# Patient Record
Sex: Female | Born: 1960 | Race: White | Hispanic: No | Marital: Married | Smoking: Never smoker
Health system: Southern US, Community
[De-identification: ages and names within clinical notes are randomized; demographics above are authoritative.]

## PROBLEM LIST (undated history)

## (undated) DIAGNOSIS — H532 Diplopia: Secondary | ICD-10-CM

## (undated) DIAGNOSIS — I10 Essential (primary) hypertension: Secondary | ICD-10-CM

## (undated) DIAGNOSIS — R87619 Unspecified abnormal cytological findings in specimens from cervix uteri: Secondary | ICD-10-CM

## (undated) DIAGNOSIS — R51 Headache: Secondary | ICD-10-CM

## (undated) DIAGNOSIS — R519 Headache, unspecified: Secondary | ICD-10-CM

## (undated) DIAGNOSIS — G43109 Migraine with aura, not intractable, without status migrainosus: Secondary | ICD-10-CM

## (undated) DIAGNOSIS — K5792 Diverticulitis of intestine, part unspecified, without perforation or abscess without bleeding: Secondary | ICD-10-CM

## (undated) DIAGNOSIS — E785 Hyperlipidemia, unspecified: Secondary | ICD-10-CM

## (undated) HISTORY — PX: ABDOMINAL HYSTERECTOMY: SHX81

## (undated) HISTORY — PX: BREAST SURGERY: SHX581

## (undated) HISTORY — PX: CERVICAL CONE BIOPSY: SUR198

## (undated) HISTORY — PX: REDUCTION MAMMAPLASTY: SUR839

## (undated) HISTORY — DX: Unspecified abnormal cytological findings in specimens from cervix uteri: R87.619

## (undated) HISTORY — PX: OTHER SURGICAL HISTORY: SHX169

## (undated) HISTORY — PX: TONSILLECTOMY: SUR1361

## (undated) HISTORY — DX: Headache, unspecified: R51.9

## (undated) HISTORY — DX: Diverticulitis of intestine, part unspecified, without perforation or abscess without bleeding: K57.92

## (undated) HISTORY — DX: Essential (primary) hypertension: I10

## (undated) HISTORY — PX: TUBAL LIGATION: SHX77

## (undated) HISTORY — DX: Diplopia: H53.2

## (undated) HISTORY — DX: Headache: R51

## (undated) HISTORY — DX: Migraine with aura, not intractable, without status migrainosus: G43.109

## (undated) HISTORY — DX: Hyperlipidemia, unspecified: E78.5

---

## 1998-02-27 ENCOUNTER — Other Ambulatory Visit: Admission: RE | Admit: 1998-02-27 | Discharge: 1998-02-27 | Payer: Self-pay | Admitting: Obstetrics and Gynecology

## 1998-03-11 ENCOUNTER — Ambulatory Visit (HOSPITAL_COMMUNITY): Admission: RE | Admit: 1998-03-11 | Discharge: 1998-03-11 | Payer: Self-pay | Admitting: Obstetrics and Gynecology

## 1998-03-11 ENCOUNTER — Encounter: Payer: Self-pay | Admitting: Obstetrics and Gynecology

## 1998-03-20 ENCOUNTER — Ambulatory Visit (HOSPITAL_COMMUNITY): Admission: RE | Admit: 1998-03-20 | Discharge: 1998-03-20 | Payer: Self-pay | Admitting: Obstetrics and Gynecology

## 1998-06-09 ENCOUNTER — Inpatient Hospital Stay (HOSPITAL_COMMUNITY): Admission: RE | Admit: 1998-06-09 | Discharge: 1998-06-11 | Payer: Self-pay | Admitting: Obstetrics and Gynecology

## 1999-02-17 ENCOUNTER — Encounter: Payer: Self-pay | Admitting: Gastroenterology

## 1999-02-17 ENCOUNTER — Observation Stay (HOSPITAL_COMMUNITY): Admission: AD | Admit: 1999-02-17 | Discharge: 1999-02-18 | Payer: Self-pay | Admitting: Gastroenterology

## 2001-03-16 ENCOUNTER — Other Ambulatory Visit: Admission: RE | Admit: 2001-03-16 | Discharge: 2001-03-16 | Payer: Self-pay | Admitting: Obstetrics and Gynecology

## 2001-10-31 ENCOUNTER — Encounter: Admission: RE | Admit: 2001-10-31 | Discharge: 2001-10-31 | Payer: Self-pay | Admitting: Obstetrics and Gynecology

## 2001-10-31 ENCOUNTER — Encounter: Payer: Self-pay | Admitting: Obstetrics and Gynecology

## 2002-10-02 ENCOUNTER — Encounter: Payer: Self-pay | Admitting: Diagnostic Radiology

## 2002-10-02 ENCOUNTER — Encounter: Admission: RE | Admit: 2002-10-02 | Discharge: 2002-10-02 | Payer: Self-pay | Admitting: Diagnostic Radiology

## 2003-05-15 ENCOUNTER — Encounter: Admission: RE | Admit: 2003-05-15 | Discharge: 2003-05-15 | Payer: Self-pay | Admitting: Dermatology

## 2003-12-24 ENCOUNTER — Encounter: Admission: RE | Admit: 2003-12-24 | Discharge: 2003-12-24 | Payer: Self-pay | Admitting: Orthopedic Surgery

## 2004-01-09 ENCOUNTER — Ambulatory Visit (HOSPITAL_BASED_OUTPATIENT_CLINIC_OR_DEPARTMENT_OTHER): Admission: RE | Admit: 2004-01-09 | Discharge: 2004-01-09 | Payer: Self-pay | Admitting: Orthopedic Surgery

## 2004-04-06 ENCOUNTER — Encounter: Admission: RE | Admit: 2004-04-06 | Discharge: 2004-04-06 | Payer: Self-pay | Admitting: Obstetrics and Gynecology

## 2004-04-08 ENCOUNTER — Encounter (INDEPENDENT_AMBULATORY_CARE_PROVIDER_SITE_OTHER): Payer: Self-pay | Admitting: *Deleted

## 2004-04-08 ENCOUNTER — Encounter: Admission: RE | Admit: 2004-04-08 | Discharge: 2004-04-08 | Payer: Self-pay | Admitting: Obstetrics and Gynecology

## 2005-03-10 ENCOUNTER — Encounter: Admission: RE | Admit: 2005-03-10 | Discharge: 2005-03-10 | Payer: Self-pay | Admitting: Dermatology

## 2005-04-14 ENCOUNTER — Encounter: Admission: RE | Admit: 2005-04-14 | Discharge: 2005-04-14 | Payer: Self-pay | Admitting: Dermatology

## 2005-04-20 ENCOUNTER — Encounter: Admission: RE | Admit: 2005-04-20 | Discharge: 2005-04-20 | Payer: Self-pay | Admitting: Dermatology

## 2005-04-28 ENCOUNTER — Encounter: Admission: RE | Admit: 2005-04-28 | Discharge: 2005-04-28 | Payer: Self-pay | Admitting: Internal Medicine

## 2005-05-12 ENCOUNTER — Encounter: Admission: RE | Admit: 2005-05-12 | Discharge: 2005-05-12 | Payer: Self-pay | Admitting: Dermatology

## 2006-05-10 ENCOUNTER — Encounter: Admission: RE | Admit: 2006-05-10 | Discharge: 2006-05-10 | Payer: Self-pay | Admitting: Internal Medicine

## 2006-05-30 ENCOUNTER — Other Ambulatory Visit: Admission: RE | Admit: 2006-05-30 | Discharge: 2006-05-30 | Payer: Self-pay | Admitting: Internal Medicine

## 2006-05-30 LAB — HM PAP SMEAR

## 2007-05-15 ENCOUNTER — Encounter: Admission: RE | Admit: 2007-05-15 | Discharge: 2007-05-15 | Payer: Self-pay | Admitting: Internal Medicine

## 2008-05-21 ENCOUNTER — Encounter: Admission: RE | Admit: 2008-05-21 | Discharge: 2008-05-21 | Payer: Self-pay | Admitting: Internal Medicine

## 2008-05-22 ENCOUNTER — Ambulatory Visit: Payer: Self-pay | Admitting: Internal Medicine

## 2008-07-24 ENCOUNTER — Ambulatory Visit: Payer: Self-pay | Admitting: Internal Medicine

## 2008-11-24 ENCOUNTER — Telehealth: Payer: Self-pay | Admitting: Cardiology

## 2008-11-26 ENCOUNTER — Telehealth (INDEPENDENT_AMBULATORY_CARE_PROVIDER_SITE_OTHER): Payer: Self-pay | Admitting: *Deleted

## 2008-11-27 ENCOUNTER — Ambulatory Visit: Payer: Self-pay | Admitting: Cardiology

## 2008-11-27 ENCOUNTER — Encounter: Payer: Self-pay | Admitting: Cardiology

## 2008-11-27 DIAGNOSIS — E782 Mixed hyperlipidemia: Secondary | ICD-10-CM | POA: Insufficient documentation

## 2008-11-28 LAB — CONVERTED CEMR LAB
ALT: 18 units/L (ref 0–35)
AST: 23 units/L (ref 0–37)
Albumin: 4.5 g/dL (ref 3.5–5.2)
Alkaline Phosphatase: 55 units/L (ref 39–117)
BUN: 13 mg/dL (ref 6–23)
Basophils Absolute: 0 10*3/uL (ref 0.0–0.1)
Basophils Relative: 0.7 % (ref 0.0–3.0)
Bilirubin, Direct: 0.1 mg/dL (ref 0.0–0.3)
CK-MB: 0.9 ng/mL (ref 0.3–4.0)
CO2: 28 meq/L (ref 19–32)
Calcium: 9.8 mg/dL (ref 8.4–10.5)
Chloride: 108 meq/L (ref 96–112)
Cholesterol: 215 mg/dL — ABNORMAL HIGH (ref 0–200)
Creatinine, Ser: 0.9 mg/dL (ref 0.4–1.2)
Direct LDL: 130.7 mg/dL
Eosinophils Absolute: 0 10*3/uL (ref 0.0–0.7)
Eosinophils Relative: 0.8 % (ref 0.0–5.0)
GFR calc non Af Amer: 71.08 mL/min (ref >60)
Glucose, Bld: 109 mg/dL — ABNORMAL HIGH (ref 70–99)
HCT: 40.4 % (ref 36.0–46.0)
HDL: 65.5 mg/dL (ref >39.00)
Hemoglobin: 13.8 g/dL (ref 12.0–15.0)
Lymphocytes Relative: 27.8 % (ref 12.0–46.0)
Lymphs Abs: 1.4 10*3/uL (ref 0.7–4.0)
MCHC: 34.1 g/dL (ref 30.0–36.0)
MCV: 92.1 fL (ref 78.0–100.0)
Monocytes Absolute: 0.3 10*3/uL (ref 0.1–1.0)
Monocytes Relative: 6.3 % (ref 3.0–12.0)
Neutro Abs: 3.2 10*3/uL (ref 1.4–7.7)
Neutrophils Relative %: 64.4 % (ref 43.0–77.0)
Platelets: 149 10*3/uL — ABNORMAL LOW (ref 150.0–400.0)
Potassium: 4.7 meq/L (ref 3.5–5.1)
RBC: 4.39 M/uL (ref 3.87–5.11)
RDW: 12.6 % (ref 11.5–14.6)
Sed Rate: 16 mm/hr (ref 0–22)
Sodium: 143 meq/L (ref 135–145)
TSH: 2.33 microintl units/mL (ref 0.35–5.50)
Total Bilirubin: 1.1 mg/dL (ref 0.3–1.2)
Total CHOL/HDL Ratio: 3
Total Protein: 7.2 g/dL (ref 6.0–8.3)
Triglycerides: 133 mg/dL (ref 0.0–149.0)
VLDL: 26.6 mg/dL (ref 0.0–40.0)
WBC: 4.9 10*3/uL (ref 4.5–10.5)

## 2008-12-08 ENCOUNTER — Encounter: Payer: Self-pay | Admitting: Cardiology

## 2008-12-08 ENCOUNTER — Ambulatory Visit: Payer: Self-pay

## 2009-02-27 ENCOUNTER — Ambulatory Visit: Payer: Self-pay | Admitting: Internal Medicine

## 2009-04-09 ENCOUNTER — Ambulatory Visit: Payer: Self-pay | Admitting: Internal Medicine

## 2009-04-09 ENCOUNTER — Encounter: Admission: RE | Admit: 2009-04-09 | Discharge: 2009-04-09 | Payer: Self-pay | Admitting: Neurology

## 2009-05-11 ENCOUNTER — Ambulatory Visit: Payer: Self-pay | Admitting: Internal Medicine

## 2009-05-27 ENCOUNTER — Encounter: Admission: RE | Admit: 2009-05-27 | Discharge: 2009-05-27 | Payer: Self-pay | Admitting: Internal Medicine

## 2009-05-27 LAB — HM MAMMOGRAPHY

## 2009-08-17 ENCOUNTER — Ambulatory Visit: Payer: Self-pay | Admitting: Internal Medicine

## 2009-09-08 ENCOUNTER — Ambulatory Visit: Payer: Self-pay | Admitting: Internal Medicine

## 2009-10-05 ENCOUNTER — Ambulatory Visit: Payer: Self-pay | Admitting: Internal Medicine

## 2010-01-11 ENCOUNTER — Ambulatory Visit: Payer: Self-pay | Admitting: Vascular Surgery

## 2010-05-13 ENCOUNTER — Ambulatory Visit: Admit: 2010-05-13 | Payer: Self-pay | Admitting: Vascular Surgery

## 2010-09-14 NOTE — Consult Note (Signed)
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  She has had  previous procedure performed at [facility] by Dr. Ruel Favors  approximately 3 years ago.  I am not certain exactly what procedure was  performed but it sounds like a laser ablation of the left small  saphenous vein.  She has had worsening of her varicosities recently both  in the left mid calf area which is where the majority of her pain is  located but also has had some prominent varicosities noted in the right  mid calf.  She has had no history of swelling, bleeding, ulceration,  thrombophlebitis, deep venous thrombosis or other complications.  She  does not wear elastic compression stockings and states that elevation of  the legs does improve her symptoms.   CHRONIC MEDICAL PROBLEMS:  1. Hypertension.  2. Hyperlipidemia.  3. Negative for coronary artery disease, diabetes, COPD or stroke.   SOCIAL HISTORY:  She is married, has 2 children.  Does not use tobacco.  Drinks occasional alcohol.   FAMILY HISTORY:  Positive for severe PVD in her brother, coronary artery  disease and diabetes in the father and negative for stroke.   REVIEW OF SYSTEMS:  Totally negative, healthy middle-aged female with no  complaints.   PHYSICAL EXAM:  Vital signs:  Blood pressure was 142/99, heart rate 81,  respirations 24.  General:  She is a well-developed, well-nourished  female in no apparent stress, alert and oriented x3.  HEENT:  Exam is  normal.  EOMs intact.  Lungs:  Clear to auscultation.  No rhonchi or  wheezing.  Cardiovascular:  Regular rhythm.  No murmurs.  Carotid pulses  3+, no bruits.  Abdomen:  Soft, nontender with no masses.  Musculoskeletal:  Exam is free of major deformities.   Neurologic:  Normal.  Skin:  Free of rashes.  Extremities:  Lower extremity exam  reveals 2 bulging varicosities over the great saphenous system between  the knee and the ankle each measuring about 0.5 to 1 cm in diameter.  Left leg has some bulging varicosities in the posterior aspect of the  left calf.  These appear to be more related to the small saphenous  system.  She also has a reticular vein in the lateral aspect of the left  calf.  There is no hyperpigmentation or edema in either leg.   I have ordered a venous duplex exam which I have reviewed and  interpreted.  She does have reflux in the deep system on the right and  the left, more severe on the right.  She has reflux at the  saphenofemoral junction in both legs but not throughout the great  saphenous systems in both legs.  Both small saphenous veins are free of  reflux.   I do not see any underlying area to treat with laser ablation.  I think  the best treatment at this time would be primary sclerotherapy.  I did  explain to her that she could develop recurrences down the road as she  does have reflux in both saphenofemoral junctions and could develop  reflux throughout the  great saphenous systems at a later date and need  laser ablation.  Presently I would recommend primary sclerotherapy for  these areas and she will consider that.     [name] [name] [place], M.D.  Electronically Signed   JDL/MEDQ  D:  01/11/2010  T:  01/12/2010  Job:  [id number]

## 2010-09-14 NOTE — Procedures (Signed)
LOWER EXTREMITY VENOUS REFLUX EXAM   INDICATION:  Painful varicose veins.   EXAM:  Using color-flow imaging and pulse Doppler spectral analysis, the  right and left common femoral, superficial femoral, popliteal, posterior  tibial, greater and lesser saphenous veins are evaluated.  There is  evidence suggesting deep venous insufficiency in the right and left  lower extremities.   The right and left saphenofemoral junctions are not competent with  Reflux of >592milliseconds. The right and left GSV is competent.   The right and left proximal short saphenous veins demonstrate  competency.   GSV Diameter (used if found to be incompetent only)                                            Right    Left  Proximal Greater Saphenous Vein           0.85 cm  0.50 cm  Proximal-to-mid-thigh                     cm       cm  Mid thigh                                 0.41 cm  cm  Mid-distal thigh                          cm       cm  Distal thigh                              0.46 cm  cm  Knee                                      0.35 cm  cm   IMPRESSION:  1. Right and left greater saphenous veins are not competent with      reflux of >500 milliseconds.  2. The right and left greater saphenous veins are not tortuous.  3. The deep venous system is not competent with Reflux of      >556milliseconds.  4. The right and left lesser saphenous veins are competent.   ___________________________________________  Quita Skye. Hart Rochester, M.D.   EM/MEDQ  D:  01/11/2010  T:  01/11/2010  Job:  [id number]

## 2010-09-17 NOTE — Op Note (Signed)
NAME:  [name] [name], [name] [name]                    ACCOUNT NO.:  [id number]   MEDICAL RECORD NO.:  [id number]                   PATIENT TYPE:  AMB   LOCATION:  DSC                                  FACILITY:  [facility]   PHYSICIAN:  Katy Fitch. Naaman Plummer., M.D.          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   POSTOPERATIVE DIAGNOSIS:  1.  Chronic right lateral elbow pain due to chronic extensor origin      tendonopathy with MRI evidence of significant tendonopathy and partial      rupture of extensor carpi radialis brevis tendon origin and suggestion      of synovitis and early degenerative arthritis radial capitellar      articulation posterolateral right elbow with identification of chronic      extensor carpi radialis brevis tendonopathy with partial avulsion from      common extensor origin and reactive new bone formation at lateral      epicondyle.   OPERATION:  Arthrotomy of right elbow joint with limited synovectomy and  identification of early marginal osteophyte growth on posterior capitellum  at radial capitellar articulation.   SURGEON:  Katy Fitch. Sypher, M.D.   ASSISTANT:  None.   ANESTHESIA:  General by LMA.   ANESTHESIOLOGIST:  Kaylyn Layer. Michelle Piper, M.D.   INDICATIONS FOR PROCEDURE:  [name] [name] is a 50 year old right hand dominant  homemaker who is an active recreational athlete.  She enjoys playing tennis  and golf.  For the past year, she has had intermittent episodes of right  lateral elbow pain.  We saw her for evaluation in December 2004 at  which  time she reported a 3-4 month history of right lateral elbow pain.  She had  all the positive signs of lateral epicondylitis and was advised to begin a  program of stretching and modified lifting activity.  She tried a period of  rest from tennis with partial relief.  She stopped her golf and rested her  elbow through the spring of 2005 and recently, in the summer 2005, attempted  to play golf and tennis again and noted severe elbow pain.  This lead her to  obtain an MRI of her right elbow which is interpreted by Dr. Jerald Kief to  reveal evidence of severe epicondylitis and partial tendon rupture.  She  came for a follow up consultation on December 31, 2003, reporting chronic  disabling elbow pain that had been present for more than 11 months.  We  reviewed her plane films which demonstrated a reactive osteophyte at the  right lateral epicondyle, her clinical  examination revealed signs of severe  pain on palpation at the extensor carpi radialis brevis origin and all  provocative signs of extensor tendonopathy were present.  The MRI did,  indeed, reveal severe epicondylitis in what appeared to be a deep surface  partial avulsion of the extensor carpi radialis brevis tendon proximal to  the capitellum.  Due to her chronic pain, I recommended consideration of  elbow debridement and reattachment of the extensor carpi radialis brevis and  longus after decortication and drilling.  Preoperatively, she was advised of  the potential risks and benefits of surgery.  We cannot guarantee complete  pain relief.  There are minor risks of infection and possible anesthetic  complications although these are clearly less than 1%.  After informed  consent, Ms. Corzine is brought to the operating room at this time  anticipating lateral elbow reconstruction.   PROCEDURE:  [name] is brought to the operating room and placed on  supine position on the operating table.  Following anesthesia consultation   by Dr. Michelle Piper, general anesthesia by LMA was induced.  1 gram Ancef was  administered as an IV prophylactic antibiotic.  The procedure commenced with  exsanguination of the right arm with an Esmarch bandage and inflation of  arterial tourniquet to 230 mmHg.  A 3 cm incision posterior to the  epicondyle overlying the leading edge of the muscle was made.  The  subcutaneous tissues were carefully divided identifying the posterior margin  of the extensor carpi radialis longus.  The fascia was incised and the  extensor carpi radialis longus and brevis muscles were identified.  The  periosteum at the proximal epicondyle was incised with a scalpel and the  extensor carpi radialis longus and brevis origins were elevated with a short  osteotome.  The tendon substance was quite necrotic in the deep portion of  the extensor carpi radialis brevis.  There was fibrillation and joint fluid  present.  The entire common extensor origin was exposed extending anteriorly  to the brachial radialis origin.  The capitellum was identified and the  posterior aspect of the capitellum was notable for a small osteophyte  forming and some reactive synovitis.  The synovitis was removed with a  rongeur.  The capsule of the elbow joint appeared, otherwise, normal.  No  loose bodies or other predicaments were noted.  The epicondyle was then  cleaned of its osteophyte with a small rongeur followed by drilling  approximately 30 times with a 0.035 inch Kirschner wire through the cortex  to access intramedullary blood supply.  The necrotic tendon was debrided  with a rongeur followed by repair of the tendon with a total of three  mattress sutures of 3-0 FiberWire through bone so as to create a broad foot  print of repair of the brevis and longus muscles to the bone.  A finishing  suture of 4-0 FiberWire was used inverting the edge of the repair for smooth contour.  The wound was then thoroughly lavaged with sterile saline  followed  by repair of the skin with subdermal sutures of 4-0 Vicryl and intradermal 3-  0 Prolene with Steri-Strips.  A compressive dressing was applied with a  volar plaster splint on the wrist maintaining the wrist in 40 degrees  dorsiflexion.  The elbow was dressed with sterile gauze, Tegaderm, and an  Ace wrap.  There were no apparent complications.  For aftercare, Ms. Karen  was advised to begin immediate elbow flexion extension exercises.  She will  not do any lifting or activity with her hand for approximately three weeks.  We will remove the plaster splint at approximately ten days and advance to a  wrist forearm splint.  She is given prescriptions of Motrin 600 mg 1 p.o.  q.6h. p.r.n. pain, 20 tablets without refill, also Keflex 500 mg 1 p.o.  q.8h. x 4 days as a prophylactic antibiotic, and Percocet 5 mg 1-2 tablets  p.o. q.4-6h. p.r.n. pain, 30 tablets without refill.                                               Katy Fitch Naaman Plummer., M.D.    RVS/MEDQ  D:  01/09/2004  T:  01/09/2004  Job:  [id number]

## 2010-10-06 ENCOUNTER — Other Ambulatory Visit: Payer: Self-pay | Admitting: Internal Medicine

## 2010-10-06 DIAGNOSIS — Z1231 Encounter for screening mammogram for malignant neoplasm of breast: Secondary | ICD-10-CM

## 2010-10-14 ENCOUNTER — Ambulatory Visit
Admission: RE | Admit: 2010-10-14 | Discharge: 2010-10-14 | Disposition: A | Discharge status: Home / Self Care | Payer: PRIVATE HEALTH INSURANCE | Source: Ambulatory Visit | Attending: Internal Medicine | Admitting: Internal Medicine

## 2010-10-14 ENCOUNTER — Ambulatory Visit: Payer: Self-pay

## 2010-10-14 DIAGNOSIS — Z1231 Encounter for screening mammogram for malignant neoplasm of breast: Secondary | ICD-10-CM

## 2010-10-26 ENCOUNTER — Other Ambulatory Visit: Payer: Self-pay | Admitting: Internal Medicine

## 2010-11-08 ENCOUNTER — Other Ambulatory Visit: Payer: Self-pay | Admitting: Internal Medicine

## 2011-03-21 ENCOUNTER — Other Ambulatory Visit: Payer: Self-pay | Admitting: Internal Medicine

## 2011-06-13 ENCOUNTER — Encounter: Payer: Self-pay | Admitting: Internal Medicine

## 2011-06-14 ENCOUNTER — Ambulatory Visit (INDEPENDENT_AMBULATORY_CARE_PROVIDER_SITE_OTHER): Payer: PRIVATE HEALTH INSURANCE | Admitting: Internal Medicine

## 2011-06-14 ENCOUNTER — Encounter: Payer: Self-pay | Admitting: Internal Medicine

## 2011-06-14 VITALS — BP 148/108 | HR 84 | Temp 98.8°F

## 2011-06-14 DIAGNOSIS — I10 Essential (primary) hypertension: Secondary | ICD-10-CM

## 2011-06-14 DIAGNOSIS — E785 Hyperlipidemia, unspecified: Secondary | ICD-10-CM

## 2011-06-14 DIAGNOSIS — Z8669 Personal history of other diseases of the nervous system and sense organs: Secondary | ICD-10-CM

## 2011-06-14 DIAGNOSIS — F39 Unspecified mood [affective] disorder: Secondary | ICD-10-CM

## 2011-06-14 LAB — COMPREHENSIVE METABOLIC PANEL
AST: 30 U/L (ref 0–37)
Albumin: 4.7 g/dL (ref 3.5–5.2)
Alkaline Phosphatase: 90 U/L (ref 39–117)
Chloride: 102 mEq/L (ref 96–112)
Glucose, Bld: 92 mg/dL (ref 70–99)
Potassium: 4.9 mEq/L (ref 3.5–5.3)
Sodium: 139 mEq/L (ref 135–145)
Total Protein: 6.7 g/dL (ref 6.0–8.3)

## 2011-06-14 LAB — CBC WITH DIFFERENTIAL/PLATELET
Basophils Absolute: 0 10*3/uL (ref 0.0–0.1)
Basophils Relative: 1 % (ref 0–1)
Hemoglobin: 13.6 g/dL (ref 12.0–15.0)
Lymphocytes Relative: 22 % (ref 12–46)
MCHC: 34.3 g/dL (ref 30.0–36.0)
Monocytes Relative: 9 % (ref 3–12)
Neutro Abs: 3.5 10*3/uL (ref 1.7–7.7)
Neutrophils Relative %: 67 % (ref 43–77)
RBC: 4.31 MIL/uL (ref 3.87–5.11)
WBC: 5.2 10*3/uL (ref 4.0–10.5)

## 2011-06-14 LAB — LIPID PANEL
LDL Cholesterol: 183 mg/dL — ABNORMAL HIGH (ref 0–99)
VLDL: 35 mg/dL (ref 0–40)

## 2011-06-15 ENCOUNTER — Other Ambulatory Visit: Payer: Self-pay | Admitting: Internal Medicine

## 2011-06-24 ENCOUNTER — Ambulatory Visit (INDEPENDENT_AMBULATORY_CARE_PROVIDER_SITE_OTHER): Payer: PRIVATE HEALTH INSURANCE | Admitting: Internal Medicine

## 2011-06-24 ENCOUNTER — Encounter: Payer: Self-pay | Admitting: Internal Medicine

## 2011-06-24 DIAGNOSIS — F419 Anxiety disorder, unspecified: Secondary | ICD-10-CM

## 2011-06-24 DIAGNOSIS — I10 Essential (primary) hypertension: Secondary | ICD-10-CM

## 2011-06-24 DIAGNOSIS — F411 Generalized anxiety disorder: Secondary | ICD-10-CM

## 2011-06-24 DIAGNOSIS — F39 Unspecified mood [affective] disorder: Secondary | ICD-10-CM

## 2011-06-24 NOTE — Progress Notes (Signed)
  Subjective:    Patient ID: [name] [name], female    DOB: [birth year], 51 y.o.   MRN: [id number]  HPI Her sister is dying of metastatic bile duct cancer. She is going to have to leave right away to travel to [place] to drive her back to [place]. She has gotten acutely deal. Her blood pressure has improved since starting Benicar. Lipids remain high. 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 Doesn't tolerate statin medication well. Gave her samples of Crestor 5 mg daily to try. Suggested she return in 3 months to have fasting lipid panel liver functions if she can tolerate Crestor.    Review of Systems     Objective:   Physical Exam chest clear to auscultation; cardiac exam regular rate and rhythm; extremities without edema        Assessment & Plan:  Hypertension  Hyperlipidemia  Anxiety  Plan: Xanax 0.5 mg #60 one half to one by mouth twice daily as needed for anxiety continue Benicar  Try Crestor 5 mg daily return in 3 months for lipid panel, liver functions, office visit. Will not do lab work if she is unable to tolerate Crestor and is not taking it.

## 2011-07-03 DIAGNOSIS — I10 Essential (primary) hypertension: Secondary | ICD-10-CM | POA: Insufficient documentation

## 2011-07-03 DIAGNOSIS — F39 Unspecified mood [affective] disorder: Secondary | ICD-10-CM | POA: Insufficient documentation

## 2011-07-03 DIAGNOSIS — Z8669 Personal history of other diseases of the nervous system and sense organs: Secondary | ICD-10-CM | POA: Insufficient documentation

## 2011-07-03 NOTE — Progress Notes (Signed)
  Subjective:    Patient ID: [name] [name], female    DOB: [birth year], 51 y.o.   MRN: [id number]  HPI Patient called a few days ago and said her blood pressure had been markedly elevated. She used to take Norvasc but had stopped taking it for a while. We've not seen her in some time. Patient has a history of hyperlipidemia but is unable to tolerate lipid-lowering medication. Patient's sister has metastatic cancer which may have started in the bile duct. This is been stressful for her.    Review of Systems     Objective:   Physical Exam funduscopic exam shows a disc be sharp and flat bilaterally. Neck is supple without JVD thyromegaly or carotid bruits. Chest clear to auscultation. Cardiac exam regular rate and rhythm normal S1 and S2. Extremities without edema.        Assessment & Plan:   Hypertension  History of hyperlipidemia-intolerant of statin medication  History of mood disorder  History of migraine headaches  Plan: Patient recently started Benicar. Is to return in 2 weeks for followup.

## 2011-07-03 NOTE — Patient Instructions (Signed)
Take Benicar daily. Return in 2 weeks.

## 2011-07-10 DIAGNOSIS — F419 Anxiety disorder, unspecified: Secondary | ICD-10-CM | POA: Insufficient documentation

## 2011-07-10 NOTE — Patient Instructions (Signed)
Take Xanax as needed for anxiety. Continue with Benicar daily. Try Crestor 5 mg daily. Return in 3 months. If you are taking Crestor in 3 months with compliance we will do fasting lipid panel liver functions when he returned.

## 2011-08-01 ENCOUNTER — Encounter: Payer: Self-pay | Admitting: Internal Medicine

## 2011-09-22 ENCOUNTER — Other Ambulatory Visit: Payer: PRIVATE HEALTH INSURANCE | Admitting: Internal Medicine

## 2011-09-23 ENCOUNTER — Ambulatory Visit: Payer: PRIVATE HEALTH INSURANCE | Admitting: Internal Medicine

## 2011-10-10 ENCOUNTER — Other Ambulatory Visit: Payer: Self-pay | Admitting: Internal Medicine

## 2011-10-10 ENCOUNTER — Other Ambulatory Visit: Payer: PRIVATE HEALTH INSURANCE | Admitting: Internal Medicine

## 2011-10-10 DIAGNOSIS — Z79899 Other long term (current) drug therapy: Secondary | ICD-10-CM

## 2011-10-10 LAB — HEPATIC FUNCTION PANEL
Bilirubin, Direct: 0.1 mg/dL (ref 0.0–0.3)
Total Bilirubin: 0.4 mg/dL (ref 0.3–1.2)

## 2011-10-11 ENCOUNTER — Encounter: Payer: Self-pay | Admitting: Internal Medicine

## 2011-10-11 ENCOUNTER — Ambulatory Visit (INDEPENDENT_AMBULATORY_CARE_PROVIDER_SITE_OTHER): Payer: PRIVATE HEALTH INSURANCE | Admitting: Internal Medicine

## 2011-10-11 VITALS — BP 134/88 | Ht 65.0 in | Wt 158.0 lb

## 2011-10-11 DIAGNOSIS — E785 Hyperlipidemia, unspecified: Secondary | ICD-10-CM

## 2011-10-11 DIAGNOSIS — I10 Essential (primary) hypertension: Secondary | ICD-10-CM

## 2011-10-11 LAB — LIPID PANEL
HDL: 72 mg/dL (ref >39)
LDL Cholesterol: 193 mg/dL — ABNORMAL HIGH (ref 0–99)
Total CHOL/HDL Ratio: 4 Ratio
Triglycerides: 110 mg/dL (ref <150)
VLDL: 22 mg/dL (ref 0–40)

## 2011-10-11 NOTE — Progress Notes (Signed)
  Subjective:    Patient ID: [name] [name], female    DOB: Mar 19, 1961, 51 y.o.   MRN: [id number]  HPI Patient here today to followup on hypertension and hyperlipidemia. Unfortunately her lipid panel results are not back yet. She has a history of hyperlipidemia and is not able to tolerate statin medications. She stopped taking Benicar recently because she read an article in the [employer] saying that there was an 11% increase in malignancies with Benicar. She is only taking amlodipine. Blood pressure continues to be labile at times-- she says it can be as high as 140/90.    Review of Systems     Objective:   Physical Exam chest clear to auscultation; cardiac exam regular rate and rhythm; extremities without edema           Assessment & Plan:  Hypertension-labile currently only taking amlodipine  Hyperlipidemia  Plan: Add Maxide 25 daily to amlodipine. Advised patient of lipid panel results tomorrow. Return in 4-6 weeks.

## 2011-10-11 NOTE — Patient Instructions (Signed)
Add Maxide 25 to amlodipine. Return in 6 weeks. B- met will be drawn at that time.

## 2011-11-29 ENCOUNTER — Ambulatory Visit (INDEPENDENT_AMBULATORY_CARE_PROVIDER_SITE_OTHER): Payer: PRIVATE HEALTH INSURANCE | Admitting: Internal Medicine

## 2011-11-29 VITALS — BP 126/100 | HR 80

## 2011-11-29 DIAGNOSIS — I10 Essential (primary) hypertension: Secondary | ICD-10-CM

## 2011-11-29 LAB — POTASSIUM: Potassium: 5.1 mEq/L (ref 3.5–5.3)

## 2011-11-30 ENCOUNTER — Encounter: Payer: Self-pay | Admitting: Internal Medicine

## 2011-11-30 NOTE — Patient Instructions (Addendum)
Continue Maxide 25 and amlodipine daily. Continue to monitor blood pressure at home. Call if diastolic blood pressure remains elevated over 90 consistently. Otherwise return in 6 months.

## 2011-11-30 NOTE — Progress Notes (Signed)
  Subjective:    Patient ID: [name] [name], female    DOB: 1961/04/22, 51 y.o.   MRN: [id number]  HPI recently seen for hypertension and she wanted to come off of Benicar because she read it could possibly be associated with cancer. Now on diuretic. Wants to have potassium checked. She is on amlodipine 5 mg daily Maxide 25 daily.    Review of Systems     Objective:   Physical Exam blood pressure is elevated today at 100 diastolically. This is unusual for her. Says it's been running quite well at home. Serum potassium drawn and is pending.        Assessment & Plan:  Hypertension-on diuretic therapy  Hyperlipidemia-does not tolerate any lipid lowering agent  Plan: Continue diuretic, serum potassium checked. Continue to monitor blood pressure at home and call if consistently elevated diastolically as it is today.  Addendum: Serum potassium 5.1

## 2012-01-08 ENCOUNTER — Other Ambulatory Visit: Payer: Self-pay | Admitting: Internal Medicine

## 2012-02-10 ENCOUNTER — Other Ambulatory Visit: Payer: Self-pay | Admitting: Internal Medicine

## 2012-03-20 ENCOUNTER — Other Ambulatory Visit: Payer: Self-pay | Admitting: Internal Medicine

## 2012-04-17 ENCOUNTER — Other Ambulatory Visit: Payer: Self-pay | Admitting: Internal Medicine

## 2012-04-17 DIAGNOSIS — Z1231 Encounter for screening mammogram for malignant neoplasm of breast: Secondary | ICD-10-CM

## 2012-05-16 ENCOUNTER — Ambulatory Visit
Admission: RE | Admit: 2012-05-16 | Discharge: 2012-05-16 | Disposition: A | Discharge status: Home / Self Care | Payer: PRIVATE HEALTH INSURANCE | Source: Ambulatory Visit | Attending: Internal Medicine | Admitting: Internal Medicine

## 2012-05-16 DIAGNOSIS — Z1231 Encounter for screening mammogram for malignant neoplasm of breast: Secondary | ICD-10-CM

## 2012-05-22 ENCOUNTER — Other Ambulatory Visit: Payer: Self-pay | Admitting: Internal Medicine

## 2012-05-22 DIAGNOSIS — R928 Other abnormal and inconclusive findings on diagnostic imaging of breast: Secondary | ICD-10-CM

## 2012-05-23 ENCOUNTER — Ambulatory Visit
Admission: RE | Admit: 2012-05-23 | Discharge: 2012-05-23 | Disposition: A | Discharge status: Home / Self Care | Payer: PRIVATE HEALTH INSURANCE | Source: Ambulatory Visit | Attending: Internal Medicine | Admitting: Internal Medicine

## 2012-05-23 ENCOUNTER — Other Ambulatory Visit: Payer: Self-pay | Admitting: Internal Medicine

## 2012-05-23 DIAGNOSIS — R928 Other abnormal and inconclusive findings on diagnostic imaging of breast: Secondary | ICD-10-CM

## 2012-06-04 ENCOUNTER — Telehealth: Payer: Self-pay | Admitting: Internal Medicine

## 2012-06-04 NOTE — Telephone Encounter (Signed)
[facility] and subsequently underwent biopsy which was consistent with sarcoidosis. She called today asking for referral to pulmonologist. She should have chest x-ray which I don't think has been done and CT of the chest with contrast.

## 2012-06-05 ENCOUNTER — Telehealth: Payer: Self-pay

## 2012-06-05 DIAGNOSIS — D869 Sarcoidosis, unspecified: Secondary | ICD-10-CM

## 2012-06-05 NOTE — Telephone Encounter (Signed)
Patient calls stating she had a lymph node biopsy come back with a diagnosis of sarcoidosis. Wants referral to pulmonologist, and has chosen Dr. Danise Mina. Does not want a CT scan of the chest, but has agreed to have a chest x-ray. Referral to be done

## 2012-06-06 ENCOUNTER — Telehealth: Payer: Self-pay

## 2012-06-06 NOTE — Telephone Encounter (Signed)
Patient scheduled for an appointment with Dr. Danise Mina on 07/25/2012 at 3:30pm. She is aware of this. Will do a chest xray prior to this.

## 2012-06-18 ENCOUNTER — Other Ambulatory Visit: Payer: Self-pay | Admitting: Internal Medicine

## 2012-06-18 ENCOUNTER — Telehealth: Payer: Self-pay | Admitting: Critical Care Medicine

## 2012-06-18 NOTE — Telephone Encounter (Signed)
Noted and thanks.

## 2012-06-18 NOTE — Telephone Encounter (Signed)
This patient needs a Consult with me regarding dx of sarcoidosis.    Her husband is one of our radiology colleagues  Work her in some where please

## 2012-06-18 NOTE — Telephone Encounter (Signed)
Called, spoke with pt to schedule an consult with PW.  Pt already has an OV scheduled with PW on July 25, 2012.  I offered to move appt date up, but pt states she is ok with keeping appt in March for now.  I asked her to pls call back if her symptoms worsen or if she decides she would like to be seen sooner.  She verbalized understanding of this, was very appreciative of this offer, and voiced no further questions or concerns at this time.  Will sign off and route to PW as FYI.

## 2012-06-18 NOTE — Telephone Encounter (Signed)
Crystal, pls advise if there is any way to work pt in sooner.

## 2012-07-02 ENCOUNTER — Ambulatory Visit
Admission: RE | Admit: 2012-07-02 | Discharge: 2012-07-02 | Disposition: A | Discharge status: Home / Self Care | Payer: PRIVATE HEALTH INSURANCE | Source: Ambulatory Visit | Attending: Internal Medicine | Admitting: Internal Medicine

## 2012-07-03 NOTE — Telephone Encounter (Signed)
Patient informed. 

## 2012-07-25 ENCOUNTER — Ambulatory Visit (INDEPENDENT_AMBULATORY_CARE_PROVIDER_SITE_OTHER): Payer: PRIVATE HEALTH INSURANCE | Admitting: Critical Care Medicine

## 2012-07-25 ENCOUNTER — Encounter: Payer: Self-pay | Admitting: Critical Care Medicine

## 2012-07-25 VITALS — BP 140/98 | HR 86 | Temp 97.4°F | Ht 65.0 in

## 2012-07-25 DIAGNOSIS — D869 Sarcoidosis, unspecified: Secondary | ICD-10-CM | POA: Insufficient documentation

## 2012-07-25 NOTE — Progress Notes (Signed)
Subjective:    Patient ID: [name] [name], female    DOB: Jul 18, 1960, 52 y.o.   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  Cough Pt had LN bx ?sarcoid.  No other findings.  Routine mammogram. No mammogram findings but axillary LN .  Cyst on breast neg.  No symptoms.  Hx of cough, pt ill with bad URI, did not go away. Now on occasion will cough. Cough comes and goes for two months.  No LAN on CXR. 1-2 cig smoker in college.  Father quit smoking age 69. Ramipril caused cough   Cough This is a recurrent (2 months) problem. Episode onset: started with URI with current cough. The problem has been rapidly improving. The problem occurs every few hours. The cough is non-productive. Associated symptoms include headaches, postnasal drip and a sore throat. Pertinent negatives include no chest pain, chills, ear congestion, ear pain, fever, heartburn, hemoptysis, myalgias, nasal congestion, rhinorrhea, shortness of breath, sweats, weight loss or wheezing. Treatments tried: azithromycin rx for cough helped cough. Her past medical history is significant for bronchitis. There is no history of asthma, bronchiectasis, COPD, emphysema, environmental allergies or pneumonia.    Past Medical History  Diagnosis Date  . Hyperlipidemia   . Hypertension   . GERD (gastroesophageal reflux disease)   . Generalized headaches      Family History  Problem Relation Age of Onset  . Prostate cancer Father   . Heart disease Father   . Hypertension Father   . Hyperlipidemia Sister   . Hypertension Sister   . Alcohol abuse Brother   . Diabetes Brother   . Hypertension Brother   . Renal cancer Sister      History   Social History  . Marital Status: Married    Spouse Name: N/A    Number of Children: N/A  . Years of Education: N/A   Occupational History  . artist    Social History Main Topics  . Smoking status: Never Smoker   . Smokeless tobacco: Never Used  . Alcohol Use: Yes   Comment: socially  . Drug Use: No  . Sexually Active: Yes   Other Topics Concern  . Not on file   Social History Narrative  . No narrative on file     Allergies  Allergen Reactions  . Ramipril Cough     Outpatient Prescriptions Prior to Visit  Medication Sig Dispense Refill  . triamterene-hydrochlorothiazide (MAXZIDE-25) 37.5-25 MG per tablet TAKE 1 TABLET BY MOUTH DAILY  30 tablet  3  . venlafaxine XR (EFFEXOR-XR) 75 MG 24 hr capsule TAKE ONE CAPSULE BY MOUTH DAILY  90 capsule  PRN  . ALPRAZolam (XANAX) 0.5 MG tablet TAKE  1/2 TO 1 TABLET BY MOUTH EVERY NIGHT AT BEDTIME  60 tablet  0  . amLODipine (NORVASC) 5 MG tablet TAKE 1 TABLET BY MOUTH EVERY MORNING  90 tablet  0  . pantoprazole (PROTONIX) 40 MG tablet Take 40 mg by mouth daily.       No facility-administered medications prior to visit.      Review of Systems  Constitutional: Positive for diaphoresis, activity change and unexpected weight change. Negative for fever, chills, weight loss, appetite change and fatigue.  HENT: Positive for sore throat, sneezing, postnasal drip and sinus pressure. Negative for hearing loss, ear pain, nosebleeds, congestion, facial swelling, rhinorrhea, mouth sores, trouble swallowing, neck pain, neck stiffness, dental problem, voice change, tinnitus and ear discharge.   Eyes: Positive for visual disturbance.  Negative for photophobia, discharge and itching.  Respiratory: Positive for cough. Negative for apnea, hemoptysis, choking, chest tightness, shortness of breath, wheezing and stridor.   Cardiovascular: Negative for chest pain, palpitations and leg swelling.  Gastrointestinal: Negative for heartburn, nausea, vomiting, abdominal pain, constipation, blood in stool and abdominal distention.  Genitourinary: Negative for dysuria, urgency, frequency, hematuria, flank pain, decreased urine volume and difficulty urinating.  Musculoskeletal: Negative for myalgias, back pain, joint swelling,  arthralgias and gait problem.  Skin: Negative for color change and pallor.  Allergic/Immunologic: Negative for environmental allergies.  Neurological: Positive for speech difficulty and headaches. Negative for dizziness, tremors, seizures, syncope, weakness, light-headedness and numbness.  Hematological: Negative for adenopathy. Does not bruise/bleed easily.  Psychiatric/Behavioral: Negative for confusion, sleep disturbance and agitation. The patient is not nervous/anxious.        Objective:   Physical Exam Filed Vitals:   07/25/12 1538  BP: 140/98  Pulse: 86  Temp: 97.4 F (36.3 C)  TempSrc: Oral  Height: 5\' 5"  (1.651 m)  SpO2: 98%    Gen: Pleasant, well-nourished, in no distress,  normal affect  ENT: No lesions,  mouth clear,  oropharynx clear, no postnasal drip  Neck: No JVD, no TMG, no carotid bruits  Lungs: No use of accessory muscles, no dullness to percussion, clear without rales or rhonchi  Cardiovascular: RRR, heart sounds normal, no murmur or gallops, no peripheral edema  Abdomen: soft and NT, no HSM,  BS normal  Musculoskeletal: No deformities, no cyanosis or clubbing  Neuro: alert, non focal  Skin: Warm, no lesions or rashes    Chest x-ray is reviewed and shows no active process     Assessment & Plan:   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 No evidence for any other additional systemic lymphadenopathy. No evidence of primary pulmonary airway process. At the very worst this patient likely only has peripheral lymphadenopathy on a unilateral basis in the right axilla do to sarcoidosis. No other plausible explanation for the lymphadenopathy can be made. The patient also has mild elevations in calcium levels which may go along with the lymphadenopathy. At this point systemic steroids did not seem to be a reasonable alternative. Plan Pulmonary function testing CT Chest will be obtained Labs today: CMET,  CBC, Liver function, ACE level, vitamin D level Further treatment and followup will be pending above studies    Updated Medication List Outpatient Encounter Prescriptions as of 07/25/2012  Medication Sig Dispense Refill  . ALPRAZolam (XANAX) 0.5 MG tablet Take 0.25-0.5 mg by mouth at bedtime as needed.       . Probiotic Product (PROBIOTIC PO) Take by mouth as needed.      . triamterene-hydrochlorothiazide (MAXZIDE-25) 37.5-25 MG per tablet TAKE 1 TABLET BY MOUTH DAILY  30 tablet  3  . venlafaxine XR (EFFEXOR-XR) 75 MG 24 hr capsule TAKE ONE CAPSULE BY MOUTH DAILY  90 capsule  PRN  . [DISCONTINUED] ALPRAZolam (XANAX) 0.5 MG tablet TAKE  1/2 TO 1 TABLET BY MOUTH EVERY NIGHT AT BEDTIME  60 tablet  0  . [DISCONTINUED] amLODipine (NORVASC) 5 MG tablet TAKE 1 TABLET BY MOUTH EVERY MORNING  90 tablet  0  . [DISCONTINUED] pantoprazole (PROTONIX) 40 MG tablet Take 40 mg by mouth daily.       No facility-administered encounter medications on file as of 07/25/2012.

## 2012-07-25 NOTE — Patient Instructions (Signed)
Pulmonary function testing CT Chest will be obtained Labs today: CMET, CBC, Liver function, ACE level, vitamin D level I will call you with results and plan

## 2012-07-26 ENCOUNTER — Other Ambulatory Visit (INDEPENDENT_AMBULATORY_CARE_PROVIDER_SITE_OTHER): Payer: PRIVATE HEALTH INSURANCE

## 2012-07-26 ENCOUNTER — Telehealth: Payer: Self-pay | Admitting: Critical Care Medicine

## 2012-07-26 ENCOUNTER — Ambulatory Visit (INDEPENDENT_AMBULATORY_CARE_PROVIDER_SITE_OTHER): Payer: PRIVATE HEALTH INSURANCE | Admitting: Critical Care Medicine

## 2012-07-26 DIAGNOSIS — D869 Sarcoidosis, unspecified: Secondary | ICD-10-CM

## 2012-07-26 LAB — CBC WITH DIFFERENTIAL/PLATELET
Basophils Relative: 0.7 % (ref 0.0–3.0)
Eosinophils Relative: 0.8 % (ref 0.0–5.0)
HCT: 41.2 % (ref 36.0–46.0)
Hemoglobin: 14.3 g/dL (ref 12.0–15.0)
MCV: 92.7 fl (ref 78.0–100.0)
Monocytes Absolute: 0.5 10*3/uL (ref 0.1–1.0)
Neutro Abs: 2.7 10*3/uL (ref 1.4–7.7)
Neutrophils Relative %: 52.6 % (ref 43.0–77.0)
RBC: 4.45 Mil/uL (ref 3.87–5.11)
WBC: 5.2 10*3/uL (ref 4.5–10.5)

## 2012-07-26 LAB — COMPREHENSIVE METABOLIC PANEL
AST: 31 U/L (ref 0–37)
Albumin: 4.5 g/dL (ref 3.5–5.2)
Alkaline Phosphatase: 107 U/L (ref 39–117)
BUN: 15 mg/dL (ref 6–23)
Creatinine, Ser: 0.9 mg/dL (ref 0.4–1.2)
Glucose, Bld: 120 mg/dL — ABNORMAL HIGH (ref 70–99)
Total Bilirubin: 0.7 mg/dL (ref 0.3–1.2)

## 2012-07-26 LAB — PULMONARY FUNCTION TEST

## 2012-07-26 NOTE — Telephone Encounter (Signed)
Ok needed this infor for insurance Tobe Sos

## 2012-07-26 NOTE — Assessment & Plan Note (Signed)
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 No evidence for any other additional systemic lymphadenopathy. No evidence of primary pulmonary airway process. At the very worst this patient likely only has peripheral lymphadenopathy on a unilateral basis in the right axilla do to sarcoidosis. No other plausible explanation for the lymphadenopathy can be made. The patient also has mild elevations in calcium levels which may go along with the lymphadenopathy. At this point systemic steroids did not seem to be a reasonable alternative. Plan Pulmonary function testing CT Chest will be obtained Labs today: CMET, CBC, Liver function, ACE level, vitamin D level Further treatment and followup will be pending above studies

## 2012-07-26 NOTE — Progress Notes (Signed)
PFT done today. 

## 2012-07-30 ENCOUNTER — Ambulatory Visit
Admission: RE | Admit: 2012-07-30 | Discharge: 2012-07-30 | Disposition: A | Discharge status: Home / Self Care | Payer: PRIVATE HEALTH INSURANCE | Source: Ambulatory Visit | Attending: Critical Care Medicine | Admitting: Critical Care Medicine

## 2012-07-30 DIAGNOSIS — D869 Sarcoidosis, unspecified: Secondary | ICD-10-CM

## 2012-07-30 LAB — VITAMIN D 1,25 DIHYDROXY: Vitamin D3 1, 25 (OH)2: 39 pg/mL

## 2012-07-30 MED ORDER — IOHEXOL 300 MG/ML  SOLN
75.0000 mL | Freq: Once | INTRAMUSCULAR | Status: AC | PRN
  Administered 2012-07-30: 75 mL via INTRAVENOUS

## 2012-07-30 NOTE — Progress Notes (Signed)
Quick Note:  lmomtcb for pt ______ 

## 2012-07-30 NOTE — Progress Notes (Signed)
Quick Note:  Call pt and tell her Vitamin D levels are normal. I will call with CT Chest results when available ______

## 2012-07-31 NOTE — Progress Notes (Signed)
Quick Note:  Called, spoke with pt. Informed her Vit D levels are normal per PW. Advised will call back with CT Chest results once available. She verbalized understanding and voiced no further questions or concerns at this time. ______

## 2012-08-01 ENCOUNTER — Other Ambulatory Visit: Payer: PRIVATE HEALTH INSURANCE

## 2012-08-01 ENCOUNTER — Encounter: Payer: Self-pay | Admitting: Critical Care Medicine

## 2012-08-01 NOTE — Progress Notes (Signed)
Quick Note:  Notify the patient that the CT Scan is normal. No sarcoid in lung. No need for ANY treatment. F/u is prn i have tried for three days to call and cannot get cell to answer ______

## 2012-08-02 NOTE — Progress Notes (Signed)
Quick Note:  Called, spoke with pt. Informed her of CT Chest results and recs per Dr. Delford Field. She verbalized understanding of this and voiced no further questions or concerns at this time. ______

## 2012-08-03 ENCOUNTER — Telehealth: Payer: Self-pay | Admitting: Internal Medicine

## 2012-08-03 NOTE — Telephone Encounter (Signed)
Verbal order from Dr. Lenord Fellers to call in Ambien 10 mg #6 1 po qhs, no refill.  Called Rx to [facility] @ [phone]; spoke with Romeo Apple.  Called Jiyah to advise.

## 2012-08-13 ENCOUNTER — Encounter: Payer: Self-pay | Admitting: Critical Care Medicine

## 2012-08-25 ENCOUNTER — Other Ambulatory Visit: Payer: Self-pay | Admitting: Internal Medicine

## 2012-11-14 ENCOUNTER — Other Ambulatory Visit: Payer: Self-pay | Admitting: Internal Medicine

## 2012-11-14 ENCOUNTER — Other Ambulatory Visit: Payer: Self-pay

## 2012-11-30 ENCOUNTER — Encounter: Payer: Self-pay | Admitting: Internal Medicine

## 2012-11-30 ENCOUNTER — Ambulatory Visit (INDEPENDENT_AMBULATORY_CARE_PROVIDER_SITE_OTHER): Payer: Self-pay | Admitting: Internal Medicine

## 2012-11-30 VITALS — BP 136/98 | HR 72 | Temp 98.2°F

## 2012-11-30 DIAGNOSIS — R232 Flushing: Secondary | ICD-10-CM

## 2012-11-30 DIAGNOSIS — I10 Essential (primary) hypertension: Secondary | ICD-10-CM

## 2012-11-30 DIAGNOSIS — N951 Menopausal and female climacteric states: Secondary | ICD-10-CM

## 2012-11-30 MED ORDER — AMLODIPINE BESYLATE 5 MG PO TABS: ORAL_TABLET | ORAL | Status: DC

## 2012-11-30 MED ORDER — VENLAFAXINE HCL ER 150 MG PO CP24: ORAL_CAPSULE | ORAL | Status: DC

## 2012-12-01 NOTE — Patient Instructions (Signed)
Increase Effexor to 150 mg daily. Add amlodipine 5 mg daily to Maxzide 25. Schedule physical exam in the near future with fasting lab work including [facility]

## 2012-12-01 NOTE — Progress Notes (Signed)
  Subjective:    Patient ID: [name] [name], female    DOB: February 22, 1961, 52 y.o.   MRN: [id number]  HPI Patient refuses to weigh today. She has a history of hypertension. At one point was on amlodipine but quit taking it. She then placed herself on Benicar because her sister was on and then she thought it worked well for her sister. However sister died of cancer and patient subsequently read an article same Benicar was harmful so she quit taking that. She is just taking Maxzide 25 now at the present time for hypertension. Says it has "stopped working". She and her husband are building a new house. She's having some hot flashes. Thinks she is menopausal. Has not had physical exam in some time. Needs to make appointment for physical exam and fasting lab work. History of hyperlipidemia and has refused to take lipid-lowering medication saying it caused side effects.    Review of Systems     Objective:   Physical Exam spent with patient speaking about blood pressure control and hot flashes. She is on Effexor 75 mg daily for mood disorder. She has heard that Effexor may help hot flashes. Wants to try increased dose of Effexor.        Assessment & Plan:  Hypertension  History of mood disorder  Hot flashes  Plan: Restart amlodipine 5 mg daily and continue with Maxzide 25 daily. Schedule physical exam in the near future at which time blood pressure will be rechecked as well as fasting lab work including [facility]. Increase Effexor to 150 mg daily.

## 2012-12-21 ENCOUNTER — Other Ambulatory Visit: Payer: Self-pay | Admitting: Internal Medicine

## 2013-01-14 ENCOUNTER — Other Ambulatory Visit: Payer: Self-pay | Admitting: Internal Medicine

## 2013-02-01 ENCOUNTER — Other Ambulatory Visit: Payer: Self-pay | Admitting: Internal Medicine

## 2013-02-04 ENCOUNTER — Encounter: Payer: PRIVATE HEALTH INSURANCE | Admitting: Internal Medicine

## 2013-02-06 ENCOUNTER — Other Ambulatory Visit: Payer: Self-pay | Admitting: Internal Medicine

## 2013-02-25 ENCOUNTER — Other Ambulatory Visit: Payer: Self-pay | Admitting: Internal Medicine

## 2013-02-26 ENCOUNTER — Other Ambulatory Visit (HOSPITAL_COMMUNITY)
Admission: RE | Admit: 2013-02-26 | Discharge: 2013-02-26 | Disposition: A | Discharge status: Home / Self Care | Payer: PRIVATE HEALTH INSURANCE | Source: Ambulatory Visit | Attending: Internal Medicine | Admitting: Internal Medicine

## 2013-02-26 ENCOUNTER — Encounter: Payer: Self-pay | Admitting: Internal Medicine

## 2013-02-26 ENCOUNTER — Ambulatory Visit (INDEPENDENT_AMBULATORY_CARE_PROVIDER_SITE_OTHER): Payer: PRIVATE HEALTH INSURANCE | Admitting: Internal Medicine

## 2013-02-26 VITALS — BP 130/80 | HR 100 | Temp 98.1°F | Ht 65.0 in

## 2013-02-26 DIAGNOSIS — Z862 Personal history of diseases of the blood and blood-forming organs and certain disorders involving the immune mechanism: Secondary | ICD-10-CM

## 2013-02-26 DIAGNOSIS — Z0000 Encounter for general adult medical examination without abnormal findings: Secondary | ICD-10-CM

## 2013-02-26 DIAGNOSIS — Z01419 Encounter for gynecological examination (general) (routine) without abnormal findings: Secondary | ICD-10-CM | POA: Insufficient documentation

## 2013-02-26 DIAGNOSIS — Z1272 Encounter for screening for malignant neoplasm of vagina: Secondary | ICD-10-CM

## 2013-02-26 DIAGNOSIS — E785 Hyperlipidemia, unspecified: Secondary | ICD-10-CM

## 2013-02-26 DIAGNOSIS — I10 Essential (primary) hypertension: Secondary | ICD-10-CM

## 2013-02-26 LAB — POCT URINALYSIS DIPSTICK
Ketones, UA: NEGATIVE
Leukocytes, UA: NEGATIVE
Protein, UA: NEGATIVE
Spec Grav, UA: 1.015
pH, UA: 7

## 2013-02-28 ENCOUNTER — Other Ambulatory Visit: Payer: PRIVATE HEALTH INSURANCE | Admitting: Internal Medicine

## 2013-02-28 DIAGNOSIS — Z1329 Encounter for screening for other suspected endocrine disorder: Secondary | ICD-10-CM

## 2013-02-28 DIAGNOSIS — Z130 Encounter for screening for diseases of the blood and blood-forming organs and certain disorders involving the immune mechanism: Secondary | ICD-10-CM

## 2013-02-28 DIAGNOSIS — E2839 Other primary ovarian failure: Secondary | ICD-10-CM

## 2013-02-28 DIAGNOSIS — Z13220 Encounter for screening for lipoid disorders: Secondary | ICD-10-CM

## 2013-02-28 DIAGNOSIS — Z0000 Encounter for general adult medical examination without abnormal findings: Secondary | ICD-10-CM

## 2013-02-28 LAB — COMPREHENSIVE METABOLIC PANEL
ALT: 19 U/L (ref 0–35)
Alkaline Phosphatase: 95 U/L (ref 39–117)
Sodium: 139 mEq/L (ref 135–145)
Total Bilirubin: 0.4 mg/dL (ref 0.3–1.2)
Total Protein: 6.6 g/dL (ref 6.0–8.3)

## 2013-02-28 LAB — CBC WITH DIFFERENTIAL/PLATELET
Eosinophils Absolute: 0.1 10*3/uL (ref 0.0–0.7)
Eosinophils Relative: 2 % (ref 0–5)
Hemoglobin: 14 g/dL (ref 12.0–15.0)
Lymphs Abs: 1.3 10*3/uL (ref 0.7–4.0)
MCH: 32.4 pg (ref 26.0–34.0)
MCV: 91 fL (ref 78.0–100.0)
Monocytes Relative: 9 % (ref 3–12)
RBC: 4.32 MIL/uL (ref 3.87–5.11)

## 2013-02-28 LAB — LIPID PANEL
LDL Cholesterol: 183 mg/dL — ABNORMAL HIGH (ref 0–99)
Total CHOL/HDL Ratio: 3.9 Ratio
VLDL: 30 mg/dL (ref 0–40)

## 2013-02-28 LAB — TSH: TSH: 2.267 u[IU]/mL (ref 0.350–4.500)

## 2013-03-01 ENCOUNTER — Other Ambulatory Visit: Payer: Self-pay | Admitting: Internal Medicine

## 2013-06-03 ENCOUNTER — Ambulatory Visit (INDEPENDENT_AMBULATORY_CARE_PROVIDER_SITE_OTHER): Payer: PRIVATE HEALTH INSURANCE | Admitting: Sports Medicine

## 2013-06-03 ENCOUNTER — Encounter: Payer: Self-pay | Admitting: Sports Medicine

## 2013-06-03 VITALS — BP 130/96 | HR 84 | Ht 65.0 in | Wt 155.0 lb

## 2013-06-03 DIAGNOSIS — M79661 Pain in right lower leg: Secondary | ICD-10-CM

## 2013-06-03 DIAGNOSIS — M79609 Pain in unspecified limb: Secondary | ICD-10-CM

## 2013-06-03 NOTE — Progress Notes (Signed)
   Subjective:    Patient ID: [name] [name], female    DOB: 10/18/1960, 53 y.o.   MRN: [id number]  HPI chief complaint: Right lower leg pain  53 year old female comes in today complaining of one week of right lower leg pain. No specific injury that she can recall but rather sudden onset of pain that began during tennis. She is describing pain along the medial calf with radiating pain just posterior to the medial malleolus. No associated swelling or ecchymosis. No numbness or tingling. No similar episodes in the past. She has tried some Sorbothane heel cups and light compression but despite that her symptoms persist. No pain in the Achilles tendon.  Past medical history and current medications are reviewed    Review of Systems     Objective:   Physical Exam Well-developed, well-nourished. No acute distress. Vital signs are reviewed. Awake alert and oriented x3  Right lower leg: There is tenderness to palpation at the medial distal third of the calf. No palpable defect. No soft tissue swelling. No ecchymosis. No tenderness to palpation along the Achilles tendon. Good strength with plantar flexion and resisted great toe flexion. No pain with standing on her tiptoes. Neurovascularly intact distally. Walking without a significant limp.  MSK ultrasound of the right lower leg was performed. There is a hypoechoic area at the insertion of the gastrocnemius onto the soleus directly over the area of maximum tenderness. Some mild hypoechoic changes in the soleus as well but no discrete tear. Visualized portion of the plantaris appears to be within normal limits. Achilles tendon within normal limits.       Assessment & Plan:  Right lower leg pain secondary to calf strain  MSK ultrasound suggest that this is a very early and mild nontraumatic form of "tennis leg". Although the plantaris tendon appears to be within normal limits I cannot completely exclude a strain to this structure as well.  Nonetheless, treatment will be the same. Patient is given 5/16 inch heel lifts and a body helix compression sleeve. She will start doing eccentric heel drops exercises and I think she should wait another week or so before returning to tennis. I look her symptoms to improve over the next 2-3 weeks. Followup for ongoing or recalcitrant issues.

## 2013-06-07 ENCOUNTER — Ambulatory Visit: Payer: PRIVATE HEALTH INSURANCE

## 2013-06-29 NOTE — Progress Notes (Signed)
   Subjective:    Patient ID: [name] [name], female    DOB: 10/21/1960, 53 y.o.   MRN: [id number]  HPI 53 year old white female with history of hypertension and hyperlipidemia as well as mood disorder and history of migraine headaches in today for health maintenance examination. Has also been diagnosed with sarcoidosis by pulmonary physician. Has never had colonoscopy. Has not had recent GYN exam. Declines influenza immunization.  In March 2014 she had a respiratory infection. Chest x-ray raise raised possibility of sarcoidosis. She had biopsy of right axillary lymph node showing noncaseating granulomas. No evidence of primary pulmonary airway process. Pulmonologist did not think systemic steroids were reasonable at that point in time. Pulmonary functions were scheduled as well as CT of chest. ACE level was drawn.  Unable to tolerate statin medication. Currently on amlodipine and Maxide 25 for hypertension. Takes Protonix for GE reflux and Effexor for mood disorder.  Takes Xanax for insomnia and anxiety.  Intolerant of some morphine-type drugs which cause itching.  In 2005 patient had elbow injury. C-section 1992 and 1993. Patient had bilateral tubal ligation and abdominoplasty 1994. Benign breast biopsy 1993.  Hospitalized with Campylobacter diarrhea in 2001.  Family history: Father died due to metastatic prostate cancer with history of heart disease and hypertension. Sister with history of hypertension and hyperlipidemia who passed away from renal cancer. Brother with history of alcohol abuse, diabetes and hypertension. 2 brothers in good health.  Social history: Married. Husband is a Marine scientistradiologist. Patient plays a lot of tennis. Does not smoke. Formerly smoked for 6 years off and on in high school and college. Social alcohol consumption. 2 adult daughters. Homemaker. College degree.    Review of Systems  Constitutional: Negative.   All other systems reviewed and are negative.      Objective:   Physical Exam  Vitals reviewed. Constitutional: She is oriented to person, place, and time. She appears well-developed and well-nourished. No distress.  HENT:  Head: Normocephalic and atraumatic.  Right Ear: External ear normal.  Left Ear: External ear normal.  Mouth/Throat: Oropharynx is clear and moist. No oropharyngeal exudate.  Eyes: Conjunctivae and EOM are normal. Pupils are equal, round, and reactive to light. Right eye exhibits no discharge. Left eye exhibits no discharge. No scleral icterus.  Neck: Neck supple. No JVD present. No thyromegaly present.  Cardiovascular: Normal rate, regular rhythm, normal heart sounds and intact distal pulses.   No murmur heard. Pulmonary/Chest: Effort normal and breath sounds normal. No respiratory distress. She has no wheezes. She has no rales. She exhibits no tenderness.  Breasts normal female  Abdominal: Soft. Bowel sounds are normal. She exhibits no distension and no mass. There is no tenderness. There is no rebound and no guarding.  No hepatosplenomegaly  Genitourinary:  Pap taken bimanual normal  Musculoskeletal: She exhibits no edema.  Neurological: She is alert and oriented to person, place, and time. She has normal reflexes. She displays normal reflexes. No cranial nerve deficit. Coordination normal.  Skin: Skin is warm and dry. No rash noted. She is not diaphoretic.  Psychiatric: She has a normal mood and affect. Her behavior is normal. Judgment and thought content normal.          Assessment & Plan:  Essential hypertension  Hyperlipidemia-intolerant of statin medication  Sarcoidosis-asymptomatic. Detected on lymph node biopsy  Mood disorder  Plan: Continue same medications return in 6 months. Discussed colonoscopy. Patient will consider it.

## 2013-06-29 NOTE — Patient Instructions (Signed)
Continue same medications and return in 6 months 

## 2013-07-24 ENCOUNTER — Other Ambulatory Visit: Payer: Self-pay

## 2013-07-24 ENCOUNTER — Other Ambulatory Visit: Payer: Self-pay | Admitting: Internal Medicine

## 2013-07-24 MED ORDER — AMLODIPINE BESYLATE 5 MG PO TABS: 5.0000 mg | ORAL_TABLET | Freq: Every day | ORAL | Status: DC

## 2013-08-07 ENCOUNTER — Other Ambulatory Visit: Payer: Self-pay

## 2013-08-07 DIAGNOSIS — Z1231 Encounter for screening mammogram for malignant neoplasm of breast: Secondary | ICD-10-CM

## 2013-08-07 DIAGNOSIS — Z9889 Other specified postprocedural states: Secondary | ICD-10-CM

## 2013-08-15 ENCOUNTER — Other Ambulatory Visit: Payer: Self-pay | Admitting: Internal Medicine

## 2013-08-15 ENCOUNTER — Encounter: Payer: Self-pay | Admitting: Internal Medicine

## 2013-08-15 ENCOUNTER — Ambulatory Visit (INDEPENDENT_AMBULATORY_CARE_PROVIDER_SITE_OTHER): Payer: PRIVATE HEALTH INSURANCE | Admitting: Internal Medicine

## 2013-08-15 VITALS — BP 128/98 | HR 84 | Temp 99.0°F

## 2013-08-15 DIAGNOSIS — I10 Essential (primary) hypertension: Secondary | ICD-10-CM

## 2013-08-15 DIAGNOSIS — Z789 Other specified health status: Secondary | ICD-10-CM

## 2013-08-15 DIAGNOSIS — E785 Hyperlipidemia, unspecified: Secondary | ICD-10-CM

## 2013-08-15 DIAGNOSIS — F39 Unspecified mood [affective] disorder: Secondary | ICD-10-CM

## 2013-08-15 MED ORDER — VENLAFAXINE HCL ER 75 MG PO CP24: 75.0000 mg | ORAL_CAPSULE | Freq: Every day | ORAL | Status: DC

## 2013-08-15 MED ORDER — LOSARTAN POTASSIUM 50 MG PO TABS: 50.0000 mg | ORAL_TABLET | Freq: Every day | ORAL | Status: DC

## 2013-08-15 MED ORDER — HYDROCHLOROTHIAZIDE 25 MG PO TABS: 25.0000 mg | ORAL_TABLET | Freq: Every day | ORAL | Status: DC

## 2013-08-15 NOTE — Patient Instructions (Signed)
Take HCTZ 25 mg daily. Take Losartan 50 mg daily. Continue amlodine. Taper Effexor to 75 mg daily x one month then every other day x one month then D/C

## 2013-08-15 NOTE — Progress Notes (Signed)
   Subjective:    Patient ID: Rito EhrlichJulie Paradise Carnell, female    DOB: 04/16/1961, 53 y.o.   MRN: [id number]  HPI Patient here today to followup on hypertension. Blood pressures have been elevated at home. Her husband has been concerned about this. She has a history of hyperlipidemia but cannot tolerate statins. She and her husband plan a biking trip to [place] in May. He is currently on amlodipine. She plays a lot of tennis. Does not want to be on a beta blocker.    Review of Systems     Objective:   Physical Exam  Neck is supple without JVD thyromegaly or carotid bruits. Chest clear to auscultation. Cardiac exam regular rate and rhythm. Extremities without edema      Assessment & Plan:  Essential hypertension  Plan: Patient was to come off of Effexor. Have talked with her about tapering it over the next 2 months. She currently is on Maxzide in addition to amlodipine. She will stop Maxide. Will take HCTZ 25 mg daily, continue amlodipine and add losartan 50 mg daily. Return in 8 weeks. At that time will need basic metabolic panel.

## 2013-08-19 ENCOUNTER — Ambulatory Visit
Admission: RE | Admit: 2013-08-19 | Discharge: 2013-08-19 | Disposition: A | Discharge status: Home / Self Care | Payer: Self-pay | Source: Ambulatory Visit

## 2013-08-19 DIAGNOSIS — Z1231 Encounter for screening mammogram for malignant neoplasm of breast: Secondary | ICD-10-CM

## 2013-08-19 DIAGNOSIS — Z9889 Other specified postprocedural states: Secondary | ICD-10-CM

## 2013-08-29 ENCOUNTER — Ambulatory Visit: Payer: PRIVATE HEALTH INSURANCE | Admitting: Internal Medicine

## 2013-09-06 ENCOUNTER — Telehealth: Payer: Self-pay | Admitting: Internal Medicine

## 2013-09-06 NOTE — Telephone Encounter (Signed)
Verbal order by Dr. Lenord FellersBaxley to increase Losartan to 100mg  daily.  HCTZ remain the same.  Patient instructed to increase today and keep track of pressures and call us early next week to advise on her BP readings.  Patient verbalizes understanding of these instructions.

## 2013-09-10 ENCOUNTER — Other Ambulatory Visit: Payer: Self-pay

## 2013-09-10 ENCOUNTER — Other Ambulatory Visit: Payer: Self-pay | Admitting: Internal Medicine

## 2013-09-10 MED ORDER — LOSARTAN POTASSIUM 100 MG PO TABS: 100.0000 mg | ORAL_TABLET | Freq: Every day | ORAL | Status: DC

## 2013-09-10 NOTE — Telephone Encounter (Signed)
Patient will come in for a BMet on 09/19/2013, per verbal order of Dr. Lenord FellersBaxley. [facility] Rx for Losartan 100mg  [facility]

## 2013-09-12 ENCOUNTER — Other Ambulatory Visit: Payer: Self-pay | Admitting: Gastroenterology

## 2013-09-18 ENCOUNTER — Ambulatory Visit (INDEPENDENT_AMBULATORY_CARE_PROVIDER_SITE_OTHER): Payer: PRIVATE HEALTH INSURANCE | Admitting: Neurology

## 2013-09-18 ENCOUNTER — Encounter (INDEPENDENT_AMBULATORY_CARE_PROVIDER_SITE_OTHER): Payer: Self-pay

## 2013-09-18 ENCOUNTER — Encounter: Payer: Self-pay | Admitting: Neurology

## 2013-09-18 VITALS — BP 126/87 | HR 106 | Ht 65.0 in

## 2013-09-18 DIAGNOSIS — G43109 Migraine with aura, not intractable, without status migrainosus: Secondary | ICD-10-CM

## 2013-09-18 DIAGNOSIS — H532 Diplopia: Secondary | ICD-10-CM

## 2013-09-18 HISTORY — DX: Diplopia: H53.2

## 2013-09-18 HISTORY — DX: Migraine with aura, not intractable, without status migrainosus: G43.109

## 2013-09-18 NOTE — Progress Notes (Signed)
Reason for visit: Double vision  [name] [name] is a 53 y.o. female  History of present illness:  [name] [name] is a 53 year old right-handed white female with a history of 2 separate episodes of double vision. [name] [name] indicates that [name] first event occurred about 2 months prior to this evaluation. She was walking her dog at [name] time, and she had a sensation of pulling in [name] left eye. [name] [name] then developed significant double vision that lasted 5-10 minutes, and then it resolved. There were no other associated symptoms with this event such as loss of vision, slurred speech, numbness or weakness of [name] extremities, headache, or confusion. [name] [name] had no loss of consciousness. There have been no changes in balance. [name] [name] had a second almost identical event that occurred 3 weeks prior to this evaluation. [name] [name] had full resolution of [name] double vision without associated symptoms. She has undergone a full ophthalmologic evaluation through Dr. Dagoberto LigasStoneburner, and she is referred to this office for an evaluation. [name] [name] does have a history of sarcoidosis affecting [name] lymph nodes. There is a family history of diabetes, but [name] [name] herself does not have diabetes.  Past Medical History  Diagnosis Date  . Hyperlipidemia   . Hypertension   . GERD (gastroesophageal reflux disease)   . Generalized headaches   . Diplopia 09/18/2013  . Classic migraine 09/18/2013    Past Surgical History  Procedure Laterality Date  . Abdominal hysterectomy    . Cesarean section      x 2  . Tonsillectomy    . Tendon surgery on elbow      Family History  Problem Relation Age of Onset  . Prostate cancer Father   . Heart disease Father   . Hypertension Father   . Hyperlipidemia Sister   . Hypertension Sister   . Renal cancer Sister   . Alcohol abuse Brother   . Diabetes Brother   . Hypertension Brother     Social history:  reports that she has never smoked. She has never used  smokeless tobacco. She reports that she drinks about 8.4 ounces of alcohol per week. She reports that she does not use illicit drugs.  Medications:  Current Outpatient Prescriptions on File Prior to Visit  Medication Sig Dispense Refill  . hydrochlorothiazide (HYDRODIURIL) 25 MG tablet TAKE 1 TABLET BY MOUTH DAILY  90 tablet  1  . losartan (COZAAR) 100 MG tablet TAKE 1 TABLET BY MOUTH DAILY  90 tablet  1  . Probiotic Product (PROBIOTIC PO) Take by mouth as needed.      . venlafaxine XR (EFFEXOR XR) 75 MG 24 hr capsule Take 1 capsule (75 mg total) by mouth daily with breakfast.  30 capsule  1   No current facility-administered medications on file prior to visit.      Allergies  Allergen Reactions  . Ramipril Cough    ROS:  Out of a complete 14 system review of symptoms, [name] [name] complains only of [name] following symptoms, and all other reviewed systems are negative.  Double vision Migraine headache  Blood pressure 126/87, pulse 106, height 5\' 5"  (1.651 m), weight 0 lb (0 kg).  Physical Exam  General: [name] [name] is alert and cooperative at [name] time of [name] examination.  Eyes: Pupils are equal, round, and reactive to light. Discs are flat bilaterally.  Neck: [name] neck is supple, no carotid bruits are noted.  Respiratory: [name] respiratory examination is clear.  Cardiovascular: [name] cardiovascular  examination reveals a regular rate and rhythm, no obvious murmurs or rubs are noted.  Skin: Extremities are without significant edema.  Neurologic Exam  Mental status: [name] [name] is alert and oriented x 3 at [name] time of [name] examination. [name] [name] has apparent normal recent and remote memory, with an apparently normal attention span and concentration ability.  Cranial nerves: Facial symmetry is not present. [name] intra-palpebral fissure on [name] right appears to be larger than [name] left. There is good sensation of [name] face to pinprick and soft touch bilaterally. [name] strength of [name]  facial muscles and [name] muscles to head turning and shoulder shrug are normal bilaterally. Speech is well enunciated, no aphasia or dysarthria is noted. Extraocular movements are full. Visual fields are full. [name] tongue is midline, and [name] [name] has symmetric elevation of [name] soft palate. No obvious hearing deficits are noted. Cover test is negative.  Motor: [name] motor testing reveals 5 over 5 strength of all 4 extremities. Good symmetric motor tone is noted throughout.  Sensory: Sensory testing is intact to pinprick, soft touch, vibration sensation, and position sense on all 4 extremities. No evidence of extinction is noted.  Coordination: Cerebellar testing reveals good finger-nose-finger and heel-to-shin bilaterally.  Gait and station: Gait is normal. Tandem gait is normal. Romberg is negative. No drift is seen.  Reflexes: Deep tendon reflexes are symmetric and normal bilaterally. Toes are downgoing bilaterally.   Assessment/Plan:  1. Episodic double vision  2. History of sarcoidosis  [name] [name] will be sent for further evaluation to include MRI [name] brain with and without gadolinium, and she will have blood work done today. By examination, there is a widening of [name] intra-palpebral fissure for [name] right eye relative to [name] left, [name] [name] could potentially have mild proptosis on [name] right. If [name] above workup is unremarkable, we will watch [name] [name] on a conservative basis.  Marlan Palau. Keith Willis MD 09/18/2013 7:52 PM  [facility] [address] [place] [id number]  Phone [phone] Fax [phone]

## 2013-09-18 NOTE — Patient Instructions (Signed)
Diplopia  Double vision (diplopia) means that you are seeing two of everything. Diplopia usually occurs with both eyes open, and may be worse when looking in one particular direction. If both eyes must be open to see double, this is called binocular diplopia. If double images are seen in just one eye, this is called monocular diplopia.  CAUSES  Binocular Diplopia  Disorder affecting the muscles that move the eyes or the nerves that control those muscles.  Tumor or other mass pushing on an eye from beside or behind the eye.  Myasthenia gravis. This is a neuromuscular illness that causes the body's muscles to tire easily. The eye muscles and eyelid muscles become weak. The eyes do not track together well.  Grave's disease. This is an overactivity of the thyroid gland. This condition causes swelling of tissues around the eyes. This produces a bulging out of the eyeball.  Blowout fracture of the bone around the eye. The muscles of the eye socket are damaged. This often happens when the eye is hit with force.  Complications after certain surgeries, such as a lens implant after cataract surgery.  Fluid-filled mass (abscess) behind or beside the eye, infection, or abnormal connection between arteries and veins. Sometimes, no cause is found. Monocular Diplopia  Problems with corrective lens or contacts.  Corneal abrasion, infection, severe injury, or bulging and irregularity of the corneal surface (keratoconus).  Irregularities of the pupil from drugs, severe injury, or other causes.  Problems involving the lens of the eye, such as opacities or cataracts.  Complications after certain surgeries, such as a lens implant after cataract surgery.  Retinal detachment or problems involving the blood vessels of the retina. Sometimes, no cause is found. SYMPTOMS  Binocular Diplopia  When one eye is closed or covered, the double images disappear. Monocular Diplopia  When the unaffected eye is  closed or covered, the double images remain. The double images disappear when the affected eye is closed or covered. DIAGNOSIS  A diagnosis is made during an eye exam. TREATMENT  Treatment depends on the cause or underlying disease.   Relief of double vision symptoms may be achieved by patching one eye or by using special glasses.  Surgery on the muscles of the eye may be needed. SEEK IMMEDIATE MEDICAL CARE IF:   You see two images of a single object you are looking at, either with both eyes open or with just one eye open. Document Released: 02/18/2004 Document Revised: 07/11/2011 Document Reviewed: 12/05/2007 [employer] Patient Information 2014 [employer].  

## 2013-09-19 ENCOUNTER — Telehealth: Payer: Self-pay | Admitting: Neurology

## 2013-09-19 LAB — TSH: TSH: 3.43 u[IU]/mL (ref 0.450–4.500)

## 2013-09-19 LAB — HEMOGLOBIN A1C
ESTIMATED AVERAGE GLUCOSE: 117 mg/dL
Hgb A1c MFr Bld: 5.7 % — ABNORMAL HIGH (ref 4.8–5.6)

## 2013-09-19 LAB — SEDIMENTATION RATE: Sed Rate: 6 mm/hr (ref 0–40)

## 2013-09-19 LAB — THYROGLOBULIN ANTIBODY

## 2013-09-19 LAB — THYROID PEROXIDASE ANTIBODY: Thyroid Peroxidase Ab: <6 IU/mL (ref 0–34)

## 2013-09-19 LAB — ACETYLCHOLINE RECEPTOR, BINDING

## 2013-09-19 LAB — ANA W/REFLEX: Anti Nuclear Antibody(ANA): NEGATIVE

## 2013-09-19 LAB — VITAMIN B12: Vitamin B-12: 386 pg/mL (ref 211–946)

## 2013-09-19 NOTE — Telephone Encounter (Signed)
I called the patient. The blood work was unremarkable, with the exception that the hemoglobin A1c was minimally elevated at 5.7. I will call her when I get the results of the MRI brain evaluation.

## 2013-10-15 ENCOUNTER — Other Ambulatory Visit: Payer: PRIVATE HEALTH INSURANCE | Admitting: Internal Medicine

## 2013-10-15 DIAGNOSIS — I10 Essential (primary) hypertension: Secondary | ICD-10-CM

## 2013-10-15 DIAGNOSIS — E785 Hyperlipidemia, unspecified: Secondary | ICD-10-CM

## 2013-10-15 LAB — LIPID PANEL
Cholesterol: 301 mg/dL — ABNORMAL HIGH (ref 0–200)
HDL: 63 mg/dL (ref >39)
LDL Cholesterol: 199 mg/dL — ABNORMAL HIGH (ref 0–99)
TRIGLYCERIDES: 196 mg/dL — AB (ref <150)
Total CHOL/HDL Ratio: 4.8 Ratio
VLDL: 39 mg/dL (ref 0–40)

## 2013-10-15 LAB — BASIC METABOLIC PANEL
BUN: 15 mg/dL (ref 6–23)
CHLORIDE: 103 meq/L (ref 96–112)
CO2: 27 meq/L (ref 19–32)
CREATININE: 0.79 mg/dL (ref 0.50–1.10)
Calcium: 9.3 mg/dL (ref 8.4–10.5)
Glucose, Bld: 92 mg/dL (ref 70–99)
POTASSIUM: 4.2 meq/L (ref 3.5–5.3)
Sodium: 138 mEq/L (ref 135–145)

## 2013-10-17 ENCOUNTER — Ambulatory Visit (INDEPENDENT_AMBULATORY_CARE_PROVIDER_SITE_OTHER): Payer: PRIVATE HEALTH INSURANCE | Admitting: Internal Medicine

## 2013-10-17 ENCOUNTER — Encounter: Payer: Self-pay | Admitting: Internal Medicine

## 2013-10-17 VITALS — BP 150/94 | HR 76 | Wt 158.0 lb

## 2013-10-17 DIAGNOSIS — I10 Essential (primary) hypertension: Secondary | ICD-10-CM

## 2013-10-17 DIAGNOSIS — E785 Hyperlipidemia, unspecified: Secondary | ICD-10-CM

## 2013-10-17 MED ORDER — EZETIMIBE 10 MG PO TABS: 10.0000 mg | ORAL_TABLET | Freq: Every day | ORAL | Status: DC

## 2013-11-04 ENCOUNTER — Telehealth: Payer: Self-pay | Admitting: Internal Medicine

## 2013-11-04 NOTE — Telephone Encounter (Signed)
Please make this referral

## 2013-11-07 NOTE — Telephone Encounter (Signed)
Left message for patient to call.

## 2013-12-01 ENCOUNTER — Other Ambulatory Visit: Payer: Self-pay | Admitting: Internal Medicine

## 2013-12-02 ENCOUNTER — Other Ambulatory Visit: Payer: PRIVATE HEALTH INSURANCE | Admitting: Internal Medicine

## 2013-12-03 ENCOUNTER — Ambulatory Visit: Payer: PRIVATE HEALTH INSURANCE | Admitting: Internal Medicine

## 2013-12-19 ENCOUNTER — Ambulatory Visit (INDEPENDENT_AMBULATORY_CARE_PROVIDER_SITE_OTHER): Payer: PRIVATE HEALTH INSURANCE | Admitting: Internal Medicine

## 2013-12-19 ENCOUNTER — Encounter: Payer: Self-pay | Admitting: Internal Medicine

## 2013-12-19 VITALS — BP 118/80 | HR 82 | Temp 98.4°F

## 2013-12-19 DIAGNOSIS — I10 Essential (primary) hypertension: Secondary | ICD-10-CM

## 2013-12-19 DIAGNOSIS — E785 Hyperlipidemia, unspecified: Secondary | ICD-10-CM

## 2013-12-19 NOTE — Progress Notes (Signed)
   Subjective:    Patient ID: [name] EhrlichJulie Paradise [name], female    DOB: 11/24/1960, 53 y.o.   MRN: [id number]  HPI For recheck on hypertension and hyperlipidemia. Blood pressure is excellent on losartan. Husband who is a physician has convinced her to try Lipitor once a week. Dose maybe 10 mg. Patient is not really sure.    Review of Systems     Objective:   Physical Exam   Not examined. Spent 15 minutes between with patient about hypertension and hyperlipidemia. Hyperlipidemia is familial.  Hyperlipidemia    Plan: Patient will continue with Lipitor 10 mg weekly. If she continues to take this she will return in 3 months for lipid panel and liver functions. Otherwise I will recheck blood pressure in 6 months     Assessment & Plan:  Hypertension  Hyperlipidemia  Plan: Patient will continue with Lipitor 10 mg weekly. If she continues to take this she'll return in 3 minutes for lipid panel and liver functions. Otherwise I will recheck blood pressure in 6 months.

## 2013-12-19 NOTE — Patient Instructions (Addendum)
Return in 3 months for lipid liver panels if  taking Lipitor.

## 2014-01-06 ENCOUNTER — Encounter: Payer: Self-pay | Admitting: Internal Medicine

## 2014-01-06 NOTE — Progress Notes (Signed)
   Subjective:    Patient ID: [name] [name], female    DOB: 24-Apr-1961, 53 y.o.   MRN: [id number]  HPI  Here today to followup on hypertension. Last visit Maxzide was discontinued and she was started on HCTZ in losartan 50 mg daily. Amlodipine was continued. I had hoped her blood pressure would be better today but she confesses she really didn't take her medication and told just an hour or so ago. She has been in [place] buying a car today. Recently was evaluated by Dr. Anne Hahn for an episode of double vision. Workup was negative.    Review of Systems     Objective:   Physical Exam  Spent 10 minutes speaking with patient today. She will return in 8 weeks for followup. She will continue to monitor her blood pressure at home.      Assessment & Plan:  Hypertension  Hyperlipidemia-statin intolerant  Plan: Return in 8 weeks

## 2014-01-06 NOTE — Patient Instructions (Addendum)
Continue to take medication regularly and early in the morning. Return in 8 weeks. Monitor blood pressure at home.

## 2014-02-17 ENCOUNTER — Other Ambulatory Visit: Payer: Self-pay | Admitting: Internal Medicine

## 2014-03-04 ENCOUNTER — Encounter: Payer: Self-pay | Admitting: Internal Medicine

## 2014-03-21 ENCOUNTER — Other Ambulatory Visit: Payer: PRIVATE HEALTH INSURANCE | Admitting: Internal Medicine

## 2014-03-21 DIAGNOSIS — Z79899 Other long term (current) drug therapy: Secondary | ICD-10-CM

## 2014-03-21 DIAGNOSIS — I10 Essential (primary) hypertension: Secondary | ICD-10-CM

## 2014-03-21 DIAGNOSIS — E785 Hyperlipidemia, unspecified: Secondary | ICD-10-CM

## 2014-03-21 LAB — LIPID PANEL
CHOL/HDL RATIO: 4.7 ratio
CHOLESTEROL: 259 mg/dL — AB (ref 0–200)
HDL: 55 mg/dL (ref >39)
LDL Cholesterol: 162 mg/dL — ABNORMAL HIGH (ref 0–99)
Triglycerides: 209 mg/dL — ABNORMAL HIGH (ref <150)
VLDL: 42 mg/dL — ABNORMAL HIGH (ref 0–40)

## 2014-03-21 LAB — HEPATIC FUNCTION PANEL
ALT: 26 U/L (ref 0–35)
AST: 27 U/L (ref 0–37)
Albumin: 4.2 g/dL (ref 3.5–5.2)
Alkaline Phosphatase: 96 U/L (ref 39–117)
BILIRUBIN DIRECT: 0.1 mg/dL (ref 0.0–0.3)
Indirect Bilirubin: 0.5 mg/dL (ref 0.2–1.2)
Total Bilirubin: 0.6 mg/dL (ref 0.2–1.2)
Total Protein: 6.4 g/dL (ref 6.0–8.3)

## 2014-03-31 ENCOUNTER — Other Ambulatory Visit: Payer: Self-pay | Admitting: Internal Medicine

## 2014-04-07 ENCOUNTER — Telehealth: Payer: Self-pay

## 2014-04-07 NOTE — Telephone Encounter (Signed)
Spoke to patient regarding her lipitor.  She is only taking it 1-2 times a week.  She does not need a refill at this time.  She will call when needed.

## 2014-04-16 ENCOUNTER — Encounter: Payer: Self-pay | Admitting: Internal Medicine

## 2014-04-16 ENCOUNTER — Ambulatory Visit (INDEPENDENT_AMBULATORY_CARE_PROVIDER_SITE_OTHER): Payer: PRIVATE HEALTH INSURANCE | Admitting: Internal Medicine

## 2014-04-16 VITALS — BP 126/94 | HR 80 | Temp 98.4°F

## 2014-04-16 DIAGNOSIS — I10 Essential (primary) hypertension: Secondary | ICD-10-CM

## 2014-04-16 MED ORDER — LOSARTAN POTASSIUM-HCTZ 100-25 MG PO TABS: 1.0000 | ORAL_TABLET | Freq: Every day | ORAL | Status: DC

## 2014-04-16 MED ORDER — AMLODIPINE BESYLATE 2.5 MG PO TABS: 2.5000 mg | ORAL_TABLET | Freq: Every day | ORAL | Status: DC

## 2014-04-16 NOTE — Patient Instructions (Addendum)
Take amlodipine 2.5 mg daily. Continue HCTZ and losartan as 1 combination pill. Call with blood pressure checks in 2 weeks.

## 2014-04-16 NOTE — Progress Notes (Signed)
   Subjective:    Patient ID: Rito EhrlichJulie Paradise Byers, female    DOB: 06/13/1960, 53 y.o.   MRN: [id number]  HPI  Patient has noticed by 4 or  5 PM that her blood pressure is elevated sometimes as high as 110 diastolically and 150 systolically. She is currently on HCTZ 25 mg daily and losartan 100 mg daily. At one point a few years ago she took amlodipine. Explained to her we could not increase dose of losartan and likely would not get much benefit out of increasing diuretic from 25-50 mg daily.. Therefore think it's better if we add another agent.    Review of Systems     Objective:   Physical Exam  Spent 15 minutes speaking with patient      Assessment & Plan:  Labile hypertension  Plan: Change HCTZ and losartan into 1 tablet which will be Hyzaar 100/25 daily. She will take that around 8 AM. Add amlodipine 2.5 mg daily at 10 AM. Call me with blood pressure checks in the next couple of weeks. Amlodipine may need to be increased to 5 mg daily.

## 2014-05-24 ENCOUNTER — Other Ambulatory Visit: Payer: Self-pay | Admitting: Internal Medicine

## 2014-06-09 ENCOUNTER — Telehealth: Payer: Self-pay | Admitting: Internal Medicine

## 2014-06-09 ENCOUNTER — Ambulatory Visit (INDEPENDENT_AMBULATORY_CARE_PROVIDER_SITE_OTHER): Payer: PRIVATE HEALTH INSURANCE | Admitting: Internal Medicine

## 2014-06-09 ENCOUNTER — Encounter: Payer: Self-pay | Admitting: Internal Medicine

## 2014-06-09 VITALS — BP 120/88 | HR 82 | Temp 98.0°F

## 2014-06-09 DIAGNOSIS — R109 Unspecified abdominal pain: Secondary | ICD-10-CM

## 2014-06-09 DIAGNOSIS — R197 Diarrhea, unspecified: Secondary | ICD-10-CM

## 2014-06-09 DIAGNOSIS — R110 Nausea: Secondary | ICD-10-CM

## 2014-06-09 LAB — CBC WITH DIFFERENTIAL/PLATELET
Basophils Absolute: 0.1 10*3/uL (ref 0.0–0.1)
Basophils Relative: 1 % (ref 0–1)
EOS ABS: 0.1 10*3/uL (ref 0.0–0.7)
Eosinophils Relative: 1 % (ref 0–5)
HEMATOCRIT: 39.5 % (ref 36.0–46.0)
Hemoglobin: 13.9 g/dL (ref 12.0–15.0)
Lymphocytes Relative: 27 % (ref 12–46)
Lymphs Abs: 1.6 10*3/uL (ref 0.7–4.0)
MCH: 31.8 pg (ref 26.0–34.0)
MCHC: 35.2 g/dL (ref 30.0–36.0)
MCV: 90.4 fL (ref 78.0–100.0)
MPV: 10.2 fL (ref 8.6–12.4)
Monocytes Absolute: 0.5 10*3/uL (ref 0.1–1.0)
Monocytes Relative: 9 % (ref 3–12)
NEUTROS ABS: 3.8 10*3/uL (ref 1.7–7.7)
Neutrophils Relative %: 62 % (ref 43–77)
PLATELETS: 221 10*3/uL (ref 150–400)
RBC: 4.37 MIL/uL (ref 3.87–5.11)
RDW: 13.6 % (ref 11.5–15.5)
WBC: 6.1 10*3/uL (ref 4.0–10.5)

## 2014-06-09 LAB — COMPREHENSIVE METABOLIC PANEL
ALK PHOS: 87 U/L (ref 39–117)
ALT: 21 U/L (ref 0–35)
AST: 22 U/L (ref 0–37)
Albumin: 4.3 g/dL (ref 3.5–5.2)
BILIRUBIN TOTAL: 0.4 mg/dL (ref 0.2–1.2)
BUN: 13 mg/dL (ref 6–23)
CHLORIDE: 100 meq/L (ref 96–112)
CO2: 27 mEq/L (ref 19–32)
CREATININE: 0.78 mg/dL (ref 0.50–1.10)
Calcium: 9.2 mg/dL (ref 8.4–10.5)
GLUCOSE: 90 mg/dL (ref 70–99)
Potassium: 3.7 mEq/L (ref 3.5–5.3)
SODIUM: 137 meq/L (ref 135–145)
Total Protein: 6.5 g/dL (ref 6.0–8.3)

## 2014-06-09 LAB — POCT URINALYSIS DIPSTICK
Bilirubin, UA: NEGATIVE
GLUCOSE UA: NEGATIVE
KETONES UA: NEGATIVE
Leukocytes, UA: NEGATIVE
Nitrite, UA: NEGATIVE
PH UA: 6.5
PROTEIN UA: NEGATIVE
RBC UA: NEGATIVE
Spec Grav, UA: 1.015
UROBILINOGEN UA: NEGATIVE

## 2014-06-09 LAB — AMYLASE: Amylase: 39 U/L (ref 0–105)

## 2014-06-09 LAB — LIPASE: Lipase: 34 U/L (ref 0–75)

## 2014-06-09 MED ORDER — ONDANSETRON HCL 4 MG PO TABS: 4.0000 mg | ORAL_TABLET | Freq: Three times a day (TID) | ORAL | Status: DC | PRN

## 2014-06-09 NOTE — Progress Notes (Signed)
   Subjective:    Patient ID: [name] [name], female    DOB: 01/05/1961, 54 y.o.   MRN: [id number]  HPI Patient in today complaining of some right upper quadrant abdominal pain that began sometime in December. She's had some intermittent nausea. No fever or shaking chills. She went to [place] couple of weeks ago. She is nauseated today. She's had some diarrhea recently. Husband is concerned she may have gallstones/cholecystitis. Patient has a long-standing history of what sounds like irritable bowel syndrome. Not unusual to have an episode of diarrhea after eating. No blood in stool. She has a history of hypertension and hyperlipidemia.     Review of Systems     Objective:   Physical Exam  Abdomen is soft nondistended without hepatosplenomegaly masses or significant tenderness today      Assessment & Plan:  Abdominal pain right upper quadrant? Cholecystitis/cholelithiasis  Diarrhea-? Irritable bowel syndrome versus traveler's diarrhea  Plan: Lab work drawn today including CBC , complete metabolic panel, amylase lipase. Urinalysis is normal. She is to have ultrasound of gallbladder which currently is scheduled for February 11. Zofran 4 mg tablets 1 by mouth every 8 hours when necessary nausea. Clear liquids and advance diet slowly.

## 2014-06-09 NOTE — Telephone Encounter (Signed)
[facility] [phone] that Dr. Lenord FellersBaxley would like to see her for OV and possibly draw labs prior to sending her for xrays.  Left potential appt time for today of 12 noon or potential of this afternoon a little later.  Advised to please call us back so we can get her scheduled and in to see Dr. Lenord FellersBaxley this afternoon.  Will await call back from patient to schedule appointment.

## 2014-06-09 NOTE — Patient Instructions (Signed)
Clear liquids for 48 hours. Take Zofran as needed for nausea. Advance diet slowly. Lab work is pending. Ultrasound of gallbladder ordered.

## 2014-06-09 NOTE — Telephone Encounter (Signed)
OV first?

## 2014-06-09 NOTE — Telephone Encounter (Signed)
Acute pain, nausea, diarrhea, (Upper Right Quadrant) Symptoms since Christmas Under her rib Symptoms always come after eating Nausea stays with her pretty much all day long   Wants to know if you'll order an x-ray to see if her gallbladder is enlarged.  She and her husband feel it could be her gallbladder.  Do you want to see her or just order and x-ray?    Please advise.  She can be reached at #  [phone].

## 2014-06-10 ENCOUNTER — Telehealth: Payer: Self-pay | Admitting: *Deleted

## 2014-06-10 NOTE — Telephone Encounter (Signed)
Patient returned call reviewed lab results with patient

## 2014-06-12 ENCOUNTER — Telehealth: Payer: Self-pay | Admitting: *Deleted

## 2014-06-12 ENCOUNTER — Ambulatory Visit
Admission: RE | Admit: 2014-06-12 | Discharge: 2014-06-12 | Disposition: A | Discharge status: Home / Self Care | Payer: PRIVATE HEALTH INSURANCE | Source: Ambulatory Visit | Attending: Internal Medicine | Admitting: Internal Medicine

## 2014-06-12 DIAGNOSIS — R109 Unspecified abdominal pain: Secondary | ICD-10-CM

## 2014-06-13 NOTE — Telephone Encounter (Signed)
Tried to contact patient several times no answer at number on record no answering machine to leave message will continue to call

## 2014-06-18 NOTE — Telephone Encounter (Signed)
Still unable to reach patient at listed number to review US results

## 2014-06-30 ENCOUNTER — Telehealth: Payer: Self-pay | Admitting: *Deleted

## 2014-06-30 NOTE — Telephone Encounter (Signed)
After numerous attempts to reach patient still have been unable to contact her . Will review results of US with patient if she calls us for results

## 2014-07-11 ENCOUNTER — Other Ambulatory Visit: Payer: Self-pay | Admitting: Internal Medicine

## 2014-07-30 ENCOUNTER — Emergency Department (HOSPITAL_COMMUNITY): Payer: PRIVATE HEALTH INSURANCE

## 2014-07-30 ENCOUNTER — Observation Stay (HOSPITAL_COMMUNITY)
Admission: EM | Admit: 2014-07-30 | Discharge: 2014-07-31 | Disposition: A | Discharge status: Home / Self Care | Payer: PRIVATE HEALTH INSURANCE | Attending: Surgery | Admitting: Surgery

## 2014-07-30 ENCOUNTER — Encounter (HOSPITAL_COMMUNITY): Payer: Self-pay

## 2014-07-30 DIAGNOSIS — Z882 Allergy status to sulfonamides status: Secondary | ICD-10-CM | POA: Insufficient documentation

## 2014-07-30 DIAGNOSIS — K3580 Unspecified acute appendicitis: Principal | ICD-10-CM | POA: Insufficient documentation

## 2014-07-30 DIAGNOSIS — Z90710 Acquired absence of both cervix and uterus: Secondary | ICD-10-CM | POA: Diagnosis not present

## 2014-07-30 DIAGNOSIS — I10 Essential (primary) hypertension: Secondary | ICD-10-CM | POA: Diagnosis not present

## 2014-07-30 DIAGNOSIS — K219 Gastro-esophageal reflux disease without esophagitis: Secondary | ICD-10-CM | POA: Diagnosis not present

## 2014-07-30 DIAGNOSIS — E780 Pure hypercholesterolemia: Secondary | ICD-10-CM | POA: Diagnosis not present

## 2014-07-30 DIAGNOSIS — K353 Acute appendicitis with localized peritonitis, without perforation or gangrene: Secondary | ICD-10-CM

## 2014-07-30 DIAGNOSIS — R10813 Right lower quadrant abdominal tenderness: Secondary | ICD-10-CM | POA: Diagnosis present

## 2014-07-30 DIAGNOSIS — I341 Nonrheumatic mitral (valve) prolapse: Secondary | ICD-10-CM | POA: Insufficient documentation

## 2014-07-30 DIAGNOSIS — K37 Unspecified appendicitis: Secondary | ICD-10-CM | POA: Diagnosis present

## 2014-07-30 DIAGNOSIS — Z888 Allergy status to other drugs, medicaments and biological substances status: Secondary | ICD-10-CM | POA: Insufficient documentation

## 2014-07-30 DIAGNOSIS — E785 Hyperlipidemia, unspecified: Secondary | ICD-10-CM | POA: Diagnosis not present

## 2014-07-30 DIAGNOSIS — K5732 Diverticulitis of large intestine without perforation or abscess without bleeding: Secondary | ICD-10-CM

## 2014-07-30 LAB — URINALYSIS, ROUTINE W REFLEX MICROSCOPIC
Bilirubin Urine: NEGATIVE
Glucose, UA: NEGATIVE mg/dL
HGB URINE DIPSTICK: NEGATIVE
KETONES UR: NEGATIVE mg/dL
Leukocytes, UA: NEGATIVE
NITRITE: NEGATIVE
PH: 6.5 (ref 5.0–8.0)
Protein, ur: NEGATIVE mg/dL
SPECIFIC GRAVITY, URINE: 1.019 (ref 1.005–1.030)
UROBILINOGEN UA: 0.2 mg/dL (ref 0.0–1.0)

## 2014-07-30 LAB — COMPREHENSIVE METABOLIC PANEL
ALBUMIN: 4.4 g/dL (ref 3.5–5.2)
ALT: 28 U/L (ref 0–35)
AST: 27 U/L (ref 0–37)
Alkaline Phosphatase: 120 U/L — ABNORMAL HIGH (ref 39–117)
Anion gap: 11 (ref 5–15)
BUN: 21 mg/dL (ref 6–23)
CHLORIDE: 100 mmol/L (ref 96–112)
CO2: 26 mmol/L (ref 19–32)
Calcium: 9.8 mg/dL (ref 8.4–10.5)
Creatinine, Ser: 0.91 mg/dL (ref 0.50–1.10)
GFR calc Af Amer: 82 mL/min — ABNORMAL LOW (ref >90)
GFR, EST NON AFRICAN AMERICAN: 71 mL/min — AB (ref >90)
Glucose, Bld: 105 mg/dL — ABNORMAL HIGH (ref 70–99)
Potassium: 3.6 mmol/L (ref 3.5–5.1)
SODIUM: 137 mmol/L (ref 135–145)
Total Bilirubin: 0.7 mg/dL (ref 0.3–1.2)
Total Protein: 7.8 g/dL (ref 6.0–8.3)

## 2014-07-30 LAB — CBC WITH DIFFERENTIAL/PLATELET
Basophils Absolute: 0 10*3/uL (ref 0.0–0.1)
Basophils Relative: 0 % (ref 0–1)
Eosinophils Absolute: 0.2 10*3/uL (ref 0.0–0.7)
Eosinophils Relative: 3 % (ref 0–5)
HEMATOCRIT: 39.3 % (ref 36.0–46.0)
Hemoglobin: 13.2 g/dL (ref 12.0–15.0)
Lymphocytes Relative: 22 % (ref 12–46)
Lymphs Abs: 1.5 10*3/uL (ref 0.7–4.0)
MCH: 30.7 pg (ref 26.0–34.0)
MCHC: 33.6 g/dL (ref 30.0–36.0)
MCV: 91.4 fL (ref 78.0–100.0)
Monocytes Absolute: 0.4 10*3/uL (ref 0.1–1.0)
Monocytes Relative: 6 % (ref 3–12)
Neutro Abs: 4.7 10*3/uL (ref 1.7–7.7)
Neutrophils Relative %: 69 % (ref 43–77)
Platelets: 172 10*3/uL (ref 150–400)
RBC: 4.3 MIL/uL (ref 3.87–5.11)
RDW: 12.5 % (ref 11.5–15.5)
WBC: 6.9 10*3/uL (ref 4.0–10.5)

## 2014-07-30 MED ORDER — PIPERACILLIN-TAZOBACTAM 3.375 G IVPB 30 MIN
3.3750 g | Freq: Once | INTRAVENOUS | Status: AC
  Administered 2014-07-31: 3.375 g via INTRAVENOUS

## 2014-07-30 MED ORDER — IOHEXOL 300 MG/ML  SOLN
100.0000 mL | Freq: Once | INTRAMUSCULAR | Status: AC | PRN
  Administered 2014-07-30: 100 mL via INTRAVENOUS

## 2014-07-30 MED ORDER — PROPOFOL 10 MG/ML IV BOLUS
INTRAVENOUS | Status: AC
  Filled 2014-07-30: qty 20

## 2014-07-30 MED ORDER — MIDAZOLAM HCL 2 MG/2ML IJ SOLN
INTRAMUSCULAR | Status: AC
  Filled 2014-07-30: qty 2

## 2014-07-30 MED ORDER — SODIUM CHLORIDE 0.9 % IV BOLUS (SEPSIS)
1000.0000 mL | Freq: Once | INTRAVENOUS | Status: AC
Start: 2014-07-30 — End: 2014-07-30
  Administered 2014-07-30: 1000 mL via INTRAVENOUS

## 2014-07-30 MED ORDER — FENTANYL CITRATE 0.05 MG/ML IJ SOLN
INTRAMUSCULAR | Status: AC
  Filled 2014-07-30: qty 5

## 2014-07-30 NOTE — ED Notes (Signed)
Pt complains of right lower quadrant pain since yesterday morning, she describes it as a dull constant pain that is getting worse, she doesn't have her appendix, no vomiting or diarrhea

## 2014-07-30 NOTE — ED Provider Notes (Signed)
CSN: [id number]     Arrival date & time 07/30/14  2011 History   First MD Initiated Contact with Patient 07/30/14 2021     Chief Complaint  Patient presents with  . Abdominal Pain     (Consider location/radiation/quality/duration/timing/severity/associated sxs/prior Treatment) Patient is a 54 y.o. female presenting with abdominal pain. The history is provided by the patient.  Abdominal Pain Pain location:  RLQ Pain quality: aching and shooting   Pain radiates to:  Periumbilical region Pain severity:  Moderate Onset quality:  Gradual Duration:  1 day Timing:  Constant Progression:  Worsening Chronicity:  New Context: not recent illness, not sick contacts and not suspicious food intake   Relieved by:  Nothing Worsened by:  Movement and palpation Ineffective treatments:  None tried Associated symptoms: anorexia   Associated symptoms: no chest pain, no chills, no cough, no diarrhea, no dysuria, no fever, no nausea, no shortness of breath and no vomiting   Risk factors: no alcohol abuse, has not had multiple surgeries and not obese     Past Medical History  Diagnosis Date  . Hyperlipidemia   . Hypertension   . GERD (gastroesophageal reflux disease)   . Generalized headaches   . Diplopia 09/18/2013  . Classic migraine 09/18/2013   Past Surgical History  Procedure Laterality Date  . Abdominal hysterectomy    . Cesarean section      x 2  . Tonsillectomy    . Tendon surgery on elbow     Family History  Problem Relation Age of Onset  . Prostate cancer Father   . Heart disease Father   . Hypertension Father   . Hyperlipidemia Sister   . Hypertension Sister   . Renal cancer Sister   . Alcohol abuse Brother   . Diabetes Brother   . Hypertension Brother    History  Substance Use Topics  . Smoking status: Never Smoker   . Smokeless tobacco: Never Used  . Alcohol Use: 8.4 oz/week    14 Glasses of wine per week     Comment: daily wine   OB History    No data available      Review of Systems  Constitutional: Negative for fever and chills.  Respiratory: Negative for cough and shortness of breath.   Cardiovascular: Negative for chest pain.  Gastrointestinal: Positive for abdominal pain and anorexia. Negative for nausea, vomiting and diarrhea.  Genitourinary: Negative for dysuria.  All other systems reviewed and are negative.     Allergies  Ramipril and Sulfa antibiotics  Home Medications   Prior to Admission medications   Medication Sig Start Date End Date Taking? Authorizing Provider  fluticasone (FLONASE) 50 MCG/ACT nasal spray Place 1 spray into both nostrils daily as needed for allergies or rhinitis (allergies).   Yes Historical Provider, MD  losartan-hydrochlorothiazide (HYZAAR) 100-25 MG per tablet Take 1 tablet by mouth daily. 04/16/14  Yes Margaree Mackintosh, MD  naproxen sodium (ANAPROX) 220 MG tablet Take 220 mg by mouth daily as needed (headache and pain).   Yes Historical Provider, MD  sulfamethoxazole-trimethoprim (BACTRIM DS,SEPTRA DS) 800-160 MG per tablet Take 1 tablet by mouth 2 (two) times daily.   Yes Historical Provider, MD  venlafaxine XR (EFFEXOR-XR) 150 MG 24 hr capsule TAKE ONE CAPSULE BY MOUTH DAILY 12/01/13  Yes Margaree Mackintosh, MD  amLODipine (NORVASC) 2.5 MG tablet TAKE 1 TABLET BY MOUTH DAILY Patient not taking: Reported on 07/30/2014 07/11/14   Margaree Mackintosh, MD  hydrochlorothiazide (HYDRODIURIL) 25  MG tablet TAKE 1 TABLET BY MOUTH DAILY Patient not taking: Reported on 07/30/2014 08/15/13   Margaree MackintoshMary J Baxley, MD  ondansetron (ZOFRAN) 4 MG tablet Take 1 tablet (4 mg total) by mouth every 8 (eight) hours as needed for nausea or vomiting. Patient not taking: Reported on 07/30/2014 06/09/14   Margaree MackintoshMary J Baxley, MD   BP 132/93 mmHg  Pulse 89  Temp(Src) 97.8 F (36.6 C) (Oral)  Resp 18  Ht 5\' 5"  (1.651 m)  Wt 168 lb (76.204 kg)  BMI 27.96 kg/m2  SpO2 93% Physical Exam  Constitutional: She is oriented to person, place, and time. She appears  well-developed and well-nourished. No distress.  HENT:  Head: Normocephalic and atraumatic.  Mouth/Throat: Oropharynx is clear and moist.  Eyes: Conjunctivae and EOM are normal. Pupils are equal, round, and reactive to light.  Neck: Normal range of motion. Neck supple.  Cardiovascular: Normal rate, regular rhythm and intact distal pulses.   No murmur heard. Pulmonary/Chest: Effort normal and breath sounds normal. No respiratory distress. She has no wheezes. She has no rales.  Abdominal: Soft. Normal appearance. She exhibits no distension. There is no hepatosplenomegaly. There is tenderness in the right lower quadrant. There is guarding and tenderness at McBurney's point. There is no rebound and no CVA tenderness.  Musculoskeletal: Normal range of motion. She exhibits no edema or tenderness.  Neurological: She is alert and oriented to person, place, and time.  Skin: Skin is warm and dry. No rash noted. No erythema.  Psychiatric: She has a normal mood and affect. Her behavior is normal.  Nursing note and vitals reviewed.   ED Course  Procedures (including critical care time) Labs Review Labs Reviewed  COMPREHENSIVE METABOLIC PANEL - Abnormal; Notable for the following:    Glucose, Bld 105 (*)    Alkaline Phosphatase 120 (*)    GFR calc non Af Amer 71 (*)    GFR calc Af Amer 82 (*)    All other components within normal limits  CBC WITH DIFFERENTIAL/PLATELET  CBC WITH DIFFERENTIAL/PLATELET  URINALYSIS, ROUTINE W REFLEX MICROSCOPIC    Imaging Review Ct Abdomen Pelvis W Contrast  07/30/2014   CLINICAL DATA:  Acute onset right lower quadrant abdominal pain for 2 days. Initial encounter.  EXAM: CT ABDOMEN AND PELVIS WITH CONTRAST  TECHNIQUE: Multidetector CT imaging of the abdomen and pelvis was performed using the standard protocol following bolus administration of intravenous contrast.  CONTRAST:  100mL OMNIPAQUE IOHEXOL 300 MG/ML  SOLN  COMPARISON:  Right upper quadrant ultrasound  performed 06/12/2014  FINDINGS: Bibasilar atelectasis is noted. Bilateral breast implants are noted.  There are vague nonspecific hypoattenuating lesions within the right hepatic lobe and caudate lobe, measuring 6.0 cm, 4.1 cm, 3.9 cm and 2.5 cm in size. These are of uncertain significance. Dynamic liver protocol MRI or CT would be helpful for further evaluation, and would correlate with LFTs.  The gallbladder is within normal limits. The pancreas and adrenal glands are unremarkable. Mild calcification is noted along the splenic artery at the body of the pancreas.  A tiny 2 mm stone is noted at the lower pole of the left kidney. The kidneys are otherwise unremarkable in appearance. There is no evidence of hydronephrosis. No obstructing ureteral stones are seen. No perinephric stranding is appreciated.  No free fluid is identified. The small bowel is unremarkable in appearance. The stomach is within normal limits. No acute vascular abnormalities are seen.  The appendix is dilated to 1.1 cm in maximal diameter,  with surrounding soft tissue inflammation and associated wall thickening. There is no evidence of perforation or abscess formation. This is compatible with acute appendicitis.  Scattered diverticulosis is noted along the ascending, distal transverse, descending and sigmoid colon. There is focal soft tissue inflammation about the mid sigmoid colon, with inflamed diverticula. Inflammation extends about the adjacent left ovary. Findings are compatible with acute diverticulitis.  The bladder is moderately distended and grossly unremarkable in appearance. The patient is status post hysterectomy. A right adnexal follicle is noted. There is slight asymmetric prominence of the right ovary, though this is thought to remain within normal limits. Soft tissue inflammation is noted about the left ovary, as described above. No inguinal lymphadenopathy is seen.  A small right inguinal hernia is seen, containing only fat.  No  acute osseous abnormalities are identified.  IMPRESSION: 1. Acute appendicitis, with dilatation of the appendix to 1.1 cm in maximal diameter, surrounding soft tissue inflammation and wall thickening. No evidence of perforation or abscess formation. 2. Acute diverticulitis at the mid sigmoid colon, with focal soft tissue inflammation about the mid sigmoid colon and right diverticula. Inflammation extends about the adjacent left ovary. 3. Scattered diverticulosis along the ascending, distal transverse, descending and sigmoid colon. 4. Slight asymmetric prominence of the right ovary, though this is thought to remain within normal limits. At time of surgery for appendicitis, visual assessment of the nearby right ovary might be helpful, as deemed clinically appropriate. 5. Multiple large hypoattenuating nonspecific lesions within the right hepatic lobe and caudate lobe, measuring 6.0 cm, 4.1 cm, 3.9 cm and 2.5 cm. These are of uncertain significance. Malignancy cannot be entirely excluded. Dynamic liver protocol MRI or CT is recommended for further evaluation, when and as deemed clinically appropriate. Would correlate with LFTs. 6. Small right inguinal hernia, containing only fat. 7. Nonobstructing 2 mm stone at the lower pole of the left kidney. 8. Bibasilar atelectasis noted.  These results were called by telephone at the time of interpretation on 07/30/2014 at 10:49 pm to Dr. Gwyneth Sprout, who verbally acknowledged these results.   Electronically Signed   By: Roanna Raider M.D.   On: 07/30/2014 22:49     EKG Interpretation None      MDM   Final diagnoses:  RLQ abdominal tenderness  Acute appendicitis with localized peritonitis  Sigmoid diverticulitis    Patient with a history and exam most concerning for appendicitis. 36 hours of abdominal pain that is localized to the right lower quadrant worse with movement and palpation. No fever, diarrhea, urinary symptoms or vomiting at this time. Patient has  had a hysterectomy but no other abdominal surgeries. Low suspicion for ovarian torsion at this time. The suspicion for kidney stone as patient is having new urinary complaints or flank tenderness. No right upper quadrant tenderness or Murphy's sign concerning for Colace cystitis. Also patient states approximately about a month or 2 ago she received an ultrasound that was negative for gallstones.  CBC, CMP, UA, CT of the abdomen and pelvis pending. Patient refusing pain control at this time  10:44 PM Labs wnl.  CT consistent with appy and diverticulitis.  Discussed with Dr. Ezzard Standing.  Will admit for further care.  Gwyneth Sprout, MD 07/30/14 [phone]

## 2014-07-30 NOTE — H&P (Addendum)
Re:   [name] [name] DOB:   July 01, 1960 MRN:   [id number]  ASSESSMENT AND PLAN: 1.  Appendicitis  I discussed with the patient the indications and risks of appendiceal surgery.  The primary risks of appendiceal surgery include, but are not limited to, bleeding, infection, bowel surgery, and open surgery.  There is also the risk that the patient may have continued symptoms after surgery.  However, the likelihood of improvement in symptoms and return to the patient's normal status is good. We discussed the typical post-operative recovery course. I tried to answer the patient's questions.  2.  Diverticulitis -  Sigmoid colon  Will need prolonged antibiotics post op to treat this 3.  HTN 4.  Hypoattenuating lesions of the right lobe and caudate lobe of liver  She has a mildly elevated Alk Phos  I gave her husband copies of the CT scan. 5.  2 mm stone of lower pole of left kidney 6.  Remote history of seeing Dr. Ephraim Hamburger for MVP  On no meds and no recent problems 7.  Hypercholesterolemia  Chief Complaint  Patient presents with  . Abdominal Pain   REFERRING PHYSICIAN:  Dr. Gwyneth Sprout, [facility]  HISTORY OF PRESENT ILLNESS: [name] [name] is a 54 y.o. (DOB: 1960-12-08)  white  female whose primary care physician is [name] Mackintosh, [name] [name] and comes to the [facility] today for abdominal pain.  Her husband called me at home tonight concerned that his wife, [name] [name], may have appendicitis.  I directed him to the [facility].  Interestingly, she had some abdominal pain recently evaluated by Dr. Tina Griffiths. Baxley, her PCP.  She had gone to [place] in January and came back with some diarrhea.  She said that her husband has this worse than she had it. The pain a couple of months ago was more RUQ and an abdominal US was negative on 06/12/2014.  There was a mention of "heterogenous hepatic echotexture" on [place].  She had some LLQ pain about 2 weeks ago.  She had no fever and this seemed to get better on its  own.  This acute pain started yesterday AM.  It has gotten steadily worse, localized to the RLQ.  She has no nausea, but she has lost her appetite.  She had a colonoscopy by Dr. Berton Lan about 2 years ago.  She was noted to have some diverticulosis.  Her CT scan of abdomen/pelvis - Acute appendicitis, with dilatation of the appendix to 1.1 cm in maximal diameter, surrounding soft tissue inflammation and wall thickening. No evidence of perforation or abscess formation.    2. Acute diverticulitis at the mid sigmoid colon, with focal soft tissue inflammation about the mid sigmoid colon and right diverticula. Inflammation extends about the adjacent left ovary.  3. Scattered diverticulosis along the ascending, distal transverse, descending and sigmoid colon.  4. Slight asymmetric prominence of the right ovary, though this is thought to remain within normal limits. At time of surgery for appendicitis, visual assessment of the nearby right ovary might be helpful, as deemed clinically appropriate.  5. Multiple large hypoattenuating nonspecific lesions within the right hepatic lobe and caudate lobe, measuring 6.0 cm, 4.1 cm, 3.9 cm and 2.5 cm. These are of uncertain significance. Malignancy cannot be entirely excluded. Dynamic liver protocol MRI or CT is recommended for further evaluation, when and as deemed clinically appropriate. Would correlate with LFTs.  6. Small right inguinal hernia, containing only fat.  7. Nonobstructing 2 mm stone at the lower  pole of the left kidney.  8. Bibasilar atelectasis noted.  Her WBC is 6,900 - 07/30/2014    Past Medical History  Diagnosis Date  . Hyperlipidemia   . Hypertension   . GERD (gastroesophageal reflux disease)   . Generalized headaches   . Diplopia 09/18/2013  . Classic migraine 09/18/2013      Past Surgical History  Procedure Laterality Date  . Abdominal hysterectomy    . Cesarean section      x 2  . Tonsillectomy    . Tendon surgery on elbow         No current facility-administered medications for this encounter.   Current Outpatient Prescriptions  Medication Sig Dispense Refill  . fluticasone (FLONASE) 50 MCG/ACT nasal spray Place 1 spray into both nostrils daily as needed for allergies or rhinitis (allergies).    . losartan-hydrochlorothiazide (HYZAAR) 100-25 MG per tablet Take 1 tablet by mouth daily. 90 tablet 3  . naproxen sodium (ANAPROX) 220 MG tablet Take 220 mg by mouth daily as needed (headache and pain).    Marland Kitchen sulfamethoxazole-trimethoprim (BACTRIM DS,SEPTRA DS) 800-160 MG per tablet Take 1 tablet by mouth 2 (two) times daily.    Marland Kitchen venlafaxine XR (EFFEXOR-XR) 150 MG 24 hr capsule TAKE ONE CAPSULE BY MOUTH DAILY 90 capsule PRN  . amLODipine (NORVASC) 2.5 MG tablet TAKE 1 TABLET BY MOUTH DAILY (Patient not taking: Reported on 07/30/2014) 30 tablet 3  . hydrochlorothiazide (HYDRODIURIL) 25 MG tablet TAKE 1 TABLET BY MOUTH DAILY (Patient not taking: Reported on 07/30/2014) 90 tablet 1  . ondansetron (ZOFRAN) 4 MG tablet Take 1 tablet (4 mg total) by mouth every 8 (eight) hours as needed for nausea or vomiting. (Patient not taking: Reported on 07/30/2014) 20 tablet 0      Allergies  Allergen Reactions  . Ramipril Cough  . Sulfa Antibiotics Other (See Comments)    Itching.    REVIEW OF SYSTEMS: Skin:  No history of rash.  No history of abnormal moles. Infection:  No history of hepatitis or HIV.  No history of MRSA.  Left axillary lymph node with granuloma - 2014.  Saw Dr. Joya Gaskins.  Nothing came of this. Neurologic:  No history of stroke.  No history of seizure.  No history of headaches. Cardiac:  HTN.  She saw Dr. Waunita Schooner for possible MVP about 10-15 years ago.  No further problems. Pulmonary:  Does not smoke cigarettes.  No asthma or bronchitis.  No OSA/CPAP. Breasts:  She has had bilateral breast implants by Dr. Harlow Mares.  No breast problems.  Endocrine:  No diabetes. No thyroid disease. Gastrointestinal:  See HPI.  No  history of stomach disease.  No history of liver disease.   No history of pancreas disease.   She's had an abdominoplasty by Dr. Harlow Mares. Urologic:  No history of kidney stones.  No history of bladder infections. Musculoskeletal:  Arthrotomy right elbow - [place]her - 08/2010. GYN:  She has had a hysterectomy.  Dr. Renold Genta checked her hormones about a year ago and it seemed her ovaries were still functioning.  She had a C section x 2 Hematologic:  No bleeding disorder.  No history of anemia.  Not anticoagulated. Psycho-social:  The patient is oriented.   The patient has no obvious psychologic or social impairment to understanding our conversation and plan.  SOCIAL and FAMILY HISTORY: Married. Husband, [name] [name], radiologist. She has 2 children:  102 and 98 (senior at [employer]) Their anniversary is next week and they are heading  to [employer] [facility] [employer] [employer].  She is also training for a bike run this summer and they are going biking in [place] this fall. Her grandmother had ovarian cancer.  Her sister died from cholangiocarcinoma.  PHYSICAL EXAM: BP 138/96 mmHg  Pulse 72  Temp(Src) 97.8 F (36.6 C) (Oral)  Resp 18  Ht $R'5\' 5"'GI$  (1.651 m)  Wt 76.204 kg (168 lb)  BMI 27.96 kg/m2  SpO2 95%  General: WN WF who is alert and generally healthy appearing.  HEENT: Normal. Pupils equal. Neck: Supple. No mass.  No thyroid mass. Lymph Nodes:  No supraclavicular or cervical nodes. Lungs: Clear to auscultation and symmetric breath sounds. Heart:  RRR. No murmur or rub. Breasts:  Right - scars of mammoplasty  Left - scars of mammoplasty  Abdomen: Soft. No mass.  No hernia. Decreased bowel sounds.  Abdominoplasty scar and periumbilical scar.   Tenderness in the RLQ.   Rectal: Not done. Extremities:  Good strength and ROM  in upper and lower extremities. Neurologic:  Grossly intact to motor and sensory function. Psychiatric: Has normal mood and affect. Behavior is normal.   DATA REVIEWED: Epic notes.  Alphonsa Overall, [name] [name],   [facility], [address]  [place], [place]    [place] Phone:  [phone] FAX:  [phone]

## 2014-07-31 ENCOUNTER — Encounter (HOSPITAL_COMMUNITY): Payer: Self-pay | Admitting: Anesthesiology

## 2014-07-31 ENCOUNTER — Emergency Department (HOSPITAL_COMMUNITY): Payer: PRIVATE HEALTH INSURANCE | Admitting: Anesthesiology

## 2014-07-31 ENCOUNTER — Ambulatory Visit: Payer: PRIVATE HEALTH INSURANCE | Admitting: Internal Medicine

## 2014-07-31 ENCOUNTER — Encounter (HOSPITAL_COMMUNITY)
Admission: EM | Disposition: A | Discharge status: Home / Self Care | Payer: Self-pay | Source: Home / Self Care | Attending: Emergency Medicine

## 2014-07-31 DIAGNOSIS — K37 Unspecified appendicitis: Secondary | ICD-10-CM | POA: Diagnosis present

## 2014-07-31 HISTORY — PX: LAPAROSCOPIC APPENDECTOMY: SHX408

## 2014-07-31 SURGERY — APPENDECTOMY, LAPAROSCOPIC
Anesthesia: General | Site: Abdomen

## 2014-07-31 MED ORDER — ROCURONIUM BROMIDE 100 MG/10ML IV SOLN
INTRAVENOUS | Status: DC | PRN
  Administered 2014-07-31 (×2): 10 mg via INTRAVENOUS
  Administered 2014-07-31: 40 mg via INTRAVENOUS

## 2014-07-31 MED ORDER — MORPHINE SULFATE 2 MG/ML IJ SOLN: 1.0000 mg | INTRAMUSCULAR | Status: DC | PRN

## 2014-07-31 MED ORDER — HEPARIN SODIUM (PORCINE) 5000 UNIT/ML IJ SOLN
5000.0000 [IU] | Freq: Three times a day (TID) | INTRAMUSCULAR | Status: DC
  Filled 2014-07-31 (×3): qty 1

## 2014-07-31 MED ORDER — KETOROLAC TROMETHAMINE 30 MG/ML IJ SOLN: 30.0000 mg | Freq: Once | INTRAMUSCULAR | Status: DC | PRN

## 2014-07-31 MED ORDER — HYDROCODONE-ACETAMINOPHEN 5-325 MG PO TABS: 1.0000 | ORAL_TABLET | Freq: Four times a day (QID) | ORAL | Status: DC | PRN

## 2014-07-31 MED ORDER — PROMETHAZINE HCL 25 MG/ML IJ SOLN
6.2500 mg | INTRAMUSCULAR | Status: DC | PRN
Start: 2014-07-31 — End: 2014-07-31
  Administered 2014-07-31: 6.25 mg via INTRAVENOUS

## 2014-07-31 MED ORDER — PROPOFOL 10 MG/ML IV BOLUS
INTRAVENOUS | Status: DC | PRN
  Administered 2014-07-31: 150 mg via INTRAVENOUS

## 2014-07-31 MED ORDER — PIPERACILLIN-TAZOBACTAM 3.375 G IVPB
INTRAVENOUS | Status: AC
  Filled 2014-07-31: qty 50

## 2014-07-31 MED ORDER — GLYCOPYRROLATE 0.2 MG/ML IJ SOLN
INTRAMUSCULAR | Status: AC
  Filled 2014-07-31: qty 1

## 2014-07-31 MED ORDER — ONDANSETRON HCL 4 MG/2ML IJ SOLN: 4.0000 mg | Freq: Four times a day (QID) | INTRAMUSCULAR | Status: DC | PRN

## 2014-07-31 MED ORDER — MIDAZOLAM HCL 5 MG/5ML IJ SOLN
INTRAMUSCULAR | Status: DC | PRN
  Administered 2014-07-31: 2 mg via INTRAVENOUS

## 2014-07-31 MED ORDER — HYDROCHLOROTHIAZIDE 25 MG PO TABS
25.0000 mg | ORAL_TABLET | Freq: Every day | ORAL | Status: DC
  Filled 2014-07-31: qty 1

## 2014-07-31 MED ORDER — AMLODIPINE BESYLATE 2.5 MG PO TABS
2.5000 mg | ORAL_TABLET | Freq: Every day | ORAL | Status: DC
  Filled 2014-07-31: qty 1

## 2014-07-31 MED ORDER — 0.9 % SODIUM CHLORIDE (POUR BTL) OPTIME
TOPICAL | Status: DC | PRN
  Administered 2014-07-31: 1000 mL

## 2014-07-31 MED ORDER — BUPIVACAINE HCL (PF) 0.25 % IJ SOLN
INTRAMUSCULAR | Status: DC | PRN
  Administered 2014-07-31: 30 mL

## 2014-07-31 MED ORDER — KCL IN DEXTROSE-NACL 20-5-0.45 MEQ/L-%-% IV SOLN
INTRAVENOUS | Status: DC
  Administered 2014-07-31: 05:00:00 via INTRAVENOUS
  Filled 2014-07-31 (×2): qty 1000

## 2014-07-31 MED ORDER — HYDROMORPHONE HCL 1 MG/ML IJ SOLN: 0.2500 mg | INTRAMUSCULAR | Status: DC | PRN

## 2014-07-31 MED ORDER — ROCURONIUM BROMIDE 100 MG/10ML IV SOLN
INTRAVENOUS | Status: AC
  Filled 2014-07-31: qty 1

## 2014-07-31 MED ORDER — GLYCOPYRROLATE 0.2 MG/ML IJ SOLN
INTRAMUSCULAR | Status: AC
  Filled 2014-07-31: qty 3

## 2014-07-31 MED ORDER — FENTANYL CITRATE 0.05 MG/ML IJ SOLN
INTRAMUSCULAR | Status: DC | PRN
  Administered 2014-07-31 (×2): 50 ug via INTRAVENOUS
  Administered 2014-07-31: 100 ug via INTRAVENOUS
  Administered 2014-07-31: 50 ug via INTRAVENOUS

## 2014-07-31 MED ORDER — ONDANSETRON HCL 4 MG/2ML IJ SOLN
INTRAMUSCULAR | Status: AC
Start: 2014-07-31 — End: 2014-07-31
  Filled 2014-07-31: qty 2

## 2014-07-31 MED ORDER — LACTATED RINGERS IV SOLN
INTRAVENOUS | Status: DC | PRN
  Administered 2014-07-31 (×2): via INTRAVENOUS

## 2014-07-31 MED ORDER — SODIUM CHLORIDE 0.9 % IJ SOLN
INTRAMUSCULAR | Status: AC
  Filled 2014-07-31: qty 10

## 2014-07-31 MED ORDER — OXYCODONE HCL 5 MG/5ML PO SOLN
5.0000 mg | Freq: Once | ORAL | Status: DC | PRN
  Filled 2014-07-31: qty 5

## 2014-07-31 MED ORDER — EPHEDRINE SULFATE 50 MG/ML IJ SOLN
INTRAMUSCULAR | Status: DC | PRN
  Administered 2014-07-31 (×2): 10 mg via INTRAVENOUS

## 2014-07-31 MED ORDER — ONDANSETRON HCL 4 MG/2ML IJ SOLN
INTRAMUSCULAR | Status: DC | PRN
  Administered 2014-07-31: 4 mg via INTRAVENOUS

## 2014-07-31 MED ORDER — PROMETHAZINE HCL 25 MG/ML IJ SOLN
INTRAMUSCULAR | Status: AC
  Filled 2014-07-31: qty 1

## 2014-07-31 MED ORDER — KETOROLAC TROMETHAMINE 30 MG/ML IJ SOLN
INTRAMUSCULAR | Status: AC
  Filled 2014-07-31: qty 1

## 2014-07-31 MED ORDER — PHENYLEPHRINE HCL 10 MG/ML IJ SOLN
INTRAMUSCULAR | Status: DC | PRN
  Administered 2014-07-31 (×2): 40 ug via INTRAVENOUS

## 2014-07-31 MED ORDER — PHENYLEPHRINE 40 MCG/ML (10ML) SYRINGE FOR IV PUSH (FOR BLOOD PRESSURE SUPPORT)
PREFILLED_SYRINGE | INTRAVENOUS | Status: AC
  Filled 2014-07-31: qty 10

## 2014-07-31 MED ORDER — LACTATED RINGERS IR SOLN
Status: DC | PRN
  Administered 2014-07-31: 2000 mL
  Administered 2014-07-31: 1

## 2014-07-31 MED ORDER — AMOXICILLIN-POT CLAVULANATE 875-125 MG PO TABS: 1.0000 | ORAL_TABLET | Freq: Two times a day (BID) | ORAL | Status: DC

## 2014-07-31 MED ORDER — BUPIVACAINE HCL (PF) 0.25 % IJ SOLN
INTRAMUSCULAR | Status: AC
  Filled 2014-07-31: qty 30

## 2014-07-31 MED ORDER — IBUPROFEN 200 MG PO TABS: 600.0000 mg | ORAL_TABLET | Freq: Four times a day (QID) | ORAL | Status: DC | PRN

## 2014-07-31 MED ORDER — LIDOCAINE HCL (CARDIAC) 20 MG/ML IV SOLN
INTRAVENOUS | Status: AC
  Filled 2014-07-31: qty 5

## 2014-07-31 MED ORDER — CETYLPYRIDINIUM CHLORIDE 0.05 % MT LIQD: 7.0000 mL | Freq: Two times a day (BID) | OROMUCOSAL | Status: DC

## 2014-07-31 MED ORDER — EPHEDRINE SULFATE 50 MG/ML IJ SOLN
INTRAMUSCULAR | Status: AC
  Filled 2014-07-31: qty 1

## 2014-07-31 MED ORDER — HYDROMORPHONE HCL 1 MG/ML IJ SOLN
INTRAMUSCULAR | Status: DC | PRN
  Administered 2014-07-31: 1 mg via INTRAVENOUS

## 2014-07-31 MED ORDER — PIPERACILLIN-TAZOBACTAM 3.375 G IVPB
3.3750 g | Freq: Three times a day (TID) | INTRAVENOUS | Status: DC
  Administered 2014-07-31: 3.375 g via INTRAVENOUS
  Filled 2014-07-31 (×2): qty 50

## 2014-07-31 MED ORDER — LIDOCAINE HCL (CARDIAC) 20 MG/ML IV SOLN
INTRAVENOUS | Status: DC | PRN
  Administered 2014-07-31: 50 mg via INTRAVENOUS

## 2014-07-31 MED ORDER — ONDANSETRON HCL 4 MG PO TABS: 4.0000 mg | ORAL_TABLET | Freq: Four times a day (QID) | ORAL | Status: DC | PRN

## 2014-07-31 MED ORDER — NEOSTIGMINE METHYLSULFATE 10 MG/10ML IV SOLN
INTRAVENOUS | Status: DC | PRN
  Administered 2014-07-31: 3.5 mg via INTRAVENOUS

## 2014-07-31 MED ORDER — PROMETHAZINE HCL 25 MG/ML IJ SOLN
6.2500 mg | INTRAMUSCULAR | Status: DC | PRN
  Administered 2014-07-31: 12.5 mg via INTRAVENOUS

## 2014-07-31 MED ORDER — LACTATED RINGERS IV SOLN: INTRAVENOUS | Status: DC

## 2014-07-31 MED ORDER — GLYCOPYRROLATE 0.2 MG/ML IJ SOLN
INTRAMUSCULAR | Status: DC | PRN
  Administered 2014-07-31: .8 mg via INTRAVENOUS

## 2014-07-31 MED ORDER — HYDROCODONE-ACETAMINOPHEN 5-325 MG PO TABS: 1.0000 | ORAL_TABLET | ORAL | Status: DC | PRN

## 2014-07-31 MED ORDER — OXYCODONE HCL 5 MG PO TABS: 5.0000 mg | ORAL_TABLET | Freq: Once | ORAL | Status: DC | PRN

## 2014-07-31 MED ORDER — HYDROMORPHONE HCL 2 MG/ML IJ SOLN
INTRAMUSCULAR | Status: AC
  Filled 2014-07-31: qty 1

## 2014-07-31 SURGICAL SUPPLY — 46 items
APL SKNCLS STERI-STRIP NONHPOA (GAUZE/BANDAGES/DRESSINGS)
APPLIER CLIP ROT 10 11.4 M/L (STAPLE) ×2
APR CLP MED LRG 11.4X10 (STAPLE) ×1
BAG SPEC RTRVL LRG 6X4 10 (ENDOMECHANICALS) ×1
BENZOIN TINCTURE PRP APPL 2/3 (GAUZE/BANDAGES/DRESSINGS) ×1 IMPLANT
CLIP APPLIE ROT 10 11.4 M/L (STAPLE) IMPLANT
CUTTER FLEX LINEAR 45M (STAPLE) ×1 IMPLANT
DECANTER SPIKE VIAL GLASS SM (MISCELLANEOUS) ×1 IMPLANT
DEVICE TROCAR PUNCTURE CLOSURE (ENDOMECHANICALS) ×1 IMPLANT
DRAPE LAPAROSCOPIC ABDOMINAL (DRAPES) ×2 IMPLANT
ELECT REM PT RETURN 9FT ADLT (ELECTROSURGICAL) ×2
ELECTRODE REM PT RTRN 9FT ADLT (ELECTROSURGICAL) ×1 IMPLANT
ENDOLOOP SUT PDS II  0 18 (SUTURE)
ENDOLOOP SUT PDS II 0 18 (SUTURE) IMPLANT
GLOVE BIOGEL PI IND STRL 7.0 (GLOVE) ×1 IMPLANT
GLOVE BIOGEL PI INDICATOR 7.0 (GLOVE)
GLOVE SURG SIGNA 7.5 PF LTX (GLOVE) ×3 IMPLANT
GOWN SPEC L4 XLG W/TWL (GOWN DISPOSABLE) ×2 IMPLANT
GOWN STRL REUS W/ TWL XL LVL3 (GOWN DISPOSABLE) ×3 IMPLANT
GOWN STRL REUS W/TWL LRG LVL3 (GOWN DISPOSABLE) ×2 IMPLANT
GOWN STRL REUS W/TWL XL LVL3 (GOWN DISPOSABLE) ×2
KIT BASIN OR (CUSTOM PROCEDURE TRAY) ×2 IMPLANT
LIQUID BAND (GAUZE/BANDAGES/DRESSINGS) IMPLANT
PENCIL BUTTON HOLSTER BLD 10FT (ELECTRODE) IMPLANT
POUCH SPECIMEN RETRIEVAL 10MM (ENDOMECHANICALS) ×2 IMPLANT
RELOAD 45 VASCULAR/THIN (ENDOMECHANICALS) IMPLANT
RELOAD STAPLE 45 2.5 WHT GRN (ENDOMECHANICALS) IMPLANT
RELOAD STAPLE 45 3.5 BLU ETS (ENDOMECHANICALS) IMPLANT
RELOAD STAPLE TA45 3.5 REG BLU (ENDOMECHANICALS) ×2 IMPLANT
SET IRRIG TUBING LAPAROSCOPIC (IRRIGATION / IRRIGATOR) ×2 IMPLANT
SHEARS HARMONIC ACE PLUS 36CM (ENDOMECHANICALS) ×2 IMPLANT
SLEEVE XCEL OPT CAN 5 100 (ENDOMECHANICALS) ×1 IMPLANT
SOLUTION ANTI FOG 6CC (MISCELLANEOUS) ×1 IMPLANT
STRIP CLOSURE SKIN 1/2X4 (GAUZE/BANDAGES/DRESSINGS) ×1 IMPLANT
SUT MON AB 5-0 PS2 18 (SUTURE) ×2 IMPLANT
SUT VIC AB 2-0 SH 27 (SUTURE) ×2
SUT VIC AB 2-0 SH 27X BRD (SUTURE) IMPLANT
TOWEL OR 17X26 10 PK STRL BLUE (TOWEL DISPOSABLE) ×2 IMPLANT
TOWEL OR NON WOVEN STRL DISP B (DISPOSABLE) ×2 IMPLANT
TRAY FOLEY CATH 14FRSI W/METER (CATHETERS) ×2 IMPLANT
TRAY LAPAROSCOPIC (CUSTOM PROCEDURE TRAY) ×2 IMPLANT
TROCAR BLADELESS OPT 5 100 (ENDOMECHANICALS) ×2 IMPLANT
TROCAR XCEL 12X100 BLDLESS (ENDOMECHANICALS) ×1 IMPLANT
TROCAR XCEL BLUNT TIP 100MML (ENDOMECHANICALS) ×2 IMPLANT
TROCAR XCEL NON-BLD 11X100MML (ENDOMECHANICALS) ×2 IMPLANT
TUBING INSUFFLATION 10FT LAP (TUBING) ×2 IMPLANT

## 2014-07-31 NOTE — Progress Notes (Signed)
Discharge instructions along with prescriptions given to patient.  Questions answered

## 2014-07-31 NOTE — Discharge Instructions (Signed)

## 2014-07-31 NOTE — Discharge Summary (Signed)
Physician Discharge Summary  Patient ID: [name] [name] MRN: [id number] DOB/AGE: June 03, 1960 54 y.o.  Admit date: 07/30/2014 Discharge date: 07/31/2014  Admitting Diagnosis: Acute appendicitis Diverticulitis Hypertension hypo attenuating lesions of the right liver lobe and caudate lobe of the liver    Discharge Diagnosis Patient Active Problem List   Diagnosis Date Noted  . Appendicitis 07/31/2014  . Diplopia 09/18/2013  . Classic migraine 09/18/2013  . Sarcoidosis 07/25/2012  . Anxiety 07/10/2011  . Mood disorder 07/03/2011  . Hypertension 07/03/2011  . History of migraine headaches 07/03/2011  . HYPERLIPIDEMIA-MIXED 11/27/2008    Consultants none  Imaging: Ct Abdomen Pelvis W Contrast  07/30/2014   CLINICAL DATA:  Acute onset right lower quadrant abdominal pain for 2 days. Initial encounter.  EXAM: CT ABDOMEN AND PELVIS WITH CONTRAST  TECHNIQUE: Multidetector CT imaging of the abdomen and pelvis was performed using the standard protocol following bolus administration of intravenous contrast.  CONTRAST:  OMNIPAQUE IOHEXOL 300 MG/ML  SOLN  COMPARISON:  Right upper quadrant ultrasound performed 06/12/2014  FINDINGS: Bibasilar atelectasis is noted. Bilateral breast implants are noted.  There are vague nonspecific hypoattenuating lesions within the right hepatic lobe and caudate lobe, measuring 6.0 cm, 4.1 cm, 3.9 cm and 2.5 cm in size. These are of uncertain significance. Dynamic liver protocol MRI or CT would be helpful for further evaluation, and would correlate with LFTs.  The gallbladder is within normal limits. The pancreas and adrenal glands are unremarkable. Mild calcification is noted along the splenic artery at the body of the pancreas.  A tiny 2 mm stone is noted at the lower pole of the left kidney. The kidneys are otherwise unremarkable in appearance. There is no evidence of hydronephrosis. No obstructing ureteral stones are seen. No perinephric stranding is  appreciated.  No free fluid is identified. The small bowel is unremarkable in appearance. The stomach is within normal limits. No acute vascular abnormalities are seen.  The appendix is dilated to 1.1 cm in maximal diameter, with surrounding soft tissue inflammation and associated wall thickening. There is no evidence of perforation or abscess formation. This is compatible with acute appendicitis.  Scattered diverticulosis is noted along the ascending, distal transverse, descending and sigmoid colon. There is focal soft tissue inflammation about the mid sigmoid colon, with inflamed diverticula. Inflammation extends about the adjacent left ovary. Findings are compatible with acute diverticulitis.  The bladder is moderately distended and grossly unremarkable in appearance. The patient is status post hysterectomy. A right adnexal follicle is noted. There is slight asymmetric prominence of the right ovary, though this is thought to remain within normal limits. Soft tissue inflammation is noted about the left ovary, as described above. No inguinal lymphadenopathy is seen.  A small right inguinal hernia is seen, containing only fat.  No acute osseous abnormalities are identified.  IMPRESSION: 1. Acute appendicitis, with dilatation of the appendix to 1.1 cm in maximal diameter, surrounding soft tissue inflammation and wall thickening. No evidence of perforation or abscess formation. 2. Acute diverticulitis at the mid sigmoid colon, with focal soft tissue inflammation about the mid sigmoid colon and right diverticula. Inflammation extends about the adjacent left ovary. 3. Scattered diverticulosis along the ascending, distal transverse, descending and sigmoid colon. 4. Slight asymmetric prominence of the right ovary, though this is thought to remain within normal limits. At time of surgery for appendicitis, visual assessment of the nearby right ovary might be helpful, as deemed clinically appropriate. 5. Multiple large  hypoattenuating nonspecific lesions within the  right hepatic lobe and caudate lobe, measuring 6.0 cm, 4.1 cm, 3.9 cm and 2.5 cm. These are of uncertain significance. Malignancy cannot be entirely excluded. Dynamic liver protocol MRI or CT is recommended for further evaluation, when and as deemed clinically appropriate. Would correlate with LFTs. 6. Small right inguinal hernia, containing only fat. 7. Nonobstructing 2 mm stone at the lower pole of the left kidney. 8. Bibasilar atelectasis noted.  These results were called by telephone at the time of interpretation on 07/30/2014 at 10:49 pm to Dr. Gwyneth Sprout, who verbally acknowledged these results.   Electronically Signed   By: Roanna Raider M.D.   On: 07/30/2014 22:49    Procedures Laparoscopic appendectomy---Dr. Marty Heck Course:  [name] [name] presented to [facility] with abdominal pain and concerns for appendicitis.  On work up she was found to have appendicitis and liver lesions.  She reports abdominal pain and diarrhea every since her trip to [place] in January and has been evaluating this by her primary care doctor.  CT also showed acute diverticulitis at the mid sigmoid colon, Slight asymmetric prominence of the right ovary, Multiple large hypoattenuating nonspecific lesions within the right hepatic lobe and caudate lobe/  Patient was admitted and underwent procedure listed above.  Tolerated procedure well and was transferred to the floor.  Diet was advanced as tolerated.  On POD#1, the patient was voiding well, tolerating diet, ambulating well, pain well controlled, vital signs stable, incisions c/d/i and felt stable for discharge home. She was sent with augmentin x1 week for diverticulitis.  Medication risks, benefits and therapeutic alternatives were reviewed with the patient.  She verbalizes understanding.  She was also asked to follow up with her primary care doctor for further work up of liver and ovary.  Patient will follow up in our  office in 2 weeks and knows to call with questions or concerns.  Physical Exam: General:  Alert, NAD, pleasant, comfortable Abd:  Soft, ND, mild tenderness, incisions C/D/I    Medication List    STOP taking these medications        sulfamethoxazole-trimethoprim 800-160 MG per tablet  Commonly known as:  BACTRIM DS,SEPTRA DS      TAKE these medications        amLODipine 2.5 MG tablet  Commonly known as:  NORVASC  TAKE 1 TABLET BY MOUTH DAILY     amoxicillin-clavulanate 875-125 MG per tablet  Commonly known as:  AUGMENTIN  Take 1 tablet by mouth 2 (two) times daily.     fluticasone 50 MCG/ACT nasal spray  Commonly known as:  FLONASE  Place 1 spray into both nostrils daily as needed for allergies or rhinitis (allergies).     hydrochlorothiazide 25 MG tablet  Commonly known as:  HYDRODIURIL  TAKE 1 TABLET BY MOUTH DAILY     HYDROcodone-acetaminophen 5-325 MG per tablet  Commonly known as:  NORCO/VICODIN  Take 1-2 tablets by mouth every 6 (six) hours as needed for moderate pain.     losartan-hydrochlorothiazide 100-25 MG per tablet  Commonly known as:  HYZAAR  Take 1 tablet by mouth daily.     naproxen sodium 220 MG tablet  Commonly known as:  ANAPROX  Take 220 mg by mouth daily as needed (headache and pain).     ondansetron 4 MG tablet  Commonly known as:  ZOFRAN  Take 1 tablet (4 mg total) by mouth every 8 (eight) hours as needed for nausea or vomiting.     venlafaxine XR 150  MG 24 hr capsule  Commonly known as:  EFFEXOR-XR  TAKE ONE CAPSULE BY MOUTH DAILY             Follow-up Information    [facility]   Specialty:  General Surgery   Why:  2-3 weeks for a post operative check up   Contact information:   [address] [place] [address] [phone]       Signed: Ashok Norrismina Latiffany Harwick, [facility] CenterNP-BC [facility] [phone]  07/31/2014, 10:38 AM

## 2014-07-31 NOTE — Op Note (Signed)
Re:   [name] [name] DOB:   01-11-1961 MRN:   [id number]                   FACILITY:  [facility]  DATE OF PROCEDURE: 07/31/2014                              OPERATIVE REPORT  PREOPERATIVE DIAGNOSIS:  Appendicitis  POSTOPERATIVE DIAGNOSIS:  Acute appendicitis.  Normal ovaries.  Thickened sigmoid colon.  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  (photos taken)  PROCEDURE:  Laparoscopic appendectomy.  SURGEON:  Sandria Bales. Ezzard Standing, MD  ASSISTANT:  No first assistant.  ANESTHESIA:  General endotracheal.  Anesthesiologist: Marcene Duos, MD CRNA: Paris Lore, CRNA  ASA:  2E  ESTIMATED BLOOD LOSS:  Minimal.  DRAINS: none   SPECIMEN:   Appendix  COUNTS CORRECT:  YES  INDICATIONS FOR PROCEDURE: [name] [name] is a 54 y.o. (DOB: Aug 26, 1960) white  female whose primary care doctor is Margaree Mackintosh, MD and comes to the OR for an appendectomy.   She also had some other findings on her CT scan of her abdomen/pelvis that I would look into.  I discussed with the patient, the indications and potential complications of appendiceal surgery.  The potential complications include, but are not limited to, bleeding, open surgery, bowel resection, and the possibility of another diagnosis.  OPERATIVE NOTE:  The patient underwent a general endotracheal anesthetic as supervised by Anesthesiologist: Marcene Duos, MD CRNA: Paris Lore, CRNA, General, in room #1.  The patient was given Zosyn at the beginning of the procedure and the abdomen was prepped with ChloraPrep.  The patient had a foley catheter placed at the beginning of the procedure.  A time-out was held and surgical checklist run.  I accessed her abdominal cavity with a 5 mm optiview trocar in the RUQ.    An 12 mm trocar was inserted through her supraumbilical area of her scar.   A A 5 mm torcar was placed in left lower quadrant and I did abdominal exploration.    The right and left lobes  of her liver were unremarkable.  There was a cleft scar in the lower left lateral lobe of the liver that looked benign.  Her gall bladder was unremarkable.  I saw her right and left ovary.  The right ovary was a little rounder, but both looked normal.  I saw the vaginal cuff.  She had some scar to this that I took down with the harmonic scapel.  The sigmoid colon had diverticulosis and was thickened, but I could not distinguish an area that looked obviously inflamed.  I saw no other intra-abdominal abnormality.  The patient had appendicitis with the appendix located along the right lateral pelvic brim.  It was inflamed with minimal purulence.  The appendix was freed from the sidewall of the pelvic brim.  The mesentery of the appendix was divided with a Harmonic scalpel.  I got to the base of the appendix.  I then used a blue load 45 mm Ethicon Endo-GIA stapler and fired this across the base of the appendix.  I placed the appendix in EndoCatch bag and delivered the bag through the umbilical incision.  I irrigated the abdomen with 1,800 cc of saline.  She had one bleeding area in the middle of the staple line.  I placed a single 2-0 Vicryl suture in this area to stop  the bleeding.  After irrigating the abdomen, I then removed the trocars, in turn.  I used an Endoclose with 0 Vicryl suture to close the 12 mm port at the supraumbilical incision.   I closed the skin each site with a 5-0 Monocryl suture and painted the wounds with Dermabond.  I then injected a total of 30 mL of 0.25% Marcaine at the incisions.  Sponge and needle count were correct at the end of the case.  The foley catheter was removed in the OR.  The patient was transferred to the recovery room in good condition.  The patient tolerated the procedure well and it depends on the patient's post op clinical course as to when the patient could be discharged.  She'll need to be on antibiotics at least 7 days for her diverticulitis.   Ovidio Kinavid  Ermine Stebbins, MD, [facility] Pager: [phone] Office phone:  [phone]

## 2014-07-31 NOTE — Transfer of Care (Signed)
Immediate Anesthesia Transfer of Care Note  Patient: [name] EhrlichJulie Paradise [name]  Procedure(s) Performed: Procedure(s) (LRB): APPENDECTOMY LAPAROSCOPIC (N/A)  Patient Location: PACU  Anesthesia Type: General  Level of Consciousness: sedated, patient cooperative and responds to stimulation  Airway & Oxygen Therapy: Patient Spontanous Breathing and Patient connected to face mask oxgen  Post-op Assessment: Report given to PACU RN and Post -op Vital signs reviewed and stable  Post vital signs: Reviewed and stable  Complications: No apparent anesthesia complications

## 2014-07-31 NOTE — Anesthesia Procedure Notes (Signed)
Procedure Name: Intubation Date/Time: 07/31/2014 12:56 AM Performed by: Paris LoreBLANTON, Amberleigh Gerken M Pre-anesthesia Checklist: Patient identified, Emergency Drugs available, Suction available, Patient being monitored and Timeout performed Patient Re-evaluated:Patient Re-evaluated prior to inductionOxygen Delivery Method: Circle system utilized Preoxygenation: Pre-oxygenation with 100% oxygen Intubation Type: IV induction Ventilation: Mask ventilation without difficulty Laryngoscope Size: Mac and 4 Grade View: Grade I Tube type: Oral Tube size: 7.5 mm Number of attempts: 1 Airway Equipment and Method: Stylet Placement Confirmation: ETT inserted through vocal cords under direct vision,  positive ETCO2,  CO2 detector and breath sounds checked- equal and bilateral Secured at: 20 cm Tube secured with: Tape Dental Injury: Teeth and Oropharynx as per pre-operative assessment

## 2014-07-31 NOTE — Progress Notes (Signed)
UR completed 

## 2014-07-31 NOTE — Progress Notes (Signed)
General Surgery Note   POD -  Day of Surgery  Assessment/Plan: 1.  APPENDECTOMY LAPAROSCOPIC - 07/30/2014 -  D. Lynard Postlewait  On Zosyn - for appendix and diverticulitis  Doing well - will plan to go home later today.  I spoke to Amina about seeing her and discharging her.  2. Diverticulitis - Sigmoid colon Will need prolonged antibiotics post op to treat this 3. HTN 4. Hypoattenuating lesions of the right lobe and caudate lobe of liver She has a mildly elevated Alk Phos I gave her husband copies of the CT scan. 5. 2 mm stone of lower pole of left kidney 6. Remote history of seeing Dr. Waunita Schooner for MVP On no meds and no recent problems 7. Hypercholesterolemia 8.  DVT prophylaxis - SQ Heparin   Active Problems:   Appendicitis   Subjective:  Doing well.  No complaints. Objective:   Filed Vitals:   07/31/14 0631  BP: 115/76  Pulse: 80  Temp: 97.7 F (36.5 C)  Resp: 18     Intake/Output from previous day:  03/30 0701 - 03/31 0700 In: 1790 [I.V.:1740; IV Piggyback:50] Out: 1000 [Urine:1000]  Intake/Output this shift:      Physical Exam:   General: WN WF who is alert and oriented.    HEENT: Normal. Pupils equal. .   Lungs: Clear   Abdomen: Soft.   Wound: Look okay.   Lab Results:    Recent Labs  07/30/14 2148  WBC 6.9  HGB 13.2  HCT 39.3  PLT 172    BMET   Recent Labs  07/30/14 2110  NA 137  K 3.6  CL 100  CO2 26  GLUCOSE 105*  BUN 21  CREATININE 0.91  CALCIUM 9.8    PT/INR  No results for input(s): LABPROT, INR in the last 72 hours.  ABG  No results for input(s): PHART, HCO3 in the last 72 hours.  Invalid input(s): PCO2, PO2   Studies/Results:  Ct Abdomen Pelvis W Contrast  07/30/2014   CLINICAL DATA:  Acute onset right lower quadrant abdominal pain for 2 days. Initial encounter.  EXAM: CT ABDOMEN AND PELVIS WITH CONTRAST  TECHNIQUE: Multidetector CT imaging of the abdomen and pelvis was  performed using the standard protocol following bolus administration of intravenous contrast.  CONTRAST:  185mL OMNIPAQUE IOHEXOL 300 MG/ML  SOLN  COMPARISON:  Right upper quadrant ultrasound performed 06/12/2014  FINDINGS: Bibasilar atelectasis is noted. Bilateral breast implants are noted.  There are vague nonspecific hypoattenuating lesions within the right hepatic lobe and caudate lobe, measuring 6.0 cm, 4.1 cm, 3.9 cm and 2.5 cm in size. These are of uncertain significance. Dynamic liver protocol MRI or CT would be helpful for further evaluation, and would correlate with LFTs.  The gallbladder is within normal limits. The pancreas and adrenal glands are unremarkable. Mild calcification is noted along the splenic artery at the body of the pancreas.  A tiny 2 mm stone is noted at the lower pole of the left kidney. The kidneys are otherwise unremarkable in appearance. There is no evidence of hydronephrosis. No obstructing ureteral stones are seen. No perinephric stranding is appreciated.  No free fluid is identified. The small bowel is unremarkable in appearance. The stomach is within normal limits. No acute vascular abnormalities are seen.  The appendix is dilated to 1.1 cm in maximal diameter, with surrounding soft tissue inflammation and associated wall thickening. There is no evidence of perforation or abscess formation. This is compatible with acute appendicitis.  Scattered diverticulosis  is noted along the ascending, distal transverse, descending and sigmoid colon. There is focal soft tissue inflammation about the mid sigmoid colon, with inflamed diverticula. Inflammation extends about the adjacent left ovary. Findings are compatible with acute diverticulitis.  The bladder is moderately distended and grossly unremarkable in appearance. The patient is status post hysterectomy. A right adnexal follicle is noted. There is slight asymmetric prominence of the right ovary, though this is thought to remain within  normal limits. Soft tissue inflammation is noted about the left ovary, as described above. No inguinal lymphadenopathy is seen.  A small right inguinal hernia is seen, containing only fat.  No acute osseous abnormalities are identified.  IMPRESSION: 1. Acute appendicitis, with dilatation of the appendix to 1.1 cm in maximal diameter, surrounding soft tissue inflammation and wall thickening. No evidence of perforation or abscess formation. 2. Acute diverticulitis at the mid sigmoid colon, with focal soft tissue inflammation about the mid sigmoid colon and right diverticula. Inflammation extends about the adjacent left ovary. 3. Scattered diverticulosis along the ascending, distal transverse, descending and sigmoid colon. 4. Slight asymmetric prominence of the right ovary, though this is thought to remain within normal limits. At time of surgery for appendicitis, visual assessment of the nearby right ovary might be helpful, as deemed clinically appropriate. 5. Multiple large hypoattenuating nonspecific lesions within the right hepatic lobe and caudate lobe, measuring 6.0 cm, 4.1 cm, 3.9 cm and 2.5 cm. These are of uncertain significance. Malignancy cannot be entirely excluded. Dynamic liver protocol MRI or CT is recommended for further evaluation, when and as deemed clinically appropriate. Would correlate with LFTs. 6. Small right inguinal hernia, containing only fat. 7. Nonobstructing 2 mm stone at the lower pole of the left kidney. 8. Bibasilar atelectasis noted.  These results were called by telephone at the time of interpretation on 07/30/2014 at 10:49 pm to Dr. Blanchie Dessert, who verbally acknowledged these results.   Electronically Signed   By: Garald Balding M.D.   On: 07/30/2014 22:49     Anti-infectives:   Anti-infectives    Start     Dose/Rate Route Frequency Ordered Stop   07/31/14 0600  piperacillin-tazobactam (ZOSYN) IVPB 3.375 g     3.375 g 12.5 mL/hr over 240 Minutes Intravenous 3 times per  day 07/31/14 0343     07/31/14 0000  piperacillin-tazobactam (ZOSYN) IVPB 3.375 g     3.375 g 100 mL/hr over 30 Minutes Intravenous  Once 07/30/14 2357 07/31/14 0102      Alphonsa Overall, MD, FACS Pager: [facility] Office: [phone] 07/31/2014

## 2014-07-31 NOTE — Anesthesia Preprocedure Evaluation (Signed)
Anesthesia Evaluation  Patient identified by MRN, date of birth, ID band Patient awake    Reviewed: Allergy & Precautions, NPO status , Patient's Chart, lab work & pertinent test results  Airway Mallampati: I  TM Distance: >3 FB Neck ROM: Full    Dental  (+) Teeth Intact, Dental Advisory Given   Pulmonary neg pulmonary ROS,  breath sounds clear to auscultation        Cardiovascular hypertension, Pt. on medications Rhythm:Regular Rate:Normal     Neuro/Psych  Headaches,    GI/Hepatic Neg liver ROS, GERD-  ,Acute appendicitis   Endo/Other  negative endocrine ROS  Renal/GU negative Renal ROS     Musculoskeletal negative musculoskeletal ROS (+)   Abdominal   Peds  Hematology negative hematology ROS (+)   Anesthesia Other Findings   Reproductive/Obstetrics                             Anesthesia Physical Anesthesia Plan  ASA: II and emergent  Anesthesia Plan: General   Post-op Pain Management:    Induction: Intravenous  Airway Management Planned: Oral ETT  Additional Equipment:   Intra-op Plan:   Post-operative Plan: Extubation in OR  Informed Consent: I have reviewed the patients History and Physical, chart, labs and discussed the procedure including the risks, benefits and alternatives for the proposed anesthesia with the patient or authorized representative who has indicated his/her understanding and acceptance.   Dental advisory given  Plan Discussed with: CRNA  Anesthesia Plan Comments:         Anesthesia Quick Evaluation

## 2014-08-01 ENCOUNTER — Encounter (HOSPITAL_COMMUNITY): Payer: Self-pay | Admitting: Surgery

## 2014-08-04 NOTE — Anesthesia Postprocedure Evaluation (Signed)
  Anesthesia Post-op Note  Patient: [name] EhrlichJulie Paradise [name]  Procedure(s) Performed: Procedure(s): APPENDECTOMY LAPAROSCOPIC (N/A)  Patient Location: PACU  Anesthesia Type:General  Level of Consciousness: awake and alert   Airway and Oxygen Therapy: Patient Spontanous Breathing  Post-op Pain: mild  Post-op Assessment: Post-op Vital signs reviewed  Post-op Vital Signs: Reviewed  Last Vitals:  Filed Vitals:   07/31/14 1000  BP: 97/64  Pulse:   Temp: 36.8 C  Resp: 18    Complications: No apparent anesthesia complications

## 2014-08-11 ENCOUNTER — Telehealth: Payer: Self-pay | Admitting: Internal Medicine

## 2014-08-11 NOTE — Telephone Encounter (Signed)
Husband wants MRI of liver. See if you can get approval.

## 2014-08-11 NOTE — Telephone Encounter (Signed)
Patient states that you we were to order her an MRI of the liver?    Please advise.

## 2014-08-13 ENCOUNTER — Telehealth: Payer: Self-pay | Admitting: *Deleted

## 2014-08-13 ENCOUNTER — Encounter: Payer: Self-pay | Admitting: *Deleted

## 2014-08-13 DIAGNOSIS — R932 Abnormal findings on diagnostic imaging of liver and biliary tract: Secondary | ICD-10-CM

## 2014-08-20 NOTE — Telephone Encounter (Signed)
Patient has scheduled to have MRI of liver

## 2014-08-27 ENCOUNTER — Ambulatory Visit
Admission: RE | Admit: 2014-08-27 | Discharge: 2014-08-27 | Disposition: A | Discharge status: Home / Self Care | Payer: PRIVATE HEALTH INSURANCE | Source: Ambulatory Visit | Attending: Internal Medicine | Admitting: Internal Medicine

## 2014-08-27 DIAGNOSIS — R932 Abnormal findings on diagnostic imaging of liver and biliary tract: Secondary | ICD-10-CM

## 2014-08-27 MED ORDER — GADOBENATE DIMEGLUMINE 529 MG/ML IV SOLN
15.0000 mL | Freq: Once | INTRAVENOUS | Status: AC | PRN
  Administered 2014-08-27: 15 mL via INTRAVENOUS

## 2014-08-28 ENCOUNTER — Telehealth: Payer: Self-pay | Admitting: *Deleted

## 2014-08-28 NOTE — Telephone Encounter (Signed)
Spoke with patient she will come in on May 9,2016 at 4pm

## 2014-09-08 ENCOUNTER — Encounter: Payer: Self-pay | Admitting: Internal Medicine

## 2014-09-08 ENCOUNTER — Ambulatory Visit (INDEPENDENT_AMBULATORY_CARE_PROVIDER_SITE_OTHER): Payer: PRIVATE HEALTH INSURANCE | Admitting: Internal Medicine

## 2014-09-08 VITALS — BP 136/80 | HR 82 | Temp 98.1°F

## 2014-09-08 DIAGNOSIS — I10 Essential (primary) hypertension: Secondary | ICD-10-CM | POA: Diagnosis not present

## 2014-09-08 DIAGNOSIS — K7689 Other specified diseases of liver: Secondary | ICD-10-CM | POA: Diagnosis not present

## 2014-09-08 DIAGNOSIS — K769 Liver disease, unspecified: Secondary | ICD-10-CM

## 2014-09-08 DIAGNOSIS — Z9889 Other specified postprocedural states: Secondary | ICD-10-CM

## 2014-09-08 DIAGNOSIS — Z130 Encounter for screening for diseases of the blood and blood-forming organs and certain disorders involving the immune mechanism: Secondary | ICD-10-CM

## 2014-09-08 DIAGNOSIS — Z8719 Personal history of other diseases of the digestive system: Secondary | ICD-10-CM

## 2014-09-08 DIAGNOSIS — Z7189 Other specified counseling: Secondary | ICD-10-CM | POA: Diagnosis not present

## 2014-09-08 DIAGNOSIS — Z9049 Acquired absence of other specified parts of digestive tract: Secondary | ICD-10-CM

## 2014-09-08 DIAGNOSIS — Z7184 Encounter for health counseling related to travel: Secondary | ICD-10-CM

## 2014-09-09 LAB — FERRITIN: FERRITIN: 113 ng/mL (ref 10–291)

## 2014-09-09 LAB — CBC WITH DIFFERENTIAL/PLATELET
Basophils Absolute: 0.1 10*3/uL (ref 0.0–0.1)
Basophils Relative: 1 % (ref 0–1)
Eosinophils Absolute: 0.1 10*3/uL (ref 0.0–0.7)
Eosinophils Relative: 1 % (ref 0–5)
HCT: 36.6 % (ref 36.0–46.0)
Hemoglobin: 12.6 g/dL (ref 12.0–15.0)
LYMPHS ABS: 1.3 10*3/uL (ref 0.7–4.0)
Lymphocytes Relative: 24 % (ref 12–46)
MCH: 30.5 pg (ref 26.0–34.0)
MCHC: 34.4 g/dL (ref 30.0–36.0)
MCV: 88.6 fL (ref 78.0–100.0)
MONOS PCT: 7 % (ref 3–12)
MPV: 10 fL (ref 8.6–12.4)
Monocytes Absolute: 0.4 10*3/uL (ref 0.1–1.0)
NEUTROS ABS: 3.7 10*3/uL (ref 1.7–7.7)
Neutrophils Relative %: 67 % (ref 43–77)
Platelets: 183 10*3/uL (ref 150–400)
RBC: 4.13 MIL/uL (ref 3.87–5.11)
RDW: 14.1 % (ref 11.5–15.5)
WBC: 5.5 10*3/uL (ref 4.0–10.5)

## 2014-09-09 LAB — HEPATITIS A ANTIBODY, TOTAL: Hep A Total Ab: NONREACTIVE

## 2014-09-09 LAB — HEPATIC FUNCTION PANEL
ALK PHOS: 97 U/L (ref 39–117)
ALT: 21 U/L (ref 0–35)
AST: 24 U/L (ref 0–37)
Albumin: 4.2 g/dL (ref 3.5–5.2)
BILIRUBIN DIRECT: 0.1 mg/dL (ref 0.0–0.3)
BILIRUBIN TOTAL: 0.4 mg/dL (ref 0.2–1.2)
Indirect Bilirubin: 0.3 mg/dL (ref 0.2–1.2)
Total Protein: 6.8 g/dL (ref 6.0–8.3)

## 2014-09-09 LAB — IRON AND TIBC
%SAT: 24 % (ref 20–55)
Iron: 78 ug/dL (ref 42–145)
TIBC: 324 ug/dL (ref 250–470)
UIBC: 246 ug/dL (ref 125–400)

## 2014-09-09 LAB — HEPATITIS C ANTIBODY: HCV AB: NEGATIVE

## 2014-09-09 LAB — HEPATITIS B SURFACE ANTIBODY, QUANTITATIVE: Hepatitis B-Post: 0.1 m[IU]/mL

## 2014-09-09 MED ORDER — ONDANSETRON HCL 4 MG PO TABS: 4.0000 mg | ORAL_TABLET | Freq: Three times a day (TID) | ORAL | Status: DC | PRN

## 2014-09-09 MED ORDER — CIPROFLOXACIN HCL 500 MG PO TABS: 500.0000 mg | ORAL_TABLET | Freq: Two times a day (BID) | ORAL | Status: DC

## 2014-09-09 MED ORDER — METRONIDAZOLE 500 MG PO TABS: 500.0000 mg | ORAL_TABLET | Freq: Two times a day (BID) | ORAL | Status: DC

## 2014-09-09 NOTE — Patient Instructions (Signed)
[place] as well as Zofran. Will order MRI of the liver with special contrast material.

## 2014-09-29 ENCOUNTER — Encounter: Payer: Self-pay | Admitting: Internal Medicine

## 2014-09-29 NOTE — Progress Notes (Signed)
   Subjective:    Patient ID: [name] [name], female    DOB: 01/30/1961, 54 y.o.   MRN: [id number]  HPI  She is here regarding abnormal MRI of the liver. There are nonspecific liver lesions that cannot clearly be identified is benign. Dr. Ezzard StandingNewman felt they were likely benign when he did her appendectomy recently. He was able to review them and take a picture problem. However Dr. Chestine Sporelark, her husband, wanted to pursue further evaluation with MRI. She has a history of sarcoidosis so there is a possibility these could represent sarcoid lesions. Spoke with Dr. Chestine Sporelark on the phone today who has consulted with several his colleagues. They're now recommending an MRI with specific contrast that is taken up by normal hepatocytes in order to try to see if these lesions are indeed normal liver tissue.  Otherwise patient is feeling well status post appendectomy and diverticulitis.  She is looking forward to a trip to [place] in the near future. With her recent history of diverticulitis, will prescribe Cipro and Flagyl in addition to Zofran in case she develops another bout of diverticulitis  Says blood pressure has been running normal blood pressure is within normal limits today    Review of Systems     Objective:   Physical Exam  Not examined. Spent 25 minutes with patient regarding these issues described above. Need to get MRI certified by insurance. Will need to provide specific documentation for that.      Assessment & Plan:  Essential hypertension-stable on current medications  Travel advice encounter-have prescribed Cipro, Flagyl and Zofran in the event of recurrent diverticulitis  Liver lesions-not easily identifiable. Need further testing with MRI with specific contrast material  Status post recent appendectomy  Status post recent bout of diverticulitis at the same time as appendectomy  Plan: See above

## 2014-12-04 ENCOUNTER — Other Ambulatory Visit: Payer: Self-pay | Admitting: Internal Medicine

## 2015-02-18 ENCOUNTER — Telehealth: Payer: Self-pay | Admitting: Internal Medicine

## 2015-02-18 NOTE — Telephone Encounter (Signed)
Would like to have a Rx for Xanax 0.5 mg.  She hasn't had one for years.  States she doesn't use it a lot.  However, feels she may it now for prn basis.    [facility]:  [facility] [address] [facility] [address].

## 2015-02-19 NOTE — Telephone Encounter (Signed)
Call in 30 tabs Xanax 0.25 mg one po bid prn anxiety

## 2015-02-20 MED ORDER — ALPRAZOLAM 0.25 MG PO TABS: 0.2500 mg | ORAL_TABLET | Freq: Two times a day (BID) | ORAL | Status: DC | PRN

## 2015-02-20 NOTE — Telephone Encounter (Signed)
[facility].  Patient aware.

## 2015-03-12 ENCOUNTER — Other Ambulatory Visit: Payer: Self-pay | Admitting: Internal Medicine

## 2015-04-07 ENCOUNTER — Telehealth: Payer: Self-pay | Admitting: Internal Medicine

## 2015-04-07 DIAGNOSIS — R935 Abnormal findings on diagnostic imaging of other abdominal regions, including retroperitoneum: Secondary | ICD-10-CM

## 2015-04-07 NOTE — Telephone Encounter (Signed)
Calling regarding an MRI of her LIVER.  States that due to her sister dying of Liver Cancer, she feels she wants to pursue having this MRI Liver done.  Her husband indicated there is and MRI with Contrast called EOVISC.  She would like to opt to have this done.  She couldn't remember if you had already contacted her insurance by written letter or not.  So, she's inquiring because she wants to have this done prior to the end of the year for deductible purposes.    Please advise and thanks.

## 2015-04-07 NOTE — Telephone Encounter (Signed)
Please have Deanna check into this was addressed before see documentation

## 2015-04-08 ENCOUNTER — Other Ambulatory Visit: Payer: Self-pay

## 2015-04-08 NOTE — Telephone Encounter (Signed)
MRI liver CPT [id number] approved DOS range 04/02/15-07/01/15. Referral # [id number]

## 2015-04-23 ENCOUNTER — Ambulatory Visit
Admission: RE | Admit: 2015-04-23 | Discharge: 2015-04-23 | Disposition: A | Discharge status: Home / Self Care | Payer: PRIVATE HEALTH INSURANCE | Source: Ambulatory Visit | Attending: Internal Medicine | Admitting: Internal Medicine

## 2015-04-23 DIAGNOSIS — R935 Abnormal findings on diagnostic imaging of other abdominal regions, including retroperitoneum: Secondary | ICD-10-CM

## 2015-04-23 MED ORDER — GADOXETATE DISODIUM 0.25 MMOL/ML IV SOLN
7.0000 mL | Freq: Once | INTRAVENOUS | Status: AC | PRN
  Administered 2015-04-23: 7 mL via INTRAVENOUS

## 2015-04-29 ENCOUNTER — Other Ambulatory Visit: Payer: Self-pay | Admitting: Internal Medicine

## 2015-06-18 ENCOUNTER — Other Ambulatory Visit: Payer: Self-pay | Admitting: Internal Medicine

## 2015-06-18 NOTE — Telephone Encounter (Signed)
Needs OV soon 

## 2015-08-02 ENCOUNTER — Other Ambulatory Visit: Payer: Self-pay | Admitting: Internal Medicine

## 2015-08-03 NOTE — Telephone Encounter (Signed)
Needs PE- no Pap. Refill once

## 2015-09-16 ENCOUNTER — Other Ambulatory Visit: Payer: Self-pay | Admitting: Internal Medicine

## 2015-10-09 ENCOUNTER — Other Ambulatory Visit: Payer: Self-pay | Admitting: Internal Medicine

## 2015-10-09 NOTE — Telephone Encounter (Signed)
No answer, left voicemail advising patient to contact office for appt.

## 2015-10-09 NOTE — Telephone Encounter (Signed)
Not seen since July 2016. Please call her.

## 2015-11-12 ENCOUNTER — Other Ambulatory Visit: Payer: Self-pay | Admitting: Internal Medicine

## 2015-12-03 ENCOUNTER — Other Ambulatory Visit: Payer: PRIVATE HEALTH INSURANCE | Admitting: Internal Medicine

## 2015-12-03 ENCOUNTER — Encounter: Payer: PRIVATE HEALTH INSURANCE | Admitting: Internal Medicine

## 2015-12-04 ENCOUNTER — Other Ambulatory Visit: Payer: PRIVATE HEALTH INSURANCE | Admitting: Internal Medicine

## 2015-12-22 ENCOUNTER — Telehealth: Payer: Self-pay | Admitting: Internal Medicine

## 2015-12-22 DIAGNOSIS — R1011 Right upper quadrant pain: Secondary | ICD-10-CM

## 2015-12-22 NOTE — Addendum Note (Signed)
Addended by: Doree BarthelLOWE, Luara Faye on: 12/22/2015 05:06 PM   Modules accepted: Orders

## 2015-12-22 NOTE — Telephone Encounter (Signed)
Patient called at 5:45 PM saying she had onset late yesterday afternoon of right upper quadrant abdominal pain bloating and nausea. She had a bad night but after lying down for P time symptoms 1 away. She drove home this morning and has felt somewhat better. His been eating lightly. She is concerned it may be her gallbladder. Husband who is a radiologist suggested ultrasound of the gallbladder. She had CT scan March 2016 around the time she had an acute appendicitis and gallbladder appeared normal at that time. Patient says she's had symptoms for 6 weeks with abdominal bloating and burping right upper quadrant pain and sometimes diarrhea.  Dr. Ewing SchleinMagod  has seen her previously for colonoscopy.  Patient says blood pressures been running high recently.  We will order ultrasound of the gallbladder for tomorrow. She'll need to have some labs drawn as well.

## 2015-12-23 ENCOUNTER — Other Ambulatory Visit: Payer: PRIVATE HEALTH INSURANCE

## 2015-12-23 ENCOUNTER — Telehealth: Payer: Self-pay | Admitting: Internal Medicine

## 2015-12-23 NOTE — Telephone Encounter (Signed)
Patient called 12/22/15 @ 4:45 p.m. With c/o abdominal pain, bloating, RUQ pain.  States she is having bowel movements up to 8 times per day.  Indigestion with burping.  Nausea without vomiting.  Was at her [facility] and had to return to [place] today because she felt so bad.  States that she also has a headache with these symptoms.  And, if she could've, she would've driven herself to the hospital on Monday evening while in the [place], but was alone and could not do so.    Spoke with Dr. Renold Genta and she called patient.  Dr. Renold Genta would like to have patient have U/S of Abdomen on Wednesday.  Patient is to also pick up lab order from the office and take it to [place] and have labs drawn.  Spoke with patient and advised of this information.  Patient instructed to remain NPO after midnight Tuesday night for Ultrasound on Wednesday, 8/23.    Called [employer] @ [phone]; spoke with Nilda Simmer.; CPT [phone] (U/S of Abdomen (limited RUQ)) does not require authorization.  Spoke with Japrece in benefits with [employer] at same number; policy effective 09/04/18 and active.  Will pay 100% after deductible has been met.  $4000 CYD.  $180.97 of deductible has been met.  No PAC req!    Spoke with [facility] @ [place] @ [phone]; they had a cancellation this morning for [place]; they will contact the patient NOW and have her come in this morning for [place] @ 10:15 this morning.   Advised that we had instructed patient to be NPO after midnight, so patient is prepped.

## 2015-12-24 ENCOUNTER — Telehealth: Payer: Self-pay | Admitting: Neurology

## 2015-12-24 NOTE — Telephone Encounter (Signed)
Spoke to pt. Work-in appt scheduled for next Mon. Pt agreed to arrive 15 min prior to 12:00 appt.

## 2015-12-24 NOTE — Telephone Encounter (Signed)
I called patient. The patient is having headaches that began over the last 2 or 3 weeks. She indicates that the headaches are throughout the head, but are focused on the right eye. The headaches are so sheeted with photophobia, phonophobia, nausea, slight confusion. The patient apparently does have a history of sarcoidosis affecting the lymph nodes. She indicates that the headaches may last up to 5 hours, she has had 3 headaches over the last 2 weeks.  The headaches likely involve migraine, she does have a pre-existing history of migraine but these headaches are different from her usual. The patient will have a work in visit, we may consider MRI of the brain, starting a daily prophylactic medication.

## 2015-12-25 ENCOUNTER — Ambulatory Visit
Admission: RE | Admit: 2015-12-25 | Discharge: 2015-12-25 | Disposition: A | Discharge status: Home / Self Care | Payer: PRIVATE HEALTH INSURANCE | Source: Ambulatory Visit | Attending: Gastroenterology | Admitting: Gastroenterology

## 2015-12-25 ENCOUNTER — Other Ambulatory Visit: Payer: Self-pay | Admitting: Gastroenterology

## 2015-12-25 DIAGNOSIS — R1032 Left lower quadrant pain: Secondary | ICD-10-CM

## 2015-12-25 MED ORDER — IOPAMIDOL (ISOVUE-300) INJECTION 61%
100.0000 mL | Freq: Once | INTRAVENOUS | Status: AC | PRN
  Administered 2015-12-25: 100 mL via INTRAVENOUS

## 2015-12-28 ENCOUNTER — Encounter: Payer: Self-pay | Admitting: Neurology

## 2015-12-28 ENCOUNTER — Telehealth: Payer: Self-pay | Admitting: Internal Medicine

## 2015-12-28 ENCOUNTER — Ambulatory Visit (INDEPENDENT_AMBULATORY_CARE_PROVIDER_SITE_OTHER): Payer: PRIVATE HEALTH INSURANCE | Admitting: Neurology

## 2015-12-28 VITALS — BP 154/108 | HR 76 | Ht 65.0 in | Wt 174.0 lb

## 2015-12-28 DIAGNOSIS — G43111 Migraine with aura, intractable, with status migrainosus: Secondary | ICD-10-CM

## 2015-12-28 MED ORDER — RIZATRIPTAN BENZOATE 10 MG PO TBDP: 10.0000 mg | ORAL_TABLET | Freq: Three times a day (TID) | ORAL | 3 refills | Status: DC | PRN

## 2015-12-28 MED ORDER — TOPIRAMATE 25 MG PO TABS: ORAL_TABLET | ORAL | 3 refills | Status: DC

## 2015-12-28 MED ORDER — PREDNISONE 5 MG PO TABS: ORAL_TABLET | ORAL | 0 refills | Status: DC

## 2015-12-28 NOTE — Progress Notes (Signed)
Reason for visit: Migraine headache  [name] [name] is an 55 y.o. female  History of present illness:  [name] [name] is a 55 year old right-handed white female with a history of classic migraine headaches. Her usual headaches occur once or twice a week and are associated with blurring out of the visual fields, onset of a headache that is generally fairly mild. The patient usually does not take any medication at all for her migraine. Within the last 3 weeks, her headaches have changed somewhat. Her headaches are now mainly behind the right eye, sometimes spreading to behind the left eye. She may have some discomfort in the top and back of the head, and she may have some neck stiffness. The patient will have photophobia and phonophobia, sensitivity to odors. She will have some nausea, she has not had any vomiting. The patient denies any numbness or weakness of the extremities. She does feel cloudy headed with the headache and has difficulty with concentration. The headaches may go on for several days. She has had 4 headaches over the last 3 weeks. She comes to this office for an evaluation. When the headache is severe, it is disabling, she is not able to function.  Past Medical History:  Diagnosis Date  . Classic migraine 09/18/2013  . Diplopia 09/18/2013  . Diverticulitis   . Generalized headaches   . GERD (gastroesophageal reflux disease)   . Hyperlipidemia   . Hypertension     Past Surgical History:  Procedure Laterality Date  . ABDOMINAL HYSTERECTOMY    . APPENDECTOMY    . CESAREAN SECTION     x 2  . LAPAROSCOPIC APPENDECTOMY N/A 07/31/2014   Procedure: APPENDECTOMY LAPAROSCOPIC;  Surgeon: Ovidio Kin, MD;  Location: [facility];  Service: General;  Laterality: N/A;  . tendon surgery on elbow    . TONSILLECTOMY      Family History  Problem Relation Age of Onset  . Prostate cancer Father   . Heart disease Father   . Hypertension Father   . Hyperlipidemia Sister   . Hypertension  Sister   . Renal cancer Sister   . Alcohol abuse Brother   . Diabetes Brother   . Hypertension Brother     Social history:  reports that she has never smoked. She has never used smokeless tobacco. She reports that she drinks about 8.4 oz of alcohol per week . She reports that she does not use drugs.    Allergies  Allergen Reactions  . Ramipril Cough  . Sulfa Antibiotics Other (See Comments)    Itching.    Medications:  Prior to Admission medications   Medication Sig Start Date End Date Taking? Authorizing Provider  ALPRAZolam (XANAX) 0.25 MG tablet Take 1 tablet (0.25 mg total) by mouth 2 (two) times daily as needed for anxiety. 02/20/15  Yes Margaree Mackintosh, MD  amoxicillin-clavulanate (AUGMENTIN) 875-125 MG tablet TK 1 T PO Q 12 H FOR 10 DAYS 12/25/15  Yes Historical Provider, MD  fluocinonide cream (LIDEX) 0.05 %  08/06/14  Yes Historical Provider, MD  HYDROcodone-acetaminophen (NORCO/VICODIN) 5-325 MG per tablet Take 1-2 tablets by mouth every 6 (six) hours as needed for moderate pain. 07/31/14  Yes Emina Riebock, NP  losartan-hydrochlorothiazide (HYZAAR) 100-25 MG tablet TK 1 T PO  D 07/23/15  Yes Historical Provider, MD  naproxen sodium (ANAPROX) 220 MG tablet Take 220 mg by mouth daily as needed (headache and pain).   Yes Historical Provider, MD  ondansetron (ZOFRAN) 4 MG tablet Take  1 tablet (4 mg total) by mouth every 8 (eight) hours as needed for nausea or vomiting. 09/09/14  Yes Margaree MackintoshMary J Baxley, MD  venlafaxine XR (EFFEXOR-XR) 150 MG 24 hr capsule TAKE ONE CAPSULE BY MOUTH EVERY DAY. NEEDS APPOINTMENT 11/12/15  Yes Margaree MackintoshMary J Baxley, MD    ROS:  Out of a complete 14 system review of symptoms, the patient complains only of the following symptoms, and all other reviewed systems are negative.  Nausea Headache  Blood pressure (!) 154/108, pulse 76, height 5\' 5"  (1.651 m), weight 174 lb (78.9 kg).  Physical Exam  General: The patient is alert and cooperative at the time of the  examination.  Skin: No significant peripheral edema is noted.   Neurologic Exam  Mental status: The patient is alert and oriented x 3 at the time of the examination. The patient has apparent normal recent and remote memory, with an apparently normal attention span and concentration ability.   Cranial nerves: Facial symmetry is present. Speech is normal, no aphasia or dysarthria is noted. Extraocular movements are full. Visual fields are full.  Motor: The patient has good strength in all 4 extremities.  Sensory examination: Soft touch sensation is symmetric on the face, arms, and legs.  Coordination: The patient has good finger-nose-finger and heel-to-shin bilaterally.  Gait and station: The patient has a normal gait. Tandem gait is normal. Romberg is negative. No drift is seen.  Reflexes: Deep tendon reflexes are symmetric.   Assessment/Plan:  1. Migraine headache  The patient has had conversion of her usual migraine to a headache that is more severe and longer lasting. The patient indicates that bright lights, occasionally odors may bring on the headache. The patient will be placed on a prednisone Dosepak, she will start Topamax working up to 75 mg at night. She will be given Maxalt to take if the headache occurs. She will follow-up in 3 months, sooner if needed. We can make dose adjusted is on medications if needed. The blood pressure was elevated today, but she is complaining of a headache. This will need to be followed.  Marlan Palau. Keith Wong Steadham MD 12/28/2015 12:23 PM  [facility] [address] [place], [place] [id number]  Phone [phone] Fax [phone]

## 2015-12-28 NOTE — Patient Instructions (Addendum)
We will start Topamax at night for the headache and take Maxalt if the headache comes on.   Topamax (topiramate) is a seizure medication that has an FDA approval for seizures and for migraine headache. Potential side effects of this medication include weight loss, cognitive slowing, tingling in the fingers and toes, and carbonated drinks will taste bad. If any significant side effects are noted on this drug, please contact our office.  Migraine Headache A migraine headache is an intense, throbbing pain on one or both sides of your head. A migraine can last for 30 minutes to several hours. CAUSES  The exact cause of a migraine headache is not always known. However, a migraine may be caused when nerves in the brain become irritated and release chemicals that cause inflammation. This causes pain. Certain things may also trigger migraines, such as:  Alcohol.  Smoking.  Stress.  Menstruation.  Aged cheeses.  Foods or drinks that contain nitrates, glutamate, aspartame, or tyramine.  Lack of sleep.  Chocolate.  Caffeine.  Hunger.  Physical exertion.  Fatigue.  Medicines used to treat chest pain (nitroglycerine), birth control pills, estrogen, and some blood pressure medicines. SIGNS AND SYMPTOMS  Pain on one or both sides of your head.  Pulsating or throbbing pain.  Severe pain that prevents daily activities.  Pain that is aggravated by any physical activity.  Nausea, vomiting, or both.  Dizziness.  Pain with exposure to bright lights, loud noises, or activity.  General sensitivity to bright lights, loud noises, or smells. Before you get a migraine, you may get warning signs that a migraine is coming (aura). An aura may include:  Seeing flashing lights.  Seeing bright spots, halos, or zigzag lines.  Having tunnel vision or blurred vision.  Having feelings of numbness or tingling.  Having trouble talking.  Having muscle weakness. DIAGNOSIS  A migraine  headache is often diagnosed based on:  Symptoms.  Physical exam.  A CT scan or MRI of your head. These imaging tests cannot diagnose migraines, but they can help rule out other causes of headaches. TREATMENT Medicines may be given for pain and nausea. Medicines can also be given to help prevent recurrent migraines.  HOME CARE INSTRUCTIONS  Only take over-the-counter or prescription medicines for pain or discomfort as directed by your health care provider. The use of long-term narcotics is not recommended.  Lie down in a dark, quiet room when you have a migraine.  Keep a journal to find out what may trigger your migraine headaches. For example, write down:  What you eat and drink.  How much sleep you get.  Any change to your diet or medicines.  Limit alcohol consumption.  Quit smoking if you smoke.  Get 7-9 hours of sleep, or as recommended by your health care provider.  Limit stress.  Keep lights dim if bright lights bother you and make your migraines worse. SEEK IMMEDIATE MEDICAL CARE IF:   Your migraine becomes severe.  You have a fever.  You have a stiff neck.  You have vision loss.  You have muscular weakness or loss of muscle control.  You start losing your balance or have trouble walking.  You feel faint or pass out.  You have severe symptoms that are different from your first symptoms. MAKE SURE YOU:   Understand these instructions.  Will watch your condition.  Will get help right away if you are not doing well or get worse.   This information is not intended to replace  advice given to you by your health care provider. Make sure you discuss any questions you have with your health care provider.   Document Released: 04/18/2005 Document Revised: 05/09/2014 Document Reviewed: 12/24/2012 Elsevier Interactive Patient Education [employer]

## 2015-12-28 NOTE — Telephone Encounter (Signed)
Wants to know if she should take more of her BP meds to get her BP down.  She took her BP meds this morning about 9:30.  She just took her BP and it was 160/110.  She feels like her BP meds have just stopped working.  She comes in Thursday morning for CPE Labs.  Advised that she would just be having labs drawn that morning, not seeing the dr.    Please advise.

## 2015-12-28 NOTE — Progress Notes (Signed)
Please let her know Topamax can also lower BP. We can add amlodipine if BP stays elevated but headache can also raise BP.

## 2015-12-28 NOTE — Telephone Encounter (Signed)
We need to recheck her B-met as creatinine was elevated. I do not want to decide on anything until I see her.

## 2015-12-29 NOTE — Telephone Encounter (Signed)
Spoke to patient. Gave instructions. Patient verbalized understanding.  

## 2015-12-31 ENCOUNTER — Other Ambulatory Visit: Payer: PRIVATE HEALTH INSURANCE | Admitting: Internal Medicine

## 2015-12-31 ENCOUNTER — Ambulatory Visit (INDEPENDENT_AMBULATORY_CARE_PROVIDER_SITE_OTHER): Payer: PRIVATE HEALTH INSURANCE | Admitting: Internal Medicine

## 2015-12-31 VITALS — BP 154/92 | HR 84

## 2015-12-31 DIAGNOSIS — E782 Mixed hyperlipidemia: Secondary | ICD-10-CM

## 2015-12-31 DIAGNOSIS — R748 Abnormal levels of other serum enzymes: Secondary | ICD-10-CM

## 2015-12-31 DIAGNOSIS — Z8669 Personal history of other diseases of the nervous system and sense organs: Secondary | ICD-10-CM

## 2015-12-31 DIAGNOSIS — K5732 Diverticulitis of large intestine without perforation or abscess without bleeding: Secondary | ICD-10-CM | POA: Diagnosis not present

## 2015-12-31 DIAGNOSIS — R1012 Left upper quadrant pain: Secondary | ICD-10-CM

## 2015-12-31 DIAGNOSIS — I10 Essential (primary) hypertension: Secondary | ICD-10-CM

## 2015-12-31 DIAGNOSIS — R7989 Other specified abnormal findings of blood chemistry: Secondary | ICD-10-CM

## 2015-12-31 LAB — BASIC METABOLIC PANEL
BUN: 20 mg/dL (ref 7–25)
CALCIUM: 9.6 mg/dL (ref 8.6–10.4)
CO2: 28 mmol/L (ref 20–31)
CREATININE: 1.05 mg/dL (ref 0.50–1.05)
Chloride: 100 mmol/L (ref 98–110)
Glucose, Bld: 81 mg/dL (ref 65–99)
Potassium: 3.6 mmol/L (ref 3.5–5.3)
Sodium: 141 mmol/L (ref 135–146)

## 2015-12-31 MED ORDER — AMLODIPINE BESYLATE 5 MG PO TABS: 5.0000 mg | ORAL_TABLET | Freq: Every day | ORAL | 3 refills | Status: DC

## 2015-12-31 NOTE — Progress Notes (Addendum)
   Subjective:    Patient ID: [name] [name], female    DOB: 1961/01/06, 55 y.o.   MRN: [id number]  HPI She is here today for blood pressure check and also to have B- met repeated because of elevated serum creatinine which was done when Dr. Watt Climes last Wednesday, August 23. Patient is called several times same blood pressure remains elevated. She had a very bad migraine headache last week. She saw Dr. Jannifer Franklin who confirmed it was a migraine. She's feeling better. She says Dr. Watt Climes diagnosed her with diverticulitis. She never made it to have a gallbladder ultrasound because she had such a bad migraine last Wednesday, August 23 that she was mixed up and missed the appointment. He does mention in his note possibly doing a hepatobiliary scan in the near future. She wants to do an apple juice flush for her system. I do not think that's a good idea at this point in time. She had changed her diet before having these abdominal symptoms. She was eating more whole grain, no meat and no dairy.  I'm assuming that she had some volume depletion last week when she was so sick and hopefully be met is just elevated because of that. In any case. I think she should add amlodipine 5 mg daily to her blood pressure regimen. She has appointment to be seen here next week for physical exam. Explained to her that we may need to pursue the right upper quadrant abdominal pain with ultrasound and hepatobiliary scan.  I do see she had a CT scan ordered by Dr. Leeroy Bock [id number] showing moderate diffuse colonic diverticulosis and mild acute diverticulitis of the descending colon. There was no free air or abscess. She has been placed on Augmentin by Dr. Watt Climes.  She is adamant that she'll he had right upper quadrant pain last week and not left lower quadrant abdominal pain. There was no discrete liver mass noted on CT scan. There was a nonobstructing left renal stone.  She should eat lightly with clear liquids and soft diet until next week  when I see her.    Review of Systems see above. No nausea and vomiting     Objective:   Physical Exam  Abdomen is soft nondistended without hepatosplenomegaly masses or tenderness today. Chest clear. Cardiac exam regular rate and rhythm. Extremity is without edema      Assessment & Plan:  Essential hypertension-elevated blood pressure  Abnormal pain  Migraine headaches  Elevated serum creatinine  History of hyperlipidemia  Plan: Repeat basic metabolic panel drawn today. Add amlodipine 5 mg daily to losartan/HCTZ. Return next week for follow-up. Discuss at next appointment how to proceed with GI evaluation.

## 2015-12-31 NOTE — Addendum Note (Signed)
Addended by: Doree BarthelLOWE, Sherolyn Trettin on: 12/31/2015 10:35 AM   Modules accepted: Orders

## 2015-12-31 NOTE — Patient Instructions (Addendum)
Repeat basic metabolic panel drawn today. Add amlodipine 5 mg daily. Return next week for physical examination. Clear liquids and soft diet as tolerated. Follow-up next week

## 2016-01-01 ENCOUNTER — Telehealth: Payer: Self-pay

## 2016-01-01 NOTE — Telephone Encounter (Signed)
Called patient to give results. No answer. Will try later.  

## 2016-01-01 NOTE — Telephone Encounter (Signed)
-----   Message from Mary J Baxley, MD sent at 12/31/2015  9:57 PM EDT ----- Continue Losartan Creatinine improved from labs taken at [facility] 

## 2016-01-06 ENCOUNTER — Ambulatory Visit (INDEPENDENT_AMBULATORY_CARE_PROVIDER_SITE_OTHER): Payer: PRIVATE HEALTH INSURANCE | Admitting: Internal Medicine

## 2016-01-06 ENCOUNTER — Encounter: Payer: Self-pay | Admitting: Internal Medicine

## 2016-01-06 VITALS — BP 120/90 | HR 80 | Wt 179.5 lb

## 2016-01-06 DIAGNOSIS — Z0000 Encounter for general adult medical examination without abnormal findings: Secondary | ICD-10-CM

## 2016-01-06 DIAGNOSIS — Z889 Allergy status to unspecified drugs, medicaments and biological substances status: Secondary | ICD-10-CM | POA: Diagnosis not present

## 2016-01-06 DIAGNOSIS — R1011 Right upper quadrant pain: Secondary | ICD-10-CM

## 2016-01-06 DIAGNOSIS — Z789 Other specified health status: Secondary | ICD-10-CM

## 2016-01-06 DIAGNOSIS — E785 Hyperlipidemia, unspecified: Secondary | ICD-10-CM | POA: Diagnosis not present

## 2016-01-06 DIAGNOSIS — Z8669 Personal history of other diseases of the nervous system and sense organs: Secondary | ICD-10-CM | POA: Diagnosis not present

## 2016-01-06 DIAGNOSIS — I10 Essential (primary) hypertension: Secondary | ICD-10-CM

## 2016-01-06 MED ORDER — VENLAFAXINE HCL ER 75 MG PO CP24: 75.0000 mg | ORAL_CAPSULE | Freq: Every day | ORAL | 0 refills | Status: DC

## 2016-01-06 MED ORDER — POTASSIUM CHLORIDE ER 10 MEQ PO TBCR: 10.0000 meq | EXTENDED_RELEASE_TABLET | Freq: Every day | ORAL | 2 refills | Status: DC

## 2016-01-06 NOTE — Progress Notes (Signed)
Subjective:    Patient ID: [name] [name], female    DOB: [birth year], 55 y.o.   MRN: [id number]  HPI  55 year old Female history of hypertension in today for health maintenance exam. Has been recently treated for diverticulitis by Dr. Ewing Schlein. Had 10 day course of Augmentin prescribed. Still having some issues with right upper quadrant discomfort.  She had lab work done by Dr. Ewing Schlein .That day creatinine was elevated at 1.31. Repeated it and is now within normal limits. She could've been a bit volume depleted that day as she had been ill with abdominal pain prior to seeing him the day before.  She also has significant hyperlipidemia and is statin intolerant. Her husband who is a radiologist wants her to see about getting started on something such as Repatha.. Probably the best option is for her to see cardiologist to see if there pharmacologist can get that approved. She says statins caused memory issues for her and that she had tried them all previously. She's been unwilling to try any of them live been taking care of her.  With regard to hypertension, her blood pressure is much better having added amlodipine. Her potassium is 3.6. I'm starting her on potassium chloride 10 mEq daily. She'll follow-up. A month and will have basic metabolic panel at that time.  I think we'll go ahead and also order a hepatobiliary scan for persistent right upper quadrant abdominal pain. Dr. Ewing Schlein did mention that in his note as an option.  She's going out of town for the next week or so on a biking trip to [place]. In early October she is going to [place].  She doesn't have a gynecologist. She's been having a lot of hot flashes. FSH can be drawn at next visit. She is status post hysterectomy. Think she's been having hot flashes for a couple of years. She does not want to be on hormone replacement.  With regard to anxiety depression, she's been on Effexor for mood stabilization and also to help with migraine  headaches. She says Effexor is not helped her headaches and she wants to come off of it. She is currently on 150 mg XR daily. I reduced it to 75 mg X are intact visit we can reduce it even further down to 37.5 mg daily. It will take about 3 months to get her off this medication.  She saw Dr. Anne Hahn recently for migraines and he put her on low-dose Topamax 25 mg daily. She says her migraines of gotten worse over the past year.    Review of Systems     Objective:   Physical Exam  Constitutional: She appears well-developed and well-nourished. No distress.  HENT:  Head: Normocephalic and atraumatic.  Right Ear: External ear normal.  Left Ear: External ear normal.  Mouth/Throat: No oropharyngeal exudate.  Eyes: Conjunctivae and EOM are normal. Pupils are equal, round, and reactive to light. Right eye exhibits no discharge. Left eye exhibits no discharge.  Neck: Neck supple. No JVD present. No thyromegaly present.  Cardiovascular: Normal rate, regular rhythm, normal heart sounds and intact distal pulses.   No murmur heard. Pulmonary/Chest: No respiratory distress. She has no wheezes. She has no rales.  Abdominal: Soft. Bowel sounds are normal. She exhibits no distension and no mass. There is no tenderness. There is no rebound and no guarding.  Musculoskeletal: She exhibits no edema.  Lymphadenopathy:    She has no cervical adenopathy.  Neurological: She is alert. She has normal reflexes.  No cranial nerve deficit. Coordination normal.  Skin: Skin is warm and dry. No rash noted. She is not diaphoretic.  Psychiatric: She has a normal mood and affect. Her behavior is normal. Thought content normal.  Vitals reviewed.         Assessment & Plan:  Normal health maintenance exam  Abdominal pain-? Chronic cholecystitis. Have hepatobiliary scan in the near future  Essential hypertension- Continue Norvasc and losartan HCTZ  Hyperlipidemia-statin intolerant. Referral to Cardiology to see if  she is candidate for new or lipid-lowering medications  Anxiety depression-patient wants to get off Effexor and f have been indicated to her how to taper that  Migraine headaches treated with Topamax  Has cardiology appointment for lipid evaluation/management late October. Return here early October.

## 2016-01-07 ENCOUNTER — Other Ambulatory Visit: Payer: Self-pay | Admitting: Internal Medicine

## 2016-01-07 ENCOUNTER — Other Ambulatory Visit (HOSPITAL_COMMUNITY)
Admission: RE | Admit: 2016-01-07 | Discharge: 2016-01-07 | Disposition: A | Discharge status: Home / Self Care | Payer: PRIVATE HEALTH INSURANCE | Source: Ambulatory Visit | Attending: Internal Medicine | Admitting: Internal Medicine

## 2016-01-07 DIAGNOSIS — Z01419 Encounter for gynecological examination (general) (routine) without abnormal findings: Secondary | ICD-10-CM | POA: Insufficient documentation

## 2016-01-07 LAB — POCT URINALYSIS DIPSTICK
BILIRUBIN UA: NEGATIVE
GLUCOSE UA: NEGATIVE
KETONES UA: NEGATIVE
Leukocytes, UA: NEGATIVE
Nitrite, UA: NEGATIVE
Protein, UA: NEGATIVE
RBC UA: NEGATIVE
SPEC GRAV UA: 1.02
UROBILINOGEN UA: 0.2
pH, UA: 5

## 2016-01-07 NOTE — Telephone Encounter (Signed)
-----   Message from Margaree MackintoshMary J Baxley, MD sent at 12/31/2015  9:57 PM EDT ----- Continue Losartan Creatinine [facility]

## 2016-01-07 NOTE — Telephone Encounter (Signed)
Patient had OV on 01/06/16.

## 2016-01-07 NOTE — Telephone Encounter (Signed)
[facility] to [facility]. Patient scheduled to have HIDA scan on 01/18/16. Arrival time: 10:45 for 11:00 appt NPO after midnight Scan is 2hrs; contact# [phone] Darl PikesSusan or [phone] [facility]. Called patient. No answer. Will try later.

## 2016-01-07 NOTE — Telephone Encounter (Signed)
Patient returned call. Advised imaging scheduled. Patient requested I call back leave vmail w/ instructions. Left vmail as patient requested.

## 2016-01-08 LAB — CYTOLOGY - PAP

## 2016-01-14 ENCOUNTER — Ambulatory Visit: Payer: Self-pay | Admitting: Neurology

## 2016-01-18 ENCOUNTER — Encounter (HOSPITAL_COMMUNITY)
Admission: RE | Admit: 2016-01-18 | Discharge: 2016-01-18 | Disposition: A | Discharge status: Home / Self Care | Payer: PRIVATE HEALTH INSURANCE | Source: Ambulatory Visit | Attending: Internal Medicine | Admitting: Internal Medicine

## 2016-01-18 ENCOUNTER — Other Ambulatory Visit: Payer: Self-pay | Admitting: Internal Medicine

## 2016-01-18 ENCOUNTER — Other Ambulatory Visit (HOSPITAL_COMMUNITY): Payer: Self-pay | Admitting: Radiology

## 2016-01-18 DIAGNOSIS — R1011 Right upper quadrant pain: Secondary | ICD-10-CM

## 2016-01-18 MED ORDER — TECHNETIUM TC 99M MEBROFENIN IV KIT
5.4600 | PACK | Freq: Once | INTRAVENOUS | Status: AC | PRN
  Administered 2016-01-18: 5 via INTRAVENOUS

## 2016-01-28 ENCOUNTER — Telehealth: Payer: Self-pay | Admitting: *Deleted

## 2016-01-28 ENCOUNTER — Telehealth: Payer: Self-pay | Admitting: Neurology

## 2016-01-28 NOTE — Patient Instructions (Addendum)
Hepatobiliary scan to be performed. Referral to cardiology to see if you are a candidate for new or cholesterol lowering medications. Taper Effexor as directed.

## 2016-01-28 NOTE — Telephone Encounter (Signed)
Patient called requesting to speak with nurse regarding topiramate (TOPAMAX) 25 MG tablet, please call [phone].

## 2016-01-28 NOTE — Telephone Encounter (Signed)
Called in Xanax 0.25mg  and Cipro 500mg  per verbal orders from Dr. Lenord FellersBaxley

## 2016-01-28 NOTE — Telephone Encounter (Signed)
Spoke to pt who says that when picking up refill, the pharmacy told her to follow instructions on the new bottle of topiramate but she'd already titrated up to 75 mg qhs. Pt was instructed to continue med at current dose. She requested that new rx be sent in for 75 mg caps (in order to take fewer pills) but med comes only in 25, 50 and 100 mg. She verbalized understanding and appreciation for call.

## 2016-02-02 ENCOUNTER — Ambulatory Visit (INDEPENDENT_AMBULATORY_CARE_PROVIDER_SITE_OTHER): Payer: PRIVATE HEALTH INSURANCE | Admitting: Internal Medicine

## 2016-02-02 ENCOUNTER — Encounter: Payer: Self-pay | Admitting: Internal Medicine

## 2016-02-02 ENCOUNTER — Other Ambulatory Visit: Payer: Self-pay | Admitting: Internal Medicine

## 2016-02-02 VITALS — BP 120/88 | HR 85 | Temp 97.4°F | Wt 178.0 lb

## 2016-02-02 DIAGNOSIS — E7800 Pure hypercholesterolemia, unspecified: Secondary | ICD-10-CM

## 2016-02-02 DIAGNOSIS — I10 Essential (primary) hypertension: Secondary | ICD-10-CM

## 2016-02-02 DIAGNOSIS — R1011 Right upper quadrant pain: Secondary | ICD-10-CM

## 2016-02-02 MED ORDER — VENLAFAXINE HCL ER 75 MG PO CP24: 75.0000 mg | ORAL_CAPSULE | Freq: Every day | ORAL | 0 refills | Status: DC

## 2016-02-02 MED ORDER — VENLAFAXINE HCL ER 75 MG PO CP24: ORAL_CAPSULE | ORAL | 0 refills | Status: DC

## 2016-02-02 NOTE — Progress Notes (Signed)
   Subjective:    Patient ID: [name] EhrlichJulie Paradise [name], female    DOB: 09/20/1960, 55 y.o.   MRN: [id number]  HPI Leaving tomorrow to go to [place] for 2 weeks on a vacation. Has travel medication already called in. She's feeling well. Abdominal issues have gotten better. Hepatobiliary scan was negative.  Is here to follow-up on hypertension and tapering of Effexor therapy. She's been taking Effexor for some time and previously was on 150 mg. We are going to decrease to 75 mg 4 months and then to 37.5 mg for a month and then to 37.5 mg every other day for a month.  She has appointment soon to see Cardiologist to discuss whether she is a candidate for newer cholesterol lowering medication since she is intolerant of statins.  BP is stable on current regumen   Review of Systems     Objective:   Physical Exam  BP 120/88 . Chest clear to auscultation. Cardiac exam regular rate and rhythm. Extremities without edema. Affect is bright. No abdominal complaints.      Assessment & Plan:  Essential hypertension-stable  Hyperlipidemia-statin intolerant  Anxiety depression-tapering Effexor  Abdominal pain-workup essentially negative for gallbladder disease.  Plan: She will see cardiologist soon about newer cholesterol lowering drugs. Continue same medications.  Follow-up 4-6 months

## 2016-02-03 LAB — BASIC METABOLIC PANEL
BUN: 16 mg/dL (ref 7–25)
CALCIUM: 9.2 mg/dL (ref 8.6–10.4)
CHLORIDE: 105 mmol/L (ref 98–110)
CO2: 25 mmol/L (ref 20–31)
CREATININE: 0.93 mg/dL (ref 0.50–1.05)
Glucose, Bld: 107 mg/dL — ABNORMAL HIGH (ref 65–99)
Potassium: 3.6 mmol/L (ref 3.5–5.3)
Sodium: 139 mmol/L (ref 135–146)

## 2016-02-05 ENCOUNTER — Other Ambulatory Visit: Payer: Self-pay | Admitting: *Deleted

## 2016-02-05 NOTE — Progress Notes (Signed)
Attempted to call Pt, number in chart spoke in a spanish recording, and then beeped. Unable to leave msg. Will try again in a few weeks.

## 2016-02-20 NOTE — Progress Notes (Signed)
Cardiology Office Note   Date:  02/22/2016   ID:  [name], DOB [birth year], MRN [id number]  PCP:  Margaree Mackintosh, MD  Cardiologist:   Rollene Rotunda, MD  Referring:  Margaree Mackintosh, MD  Chief Complaint  Patient presents with  . Dyslipidemia      History of Present Illness: [name] [name] is a 55 y.o. female who presents for evaluation of dyslipidemia. She has no past cardiac history other than some mild mitral regurgitation I see an echo in 2010. She said she had mild mitral valve prolapse but I don't see evidence of this. She did have treadmill testing years ago because of some atypical chest pain. Otherwise she does have a family history with her dad having heart disease 79s. She herself has never been found to have any suggestion of ischemic heart disease but she does have risk factors with hypertension and dyslipidemia. She's been intolerant of statins in the past. She is referred here for evaluation of dyslipidemia. She denies any cardiovascular symptoms. The patient denies any new symptoms such as chest discomfort, neck or arm discomfort. There has been no new shortness of breath, PND or orthopnea. There have been no reported palpitations, presyncope or syncope.  She was recently hiking in the [place] and had no symptoms related to this.   Past Medical History:  Diagnosis Date  . Classic migraine 09/18/2013  . Diplopia 09/18/2013  . Diverticulitis   . Generalized headaches   . Hyperlipidemia   . Hypertension     Past Surgical History:  Procedure Laterality Date  . ABDOMINAL HYSTERECTOMY    . CESAREAN SECTION     x 2  . LAPAROSCOPIC APPENDECTOMY N/A 07/31/2014   Procedure: APPENDECTOMY LAPAROSCOPIC;  Surgeon: Ovidio Kin, MD;  Location: [facility];  Service: General;  Laterality: N/A;  . tendon surgery on elbow    . TONSILLECTOMY       Current Outpatient Prescriptions  Medication Sig Dispense Refill  . ALPRAZolam (XANAX) 0.25 MG tablet Take 1 tablet (0.25  mg total) by mouth 2 (two) times daily as needed for anxiety. 30 tablet 0  . amLODipine (NORVASC) 5 MG tablet Take 1 tablet (5 mg total) by mouth daily. 90 tablet 3  . losartan-hydrochlorothiazide (HYZAAR) 100-25 MG tablet TAKE 1 TABLET BY MOUTH DAILY 90 tablet 0  . naproxen sodium (ANAPROX) 220 MG tablet Take 220 mg by mouth daily as needed (headache and pain).    . ondansetron (ZOFRAN) 4 MG tablet Take 1 tablet (4 mg total) by mouth every 8 (eight) hours as needed for nausea or vomiting. 20 tablet 0  . potassium chloride (K-DUR) 10 MEQ tablet Take 1 tablet (10 mEq total) by mouth daily. 30 tablet 2  . predniSONE (DELTASONE) 5 MG tablet Begin taking 6 tablets daily, taper by one tablet daily until off the medication. 21 tablet 0  . rizatriptan (MAXALT-MLT) 10 MG disintegrating tablet Take 1 tablet (10 mg total) by mouth 3 (three) times daily as needed for migraine. 9 tablet 3  . topiramate (TOPAMAX) 25 MG tablet Take one tablet at night for one week, then take 2 tablets at night for one week, then take 3 tablets at night. 90 tablet 3   No current facility-administered medications for this visit.     Allergies:   Ramipril and Sulfa antibiotics    Social History:  The patient  reports that she has never smoked. She has never used smokeless tobacco. She reports that she drinks about  8.4 oz of alcohol per week . She reports that she does not use drugs.   Family History:  The patient's family history includes Alcohol abuse in her brother; Diabetes in her brother; Heart disease (age of onset: 8) in her father; Hyperlipidemia in her sister; Hypertension in her brother, father, and sister; Prostate cancer in her father; Renal cancer in her sister.    ROS:  Please see the history of present illness.   Otherwise, review of systems are positive for none.   All other systems are reviewed and negative.    PHYSICAL EXAM: VS:  BP 126/90 (BP Location: Left Arm, Patient Position: Sitting, Cuff Size:  Normal)   Pulse 88   Ht 5\' 5"  (1.651 m)   Wt 175 lb (79.4 kg)   BMI 29.12 kg/m  , BMI Body mass index is 29.12 kg/m. GENERAL:  Well appearing HEENT:  Pupils equal round and reactive, fundi not visualized, oral mucosa unremarkable NECK:  No jugular venous distention, waveform within normal limits, carotid upstroke brisk and symmetric, no bruits, no thyromegaly LYMPHATICS:  No cervical, inguinal adenopathy LUNGS:  Clear to auscultation bilaterally BACK:  No CVA tenderness CHEST:  Unremarkable HEART:  PMI not displaced or sustained,S1 and S2 within normal limits, no S3, no S4, no clicks, no rubs, no murmurs ABD:  Flat, positive bowel sounds normal in frequency in pitch, no bruits, no rebound, no guarding, no midline pulsatile mass, no hepatomegaly, no splenomegaly EXT:  2 plus pulses throughout, no edema, no cyanosis no clubbing SKIN:  No rashes no nodules NEURO:  Cranial nerves II through XII grossly intact, motor grossly intact throughout PSYCH:  Cognitively intact, oriented to person place and time    EKG:  EKG is ordered today. The ekg ordered today demonstrates sinus rhythm, rate 88, axis within normal limits, intervals within normal limits, nonspecific lateral T-wave flattening.   Recent Labs: 02/02/2016: BUN 16; Creat 0.93; Potassium 3.6; Sodium 139    Lipid Panel    Component Value Date/Time   CHOL 259 (H) 03/21/2014 0935   TRIG 209 (H) 03/21/2014 0935   HDL 55 03/21/2014 0935   CHOLHDL 4.7 03/21/2014 0935   VLDL 42 (H) 03/21/2014 0935   LDLCALC 162 (H) 03/21/2014 0935   LDLDIRECT 130.7 11/27/2008 1141      Wt Readings from Last 3 Encounters:  02/22/16 175 lb (79.4 kg)  02/02/16 178 lb (80.7 kg)  01/06/16 179 lb 8 oz (81.4 kg)      Other studies Reviewed: Additional studies/ records that were reviewed today include: Labs. Review of the above records demonstrates:  Please see elsewhere in the note.     ASSESSMENT AND PLAN:  DYSLIPIDEMIA;   At this point I  plan a coronary calcium score to further evaluate her risk and guide therapy. She will now I will have a conversation based on these results. We also discussed diet at length. She eats a lot of shellfish and eggs and she needs to reduce these.  HTN:  The blood pressure is at target. No change in medications is indicated. We will continue with therapeutic lifestyle changes (TLC).  Current medicines are reviewed at length with the patient today.  The patient does not have concerns regarding medicines.  The following changes have been made:  no change  Labs/ tests ordered today include:   Orders Placed This Encounter  Procedures  . CT CARDIAC SCORING  . EKG 12-Lead     Disposition:   FU with me as  needed.     Signed, Rollene RotundaJames Deundra Furber, MD  02/22/2016 3:31 PM    [facility]

## 2016-02-22 ENCOUNTER — Ambulatory Visit (INDEPENDENT_AMBULATORY_CARE_PROVIDER_SITE_OTHER): Payer: PRIVATE HEALTH INSURANCE | Admitting: Cardiology

## 2016-02-22 ENCOUNTER — Encounter: Payer: Self-pay | Admitting: Cardiology

## 2016-02-22 VITALS — BP 126/90 | HR 88 | Ht 65.0 in | Wt 175.0 lb

## 2016-02-22 DIAGNOSIS — E785 Hyperlipidemia, unspecified: Secondary | ICD-10-CM

## 2016-02-22 DIAGNOSIS — R079 Chest pain, unspecified: Secondary | ICD-10-CM | POA: Diagnosis not present

## 2016-02-22 NOTE — Patient Instructions (Signed)
Medication Instructions:  Continue current medications  Labwork: None Ordered  Testing/Procedures: Your physician has requested that you have a Coronary Calcium score Test. [facility].   Follow-Up: Your physician recommends that you schedule a follow-up appointment in: As Needed   Any Other Special Instructions Will Be Listed Below (If Applicable).     If you need a refill on your cardiac medications before your next appointment, please call your pharmacy.

## 2016-02-27 NOTE — Patient Instructions (Signed)
Continue to taper Effexor as directed. Continue same antihypertensive medications. See cardiologist regarding new or lipid lowering medications. Return in 4-6 months.

## 2016-03-01 ENCOUNTER — Inpatient Hospital Stay: Admission: RE | Admit: 2016-03-01 | Payer: PRIVATE HEALTH INSURANCE | Source: Ambulatory Visit

## 2016-03-02 ENCOUNTER — Ambulatory Visit (INDEPENDENT_AMBULATORY_CARE_PROVIDER_SITE_OTHER)
Admission: RE | Admit: 2016-03-02 | Discharge: 2016-03-02 | Disposition: A | Discharge status: Home / Self Care | Payer: Self-pay | Source: Ambulatory Visit | Attending: Cardiology | Admitting: Cardiology

## 2016-03-02 DIAGNOSIS — R079 Chest pain, unspecified: Secondary | ICD-10-CM

## 2016-03-04 ENCOUNTER — Telehealth: Payer: Self-pay | Admitting: *Deleted

## 2016-03-04 DIAGNOSIS — I7789 Other specified disorders of arteries and arterioles: Secondary | ICD-10-CM

## 2016-03-04 NOTE — Telephone Encounter (Signed)
-----   Message from Rollene RotundaJames Hochrein, MD sent at 03/03/2016 11:29 AM EDT ----- She has no coronary calcium.  However, she will need a CT angiogram to look at her aorta which appears to be enlarged.  This could be an error because this test was not designed to look specifically at the aorta.  We will do a dedicated study.  Call Ms. Kaneshiro with the results and send results to Margaree MackintoshBAXLEY,MARY J, MD

## 2016-03-04 NOTE — Telephone Encounter (Signed)
Pt aware of calcium scoring test, Ct angio ordered and send to scheduler to be schedule, pt is aware that someone will call and schedule her for the test.

## 2016-03-14 ENCOUNTER — Telehealth: Payer: Self-pay | Admitting: Internal Medicine

## 2016-03-14 NOTE — Telephone Encounter (Signed)
Patient calling regarding her refill on Effexor.  She was actually quite confusing trying to state what she needed; had to go back to your note from 10/3.  Patient states she needs a refill for 75mg  and 37.5mg  as well.  She thinks she's supposed to be taking 37.5 every other day.    Your note indicates 75 for 4 months, then to 37.5 for a month.  Is that still what she is to do?  75mg  for 3 more months, then down to 37.5 for a month?    Please clarify and we'll need to clarify for the patient because she seems to be confused on the directions based on what your last note says.    Thank you.

## 2016-03-14 NOTE — Telephone Encounter (Signed)
There is a typo. It is supposed to say 75 mg daily for 4 weeks (One Month), then 37.5 mg daily x 4 weeks (one month) then 37.5 mg every other day for 4 weeks( One month) so a total of   3   months to get off this.

## 2016-03-15 ENCOUNTER — Other Ambulatory Visit: Payer: Self-pay | Admitting: *Deleted

## 2016-03-15 DIAGNOSIS — I7789 Other specified disorders of arteries and arterioles: Secondary | ICD-10-CM

## 2016-03-15 MED ORDER — VENLAFAXINE HCL ER 37.5 MG PO CP24: ORAL_CAPSULE | ORAL | 0 refills | Status: DC

## 2016-03-15 MED ORDER — VENLAFAXINE HCL ER 37.5 MG PO CP24: 37.5000 mg | ORAL_CAPSULE | Freq: Every day | ORAL | 0 refills | Status: DC

## 2016-03-15 NOTE — Telephone Encounter (Signed)
Left message for patient to call office to clarify effexor dose.

## 2016-03-15 NOTE — Telephone Encounter (Signed)
Spoke with patient about her effexor.  She has been taking 75mg  daily for 4weeks.  She needs 37.5 filled to finish the medication.  Refill sent to pharmacy.  Advised patient to take 37.5 for 4 weeks then every other day until finished.

## 2016-03-17 ENCOUNTER — Other Ambulatory Visit: Payer: Self-pay | Admitting: *Deleted

## 2016-03-17 DIAGNOSIS — I7789 Other specified disorders of arteries and arterioles: Secondary | ICD-10-CM

## 2016-03-18 ENCOUNTER — Ambulatory Visit
Admission: RE | Admit: 2016-03-18 | Discharge: 2016-03-18 | Disposition: A | Discharge status: Home / Self Care | Payer: PRIVATE HEALTH INSURANCE | Source: Ambulatory Visit | Attending: Cardiology | Admitting: Cardiology

## 2016-03-18 ENCOUNTER — Ambulatory Visit (HOSPITAL_COMMUNITY): Payer: PRIVATE HEALTH INSURANCE

## 2016-03-18 DIAGNOSIS — I7789 Other specified disorders of arteries and arterioles: Secondary | ICD-10-CM

## 2016-03-18 MED ORDER — IOPAMIDOL (ISOVUE-370) INJECTION 76%
75.0000 mL | Freq: Once | INTRAVENOUS | Status: AC | PRN
  Administered 2016-03-18: 75 mL via INTRAVENOUS

## 2016-03-22 ENCOUNTER — Telehealth: Payer: Self-pay | Admitting: *Deleted

## 2016-03-22 DIAGNOSIS — I7789 Other specified disorders of arteries and arterioles: Secondary | ICD-10-CM

## 2016-03-22 NOTE — Telephone Encounter (Signed)
-----   Message from Rollene RotundaJames Hochrein, MD sent at 03/18/2016  8:56 PM EST ----- Mild aortic enlargement.  The recommendation is to repeat this in one year.  Please schedule a follow up in one year.  Call Ms. Lehn with the results and send results to Margaree MackintoshBAXLEY,MARY J, MD

## 2016-03-22 NOTE — Telephone Encounter (Signed)
CT angio chest ordered for 1 year

## 2016-03-28 ENCOUNTER — Other Ambulatory Visit: Payer: Self-pay | Admitting: Internal Medicine

## 2016-04-06 ENCOUNTER — Other Ambulatory Visit: Payer: Self-pay | Admitting: Internal Medicine

## 2016-04-10 ENCOUNTER — Other Ambulatory Visit: Payer: Self-pay | Admitting: Neurology

## 2016-04-11 NOTE — Telephone Encounter (Signed)
Patient is calling stating  topiramate (TOPAMAX) 25 MG tablet was refused at the pharmacy. I told her she needs an appointment but she says Dr. Anne HahnWillis told her a follow up was not needed. Please call and discuss.

## 2016-04-12 MED ORDER — TOPIRAMATE 25 MG PO TABS: 75.0000 mg | ORAL_TABLET | Freq: Every day | ORAL | 3 refills | Status: DC

## 2016-04-12 NOTE — Telephone Encounter (Signed)
Pt was seen for OV in August for migraines. Her BP was elevated at that time and will need to be followed. She was started on Topamax and OV notes instructed that she follow-up in 3 months. She has not scheduled a revisit but is requesting refill on medication. She is tolerating Topamax 75 mg w/o difficulty.

## 2016-04-12 NOTE — Addendum Note (Signed)
Addended by: Stephanie AcreWILLIS, Faizah Kandler on: 04/12/2016 08:37 AM   Modules accepted: Orders

## 2016-04-12 NOTE — Telephone Encounter (Signed)
I will call in refill for the Topamax, but if the patient is getting medication through this office, she will need follow-up.

## 2016-04-13 ENCOUNTER — Encounter: Payer: Self-pay | Admitting: Nurse Practitioner

## 2016-04-13 ENCOUNTER — Ambulatory Visit (INDEPENDENT_AMBULATORY_CARE_PROVIDER_SITE_OTHER): Payer: PRIVATE HEALTH INSURANCE | Admitting: Nurse Practitioner

## 2016-04-13 VITALS — BP 120/92 | HR 86 | Ht 65.0 in

## 2016-04-13 DIAGNOSIS — G43111 Migraine with aura, intractable, with status migrainosus: Secondary | ICD-10-CM | POA: Diagnosis not present

## 2016-04-13 NOTE — Progress Notes (Signed)
I have read the note, and I agree with the clinical assessment and plan.  WILLIS,CHARLES KEITH   

## 2016-04-13 NOTE — Telephone Encounter (Signed)
Spoke w/ patient and she has a follow-up scheduled this afternoon w/ Eber Jonesarolyn NP.

## 2016-04-13 NOTE — Progress Notes (Signed)
[facility]  PATIENT: [name] EhrlichJulie Paradise [name] DOB: 02/27/1961   REASON FOR VISIT: Follow-up for headaches HISTORY FROM: Patient    HISTORY OF PRESENT ILLNESS:UPDATE 04/13/2016 [name] [name], 55 year old female returns for follow-up. She has a history of headaches/classic migraine. She was placed on Topamax at her last visit with Dr. Anne HahnWillis and her migraines are doing extremely well. She has not had to take Maxalt. She does complain with some numbness and tingling in the fingers and toes which is very intermittent. Patient was made aware he could decrease the dose but she does not wish to do that. She returns for reevaluation   HISTORY 12/28/15 KWMs. [name] [name] is a 55 year old right-handed white female with a history of classic migraine headaches. Her usual headaches occur once or twice a week and are associated with blurring out of the visual fields, onset of a headache that is generally fairly mild. The patient usually does not take any medication at all for her migraine. Within the last 3 weeks, her headaches have changed somewhat. Her headaches are now mainly behind the right eye, sometimes spreading to behind the left eye. She may have some discomfort in the top and back of the head, and she may have some neck stiffness. The patient will have photophobia and phonophobia, sensitivity to odors. She will have some nausea, she has not had any vomiting. The patient denies any numbness or weakness of the extremities. She does feel cloudy headed with the headache and has difficulty with concentration. The headaches may go on for several days. She has had 4 headaches over the last 3 weeks. She comes to this office for an evaluation. When the headache is severe, it is disabling, she is not able to function.   REVIEW OF SYSTEMS: Full 14 system review of systems performed and notable only for those listed, all others are neg:  Constitutional: neg  Cardiovascular: neg Ear/Nose/Throat: neg    Skin: neg Eyes: neg Respiratory: neg Gastroitestinal: neg  Hematology/Lymphatic: neg  Endocrine: neg Musculoskeletal:neg Allergy/Immunology: neg Neurological: neg Psychiatric: neg Sleep : neg   ALLERGIES: Allergies  Allergen Reactions  . Ramipril Cough  . Sulfa Antibiotics Other (See Comments)    Itching.    HOME MEDICATIONS: Outpatient Medications Prior to Visit  Medication Sig Dispense Refill  . ALPRAZolam (XANAX) 0.25 MG tablet Take 1 tablet (0.25 mg total) by mouth 2 (two) times daily as needed for anxiety. 30 tablet 0  . amLODipine (NORVASC) 5 MG tablet Take 1 tablet (5 mg total) by mouth daily. 90 tablet 3  . losartan-hydrochlorothiazide (HYZAAR) 100-25 MG tablet TAKE 1 TABLET BY MOUTH DAILY 90 tablet 0  . potassium chloride (K-DUR) 10 MEQ tablet TAKE 1 TABLET(10 MEQ) BY MOUTH DAILY 30 tablet 5  . rizatriptan (MAXALT-MLT) 10 MG disintegrating tablet Take 1 tablet (10 mg total) by mouth 3 (three) times daily as needed for migraine. 9 tablet 3  . topiramate (TOPAMAX) 25 MG tablet Take 3 tablets (75 mg total) by mouth at bedtime. 90 tablet 3  . venlafaxine XR (EFFEXOR XR) 37.5 MG 24 hr capsule Take 1 daily x 4 wks, then everyother day until finished. 45 capsule 0  . naproxen sodium (ANAPROX) 220 MG tablet Take 220 mg by mouth daily as needed (headache and pain).    . ondansetron (ZOFRAN) 4 MG tablet Take 1 tablet (4 mg total) by mouth every 8 (eight) hours as needed for nausea or vomiting. (Patient not taking: Reported on 04/13/2016) 20 tablet 0  .  predniSONE (DELTASONE) 5 MG tablet Begin taking 6 tablets daily, taper by one tablet daily until off the medication. (Patient not taking: Reported on 04/13/2016) 21 tablet 0  . venlafaxine XR (EFFEXOR XR) 37.5 MG 24 hr capsule Take 1 capsule (37.5 mg total) by mouth daily with breakfast. (Patient not taking: Reported on 04/13/2016) 45 capsule 0   No facility-administered medications prior to visit.     PAST MEDICAL  HISTORY: Past Medical History:  Diagnosis Date  . Classic migraine 09/18/2013  . Diplopia 09/18/2013  . Diverticulitis   . Generalized headaches   . Hyperlipidemia   . Hypertension     PAST SURGICAL HISTORY: Past Surgical History:  Procedure Laterality Date  . ABDOMINAL HYSTERECTOMY    . CESAREAN SECTION     x 2  . LAPAROSCOPIC APPENDECTOMY N/A 07/31/2014   Procedure: APPENDECTOMY LAPAROSCOPIC;  Surgeon: Ovidio Kin, MD;  Location: [facility];  Service: General;  Laterality: N/A;  . tendon surgery on elbow    . TONSILLECTOMY      FAMILY HISTORY: Family History  Problem Relation Age of Onset  . Prostate cancer Father   . Heart disease Father 50    CABGs x 3  . Hypertension Father   . Hyperlipidemia Sister   . Hypertension Sister   . Renal cancer Sister   . Alcohol abuse Brother   . Diabetes Brother     PVD  . Hypertension Brother     SOCIAL HISTORY: Social History   Social History  . Marital status: Married    Spouse name: N/A  . Number of children: 2  . Years of education: college   Occupational History  . artist    Social History Main Topics  . Smoking status: Never Smoker  . Smokeless tobacco: Never Used  . Alcohol use 8.4 oz/week    14 Glasses of wine per week     Comment: daily wine  . Drug use: No  . Sexual activity: Yes   Other Topics Concern  . Not on file   Social History Narrative   Lives at home w/ her husband   Right-handed   Caffeine: weekly coffee and daily hot tea        PHYSICAL EXAM  Vitals:   04/13/16 1242  BP: (!) 120/92  Pulse: 86  Height: 5\' 5"  (1.651 m)   There is no height or weight on file to calculate BMI.  Generalized: Well developed, in no acute distress  Head: normocephalic and atraumatic,. Oropharynx benign  Neck: Supple, no carotid bruits  Cardiac: Regular rate rhythm, no murmur  Musculoskeletal: No deformity   Neurological examination   Mentation: Alert oriented to time, place, history taking. Attention  span and concentration appropriate. Recent and remote memory intact.  Follows all commands speech and language fluent.   Cranial nerve II-XII: Pupils were equal round reactive to light extraocular movements were full, visual field were full on confrontational test. Facial sensation and strength were normal. hearing was intact to finger rubbing bilaterally. Uvula tongue midline. head turning and shoulder shrug were normal and symmetric.Tongue protrusion into cheek strength was normal. Motor: normal bulk and tone, full strength in the BUE, BLE, fine finger movements normal, no pronator drift. No focal weakness Sensory: normal and symmetric to light touch, pinprick, and  Vibration, In the upper and lower extremities Coordination: finger-nose-finger, heel-to-shin bilaterally, no dysmetria Reflexes: Brachioradialis 2/2, biceps 2/2, triceps 2/2, patellar 2/2, Achilles 2/2, plantar responses were flexor bilaterally. Gait and Station: Rising up from  seated position without assistance, normal stance,  moderate stride, good arm swing, smooth turning, able to perform tiptoe, and heel walking without difficulty. Tandem gait is steady  DIAGNOSTIC DATA (LABS, IMAGING, TESTING) -    Component Value Date/Time   NA 139 02/02/2016 1203   K 3.6 02/02/2016 1203   CL 105 02/02/2016 1203   CO2 25 02/02/2016 1203   GLUCOSE 107 (H) 02/02/2016 1203   BUN 16 02/02/2016 1203   CREATININE 0.93 02/02/2016 1203   CALCIUM 9.2 02/02/2016 1203   PROT 6.8 09/08/2014 1650   ALBUMIN 4.2 09/08/2014 1650   AST 24 09/08/2014 1650   ALT 21 09/08/2014 1650   ALKPHOS 97 09/08/2014 1650   BILITOT 0.4 09/08/2014 1650   GFRNONAA 71 (L) 07/30/2014 2110   GFRAA 82 (L) 07/30/2014 2110     ASSESSMENT AND PLAN  55 y.o. year old female  here to follow-up for classic migraine. She was placed on Topamax at her last visit and has been doing extremely well.  PLAN: Continue Topamax at current dose Continue Maxalt if necessary Call if  headaches worsen F/U in 6 months Nilda RiggsNancy Carolyn Orion Mole, [facility]NP, [facility], APRN  [facility] [address], [address] [place], [place] [address] [phone]

## 2016-04-13 NOTE — Patient Instructions (Signed)
Continue Topamax at current dose Continue Maxalt if necessary F/U in 6 months

## 2016-05-05 ENCOUNTER — Telehealth: Payer: Self-pay | Admitting: Internal Medicine

## 2016-05-05 MED ORDER — VENLAFAXINE HCL ER 75 MG PO CP24: 75.0000 mg | ORAL_CAPSULE | Freq: Every day | ORAL | 2 refills | Status: DC

## 2016-05-05 NOTE — Telephone Encounter (Addendum)
Patient called because she's been trying to wean herself off of the Effexor XR and would like to increase it back to 75 MG. [facility] [address]. Please advise.  Call in Effexor 75 mg #30 with 2 refills 1 by mouth daily

## 2016-05-11 ENCOUNTER — Other Ambulatory Visit: Payer: Self-pay | Admitting: Internal Medicine

## 2016-05-11 DIAGNOSIS — Z1231 Encounter for screening mammogram for malignant neoplasm of breast: Secondary | ICD-10-CM

## 2016-05-12 ENCOUNTER — Ambulatory Visit
Admission: RE | Admit: 2016-05-12 | Discharge: 2016-05-12 | Disposition: A | Discharge status: Home / Self Care | Payer: PRIVATE HEALTH INSURANCE | Source: Ambulatory Visit | Attending: Internal Medicine | Admitting: Internal Medicine

## 2016-05-12 DIAGNOSIS — Z1231 Encounter for screening mammogram for malignant neoplasm of breast: Secondary | ICD-10-CM

## 2016-05-13 ENCOUNTER — Other Ambulatory Visit: Payer: Self-pay | Admitting: Internal Medicine

## 2016-05-13 DIAGNOSIS — R928 Other abnormal and inconclusive findings on diagnostic imaging of breast: Secondary | ICD-10-CM

## 2016-05-25 ENCOUNTER — Ambulatory Visit
Admission: RE | Admit: 2016-05-25 | Discharge: 2016-05-25 | Disposition: A | Discharge status: Home / Self Care | Payer: PRIVATE HEALTH INSURANCE | Source: Ambulatory Visit | Attending: Internal Medicine | Admitting: Internal Medicine

## 2016-05-25 DIAGNOSIS — R928 Other abnormal and inconclusive findings on diagnostic imaging of breast: Secondary | ICD-10-CM

## 2016-07-06 ENCOUNTER — Other Ambulatory Visit: Payer: Self-pay | Admitting: Internal Medicine

## 2016-07-27 ENCOUNTER — Ambulatory Visit (INDEPENDENT_AMBULATORY_CARE_PROVIDER_SITE_OTHER): Payer: PRIVATE HEALTH INSURANCE | Admitting: Nurse Practitioner

## 2016-07-27 ENCOUNTER — Encounter (INDEPENDENT_AMBULATORY_CARE_PROVIDER_SITE_OTHER): Payer: Self-pay

## 2016-07-27 ENCOUNTER — Encounter: Payer: Self-pay | Admitting: Nurse Practitioner

## 2016-07-27 VITALS — BP 127/82 | HR 89

## 2016-07-27 DIAGNOSIS — G43101 Migraine with aura, not intractable, with status migrainosus: Secondary | ICD-10-CM

## 2016-07-27 MED ORDER — RIZATRIPTAN BENZOATE 10 MG PO TBDP: 10.0000 mg | ORAL_TABLET | Freq: Three times a day (TID) | ORAL | 3 refills | Status: DC | PRN

## 2016-07-27 NOTE — Progress Notes (Signed)
[facility]  PATIENT: [name] [name] DOB: 08/27/1960   REASON FOR VISIT: Follow-up for headaches HISTORY FROM: Patient    HISTORY OF PRESENT ILLNESS:UPDATE 3/28/18CM  Ms. [name] [name], 56 year old female returns for follow-up. She has a history of headaches/classic migraine. She was placed on Topamax in August of last year by  Dr. Anne HahnWillis and her migraines are doing extremely well however her appointment today due to side effects of the drug such as lethargy metallic taste word searching problems numbness and tingling. She has not had to take Maxalt. She denies any migraines in the last 4 months Patient wants to be titrated off the medication to see how she does, she may need a preventive in the future. She says her only migraine triggers are red wine. She returns for reevaluation   HISTORY 12/28/15 KWMs. [name] SporeClark is a 56 year old right-handed white female with a history of classic migraine headaches. Her usual headaches occur once or twice a week and are associated with blurring out of the visual fields, onset of a headache that is generally fairly mild. The patient usually does not take any medication at all for her migraine. Within the last 3 weeks, her headaches have changed somewhat. Her headaches are now mainly behind the right eye, sometimes spreading to behind the left eye. She may have some discomfort in the top and back of the head, and she may have some neck stiffness. The patient will have photophobia and phonophobia, sensitivity to odors. She will have some nausea, she has not had any vomiting. The patient denies any numbness or weakness of the extremities. She does feel cloudy headed with the headache and has difficulty with concentration. The headaches may go on for several days. She has had 4 headaches over the last 3 weeks. She comes to this office for an evaluation. When the headache is severe, it is disabling, she is not able to function.   REVIEW OF SYSTEMS: Full  14 system review of systems performed and notable only for those listed, all others are neg:  Constitutional: neg  Cardiovascular: neg Ear/Nose/Throat: neg  Skin: neg Eyes: neg Respiratory: neg Gastroitestinal: neg  Hematology/Lymphatic: neg  Endocrine: neg Musculoskeletal:neg Allergy/Immunology: neg Neurological: neg Psychiatric: neg Sleep : neg   ALLERGIES: Allergies  Allergen Reactions  . Ramipril Cough  . Sulfa Antibiotics Other (See Comments)    Itching.    HOME MEDICATIONS: Outpatient Medications Prior to Visit  Medication Sig Dispense Refill  . ALPRAZolam (XANAX) 0.25 MG tablet Take 1 tablet (0.25 mg total) by mouth 2 (two) times daily as needed for anxiety. 30 tablet 0  . amLODipine (NORVASC) 5 MG tablet Take 1 tablet (5 mg total) by mouth daily. 90 tablet 3  . losartan-hydrochlorothiazide (HYZAAR) 100-25 MG tablet TAKE 1 TABLET BY MOUTH DAILY 90 tablet 0  . potassium chloride (K-DUR) 10 MEQ tablet TAKE 1 TABLET(10 MEQ) BY MOUTH DAILY 30 tablet 5  . rizatriptan (MAXALT-MLT) 10 MG disintegrating tablet Take 1 tablet (10 mg total) by mouth 3 (three) times daily as needed for migraine. 9 tablet 3  . topiramate (TOPAMAX) 25 MG tablet Take 3 tablets (75 mg total) by mouth at bedtime. 90 tablet 3  . venlafaxine XR (EFFEXOR XR) 75 MG 24 hr capsule Take 1 capsule (75 mg total) by mouth daily with breakfast. 30 capsule 2   No facility-administered medications prior to visit.     PAST MEDICAL HISTORY: Past Medical History:  Diagnosis Date  . Classic migraine 09/18/2013  .  Diplopia 09/18/2013  . Diverticulitis   . Generalized headaches   . Hyperlipidemia   . Hypertension     PAST SURGICAL HISTORY: Past Surgical History:  Procedure Laterality Date  . ABDOMINAL HYSTERECTOMY    . CESAREAN SECTION     x 2  . LAPAROSCOPIC APPENDECTOMY N/A 07/31/2014   Procedure: APPENDECTOMY LAPAROSCOPIC;  Surgeon: Ovidio Kin, MD;  Location: [facility];  Service: General;  Laterality: N/A;   . tendon surgery on elbow    . TONSILLECTOMY      FAMILY HISTORY: Family History  Problem Relation Age of Onset  . Prostate cancer Father   . Heart disease Father 50    CABGs x 3  . Hypertension Father   . Hyperlipidemia Sister   . Hypertension Sister   . Renal cancer Sister   . Alcohol abuse Brother   . Diabetes Brother     PVD  . Hypertension Brother     SOCIAL HISTORY: Social History   Social History  . Marital status: Married    Spouse name: N/A  . Number of children: 2  . Years of education: college   Occupational History  . artist    Social History Main Topics  . Smoking status: Never Smoker  . Smokeless tobacco: Never Used  . Alcohol use 8.4 oz/week    14 Glasses of wine per week     Comment: daily wine  . Drug use: No  . Sexual activity: Yes   Other Topics Concern  . Not on file   Social History Narrative   Lives at home w/ her husband   Right-handed   Caffeine: weekly coffee and daily hot tea        PHYSICAL EXAM  Vitals:   07/27/16 1417  BP: 127/82  Pulse: 89   There is no height or weight on file to calculate BMI.  Generalized: Well developed, in no acute distress  Head: normocephalic and atraumatic,. Oropharynx benign  Neck: Supple, no carotid bruits  Cardiac: Regular rate rhythm, no murmur  Musculoskeletal: No deformity   Neurological examination   Mentation: Alert oriented to time, place, history taking. Attention span and concentration appropriate. Recent and remote memory intact.  Follows all commands speech and language fluent.   Cranial nerve II-XII: Pupils were equal round reactive to light extraocular movements were full, visual field were full on confrontational test. Facial sensation and strength were normal. hearing was intact to finger rubbing bilaterally. Uvula tongue midline. head turning and shoulder shrug were normal and symmetric.Tongue protrusion into cheek strength was normal. Motor: normal bulk and tone, full  strength in the BUE, BLE, fine finger movements normal, no pronator drift. No focal weakness Sensory: normal and symmetric to light touch, pinprick, and  Vibration, In the upper and lower extremities  Coordination: finger-nose-finger, heel-to-shin bilaterally, no dysmetria, no tremor Reflexes: Symmetric upper and lower, plantar responses were flexor bilaterally. Gait and Station: Rising up from seated position without assistance, normal stance,  moderate stride, good arm swing, smooth turning, able to perform tiptoe, and heel walking without difficulty. Tandem gait is steady  DIAGNOSTIC DATA (LABS, IMAGING, TESTING) -    Component Value Date/Time   NA 139 02/02/2016 1203   K 3.6 02/02/2016 1203   CL 105 02/02/2016 1203   CO2 25 02/02/2016 1203   GLUCOSE 107 (H) 02/02/2016 1203   BUN 16 02/02/2016 1203   CREATININE 0.93 02/02/2016 1203   CALCIUM 9.2 02/02/2016 1203   PROT 6.8 09/08/2014 1650  ALBUMIN 4.2 09/08/2014 1650   AST 24 09/08/2014 1650   ALT 21 09/08/2014 1650   ALKPHOS 97 09/08/2014 1650   BILITOT 0.4 09/08/2014 1650   GFRNONAA 71 (L) 07/30/2014 2110   GFRAA 82 (L) 07/30/2014 2110     ASSESSMENT AND PLAN  56 y.o. year old female  here to follow-up for classic migraine. She was placed on Topamax at her last visit and has been doing extremely well However  she is having side effects on the drug.and wants to titrate off  PLAN: Taper  Topamax by 25 mg weekly until off of the drug Continue Maxalt if necessary for acute migraine will refill Given list of migraine triggers Call if headaches worsen F/U in 3 months I spent 25 15 minutes in total face to face time with the patient more than 50% of which was spent counseling and coordination of care, reviewing test results reviewing medications and discussing and reviewing the diagnosis of migraine and migraine triggers. She was given a list. Importance of keeping a diary if headaches worsen to include the time of the headache  what you're doing any other specific information that would be useful. Discussed stress relief techniques such as deep breathing muscle relaxation mental relaxation to music. Discussed importance of exercise, regular meals  and sleep. Sleep deprivation can be a migraine trigger. Once she is off of the medication if the headaches worsen she can be prescribed a preventive over-the-phone.   Nilda Riggs, [facility], [facility], APRN  [facility] [address], [address] [place], [place] [address] [phone]

## 2016-07-27 NOTE — Patient Instructions (Addendum)
Taper  Topamax by 25 mg weekly until off of the drug Continue Maxalt if necessary for acute migraine Given list of migraine triggers Call if headaches worsen F/U in 3 months  Migraine Headache A migraine headache is an intense, throbbing pain on one side or both sides of the head. Migraines may also cause other symptoms, such as nausea, vomiting, and sensitivity to light and noise. What are the causes? Doing or taking certain things may also trigger migraines, such as:  Alcohol.  Smoking.  Medicines, such as:  Medicine used to treat chest pain (nitroglycerine).  Birth control pills.  Estrogen pills.  Certain blood pressure medicines.  Aged cheeses, chocolate, or caffeine.  Foods or drinks that contain nitrates, glutamate, aspartame, or tyramine.  Physical activity. Other things that may trigger a migraine include:  Menstruation.  Pregnancy.  Hunger.  Stress, lack of sleep, too much sleep, or fatigue.  Weather changes. What increases the risk? The following factors may make you more likely to experience migraine headaches:  Age. Risk increases with age.  Family history of migraine headaches.  Being Caucasian.  Depression and anxiety.  Obesity.  Being a woman.  Having a hole in the heart (patent foramen ovale) or other heart problems. What are the signs or symptoms? The main symptom of this condition is pulsating or throbbing pain. Pain may:  Happen in any area of the head, such as on one side or both sides.  Interfere with daily activities.  Get worse with physical activity.  Get worse with exposure to bright lights or loud noises. Other symptoms may include:  Nausea.  Vomiting.  Dizziness.  General sensitivity to bright lights, loud noises, or smells. Before you get a migraine, you may get warning signs that a migraine is developing (aura). An aura may include:  Seeing flashing lights or having blind spots.  Seeing bright spots, halos,  or zigzag lines.  Having tunnel vision or blurred vision.  Having numbness or a tingling feeling.  Having trouble talking.  Having muscle weakness. How is this diagnosed? A migraine headache can be diagnosed based on:  Your symptoms.  A physical exam.  Tests, such as CT scan or MRI of the head. These imaging tests can help rule out other causes of headaches.  Taking fluid from the spine (lumbar puncture) and analyzing it (cerebrospinal fluid analysis, or CSF analysis). How is this treated? A migraine headache is usually treated with medicines that:  Relieve pain.  Relieve nausea.  Prevent migraines from coming back. Treatment may also include:  Acupuncture.  Lifestyle changes like avoiding foods that trigger migraines. Follow these instructions at home: Medicines   Take over-the-counter and prescription medicines only as told by your health care provider.  Do not drive or use heavy machinery while taking prescription pain medicine.  To prevent or treat constipation while you are taking prescription pain medicine, your health care provider may recommend that you:  Drink enough fluid to keep your urine clear or pale yellow.  Take over-the-counter or prescription medicines.  Eat foods that are high in fiber, such as fresh fruits and vegetables, whole grains, and beans.  Limit foods that are high in fat and processed sugars, such as fried and sweet foods. Lifestyle   Avoid alcohol use.  Do not use any products that contain nicotine or tobacco, such as cigarettes and e-cigarettes. If you need help quitting, ask your health care provider.  Get at least 8 hours of sleep every night.  Limit your  stress. General instructions    Keep a journal to find out what may trigger your migraine headaches. For example, write down:  What you eat and drink.  How much sleep you get.  Any change to your diet or medicines.  If you have a migraine:  Avoid things that make  your symptoms worse, such as bright lights.  It may help to lie down in a dark, quiet room.  Do not drive or use heavy machinery.  Ask your health care provider what activities are safe for you while you are experiencing symptoms.  Keep all follow-up visits as told by your health care provider. This is important. Contact a health care provider if:  You develop symptoms that are different or more severe than your usual migraine symptoms. Get help right away if:  Your migraine becomes severe.  You have a fever.  You have a stiff neck.  You have vision loss.  Your muscles feel weak or like you cannot control them.  You start to lose your balance often.  You develop trouble walking.  You faint. This information is not intended to replace advice given to you by your health care provider. Make sure you discuss any questions you have with your health care provider. Document Released: 04/18/2005 Document Revised: 11/06/2015 Document Reviewed: 10/05/2015 Elsevier Interactive Patient Education  2017 [employer]

## 2016-07-28 NOTE — Progress Notes (Signed)
I have read the note, and I agree with the clinical assessment and plan.  [name] [name]   

## 2016-07-29 ENCOUNTER — Other Ambulatory Visit: Payer: Self-pay | Admitting: Internal Medicine

## 2016-08-01 ENCOUNTER — Other Ambulatory Visit: Payer: Self-pay | Admitting: Internal Medicine

## 2016-08-01 NOTE — Telephone Encounter (Signed)
Verbal order by Dr. Lenord Fellers to refill Effexor-XR .  Called refill to [facility] @ [phone], left voicemail.

## 2016-08-02 NOTE — Telephone Encounter (Signed)
Refilled by phone

## 2016-08-03 ENCOUNTER — Other Ambulatory Visit: Payer: Self-pay | Admitting: Neurology

## 2016-08-03 ENCOUNTER — Other Ambulatory Visit: Payer: Self-pay | Admitting: Nurse Practitioner

## 2016-09-12 ENCOUNTER — Telehealth: Payer: Self-pay | Admitting: Nurse Practitioner

## 2016-09-12 MED ORDER — TOPIRAMATE 25 MG PO TABS: 25.0000 mg | ORAL_TABLET | Freq: Every day | ORAL | 3 refills | Status: DC

## 2016-09-12 NOTE — Addendum Note (Signed)
Addended by: Lynder ParentsMARTIN, NANCY C on: 09/12/2016 04:31 PM   Modules accepted: Orders

## 2016-09-12 NOTE — Telephone Encounter (Signed)
Spoke to patient - reports return of migraines after tapering off topiramate.  She restarted the medication three weeks ago at 25mg , one tablet daily.  Feels her migraines are well controlled at this dose and the adverse side effects she was experiencing are now tolerable.  She is calling to update you on her health status and is requesting a prescription for topiramate 25mg , one tablet daily be sent to the pharmacy.

## 2016-09-12 NOTE — Telephone Encounter (Signed)
Patient called office in reference to topiramate (TOPAMAX) 25 MG tablet.  Patient came off of the medication 3 days then headaches came back patient states she will not be able to be off of the medication completely. Patient has been taking 25mg  1 tablet in the evening and she is doing fine and would like to continue with this dosage.

## 2016-09-27 ENCOUNTER — Other Ambulatory Visit: Payer: Self-pay | Admitting: Internal Medicine

## 2016-09-27 NOTE — Telephone Encounter (Signed)
Past due for recheck on BP. Last seen in October. Please call her and book 6 month followup appt before refilling.

## 2016-09-27 NOTE — Telephone Encounter (Signed)
Patient is out of town for the summer please advise on refills for the patient, no appointment made at this time.

## 2016-09-27 NOTE — Telephone Encounter (Signed)
LMTCB

## 2016-09-27 NOTE — Telephone Encounter (Signed)
Please advise when pt will be back in town and tell her she will need to get CPE at that time. Can refill through then ie August???

## 2016-09-28 ENCOUNTER — Ambulatory Visit: Payer: PRIVATE HEALTH INSURANCE | Admitting: Nurse Practitioner

## 2016-10-08 ENCOUNTER — Other Ambulatory Visit: Payer: Self-pay | Admitting: Internal Medicine

## 2016-12-04 ENCOUNTER — Other Ambulatory Visit: Payer: Self-pay | Admitting: Internal Medicine

## 2016-12-12 ENCOUNTER — Other Ambulatory Visit: Payer: PRIVATE HEALTH INSURANCE | Admitting: Internal Medicine

## 2016-12-12 DIAGNOSIS — Z1321 Encounter for screening for nutritional disorder: Secondary | ICD-10-CM

## 2016-12-12 DIAGNOSIS — Z0000 Encounter for general adult medical examination without abnormal findings: Secondary | ICD-10-CM

## 2016-12-12 DIAGNOSIS — I10 Essential (primary) hypertension: Secondary | ICD-10-CM

## 2016-12-12 DIAGNOSIS — Z1329 Encounter for screening for other suspected endocrine disorder: Secondary | ICD-10-CM

## 2016-12-12 DIAGNOSIS — E782 Mixed hyperlipidemia: Secondary | ICD-10-CM

## 2016-12-12 DIAGNOSIS — F419 Anxiety disorder, unspecified: Secondary | ICD-10-CM

## 2016-12-12 DIAGNOSIS — F39 Unspecified mood [affective] disorder: Secondary | ICD-10-CM

## 2016-12-12 LAB — COMPLETE METABOLIC PANEL WITH GFR
ALBUMIN: 4.1 g/dL (ref 3.6–5.1)
ALK PHOS: 104 U/L (ref 33–130)
ALT: 20 U/L (ref 6–29)
AST: 21 U/L (ref 10–35)
BILIRUBIN TOTAL: 0.4 mg/dL (ref 0.2–1.2)
BUN: 18 mg/dL (ref 7–25)
CO2: 23 mmol/L (ref 20–32)
Calcium: 9 mg/dL (ref 8.6–10.4)
Chloride: 107 mmol/L (ref 98–110)
Creat: 0.87 mg/dL (ref 0.50–1.05)
GFR, EST AFRICAN AMERICAN: 87 mL/min (ref >=60)
GFR, EST NON AFRICAN AMERICAN: 75 mL/min (ref >=60)
GLUCOSE: 102 mg/dL — AB (ref 65–99)
POTASSIUM: 3.9 mmol/L (ref 3.5–5.3)
SODIUM: 140 mmol/L (ref 135–146)
TOTAL PROTEIN: 6.4 g/dL (ref 6.1–8.1)

## 2016-12-12 LAB — CBC WITH DIFFERENTIAL/PLATELET
BASOS ABS: 51 {cells}/uL (ref 0–200)
Basophils Relative: 1 %
EOS ABS: 102 {cells}/uL (ref 15–500)
Eosinophils Relative: 2 %
HEMATOCRIT: 40 % (ref 35.0–45.0)
HEMOGLOBIN: 13.6 g/dL (ref 11.7–15.5)
LYMPHS ABS: 1326 {cells}/uL (ref 850–3900)
Lymphocytes Relative: 26 %
MCH: 31.3 pg (ref 27.0–33.0)
MCHC: 34 g/dL (ref 32.0–36.0)
MCV: 92 fL (ref 80.0–100.0)
MPV: 9.8 fL (ref 7.5–12.5)
Monocytes Absolute: 408 cells/uL (ref 200–950)
Monocytes Relative: 8 %
Neutro Abs: 3213 cells/uL (ref 1500–7800)
Neutrophils Relative %: 63 %
Platelets: 200 10*3/uL (ref 140–400)
RBC: 4.35 MIL/uL (ref 3.80–5.10)
RDW: 13.6 % (ref 11.0–15.0)
WBC: 5.1 10*3/uL (ref 3.8–10.8)

## 2016-12-12 LAB — LIPID PANEL
CHOL/HDL RATIO: 5 ratio — AB (ref <5.0)
Cholesterol: 269 mg/dL — ABNORMAL HIGH (ref <200)
HDL: 54 mg/dL (ref >50)
LDL Cholesterol: 145 mg/dL — ABNORMAL HIGH (ref <100)
TRIGLYCERIDES: 349 mg/dL — AB (ref <150)
VLDL: 70 mg/dL — ABNORMAL HIGH (ref <30)

## 2016-12-12 LAB — TSH: TSH: 5.9 mIU/L — ABNORMAL HIGH

## 2016-12-13 LAB — VITAMIN D 25 HYDROXY (VIT D DEFICIENCY, FRACTURES): Vit D, 25-Hydroxy: 31 ng/mL (ref 30–100)

## 2016-12-15 ENCOUNTER — Encounter: Payer: PRIVATE HEALTH INSURANCE | Admitting: Internal Medicine

## 2016-12-16 ENCOUNTER — Encounter: Payer: Self-pay | Admitting: Internal Medicine

## 2016-12-16 ENCOUNTER — Ambulatory Visit (INDEPENDENT_AMBULATORY_CARE_PROVIDER_SITE_OTHER): Payer: PRIVATE HEALTH INSURANCE | Admitting: Internal Medicine

## 2016-12-16 ENCOUNTER — Other Ambulatory Visit: Payer: Self-pay | Admitting: Internal Medicine

## 2016-12-16 VITALS — BP 98/60 | HR 101 | Temp 98.2°F

## 2016-12-16 DIAGNOSIS — Z8669 Personal history of other diseases of the nervous system and sense organs: Secondary | ICD-10-CM

## 2016-12-16 DIAGNOSIS — J0100 Acute maxillary sinusitis, unspecified: Secondary | ICD-10-CM | POA: Diagnosis not present

## 2016-12-16 DIAGNOSIS — E039 Hypothyroidism, unspecified: Secondary | ICD-10-CM

## 2016-12-16 DIAGNOSIS — I10 Essential (primary) hypertension: Secondary | ICD-10-CM

## 2016-12-16 DIAGNOSIS — F419 Anxiety disorder, unspecified: Secondary | ICD-10-CM

## 2016-12-16 DIAGNOSIS — Z0000 Encounter for general adult medical examination without abnormal findings: Secondary | ICD-10-CM

## 2016-12-16 DIAGNOSIS — E7800 Pure hypercholesterolemia, unspecified: Secondary | ICD-10-CM | POA: Diagnosis not present

## 2016-12-16 LAB — POCT URINALYSIS DIPSTICK
Bilirubin, UA: NEGATIVE
Blood, UA: NEGATIVE
Glucose, UA: NEGATIVE
Ketones, UA: NEGATIVE
LEUKOCYTES UA: NEGATIVE
NITRITE UA: NEGATIVE
PH UA: 6 (ref 5.0–8.0)
PROTEIN UA: NEGATIVE
Spec Grav, UA: 1.01 (ref 1.010–1.025)
Urobilinogen, UA: 0.2 E.U./dL

## 2016-12-16 MED ORDER — AZITHROMYCIN 250 MG PO TABS: ORAL_TABLET | ORAL | 0 refills | Status: DC

## 2016-12-16 MED ORDER — LEVOTHYROXINE SODIUM 50 MCG PO TABS: 50.0000 ug | ORAL_TABLET | Freq: Every day | ORAL | 0 refills | Status: DC

## 2016-12-16 NOTE — Telephone Encounter (Signed)
90 days NOT appropriate please call them

## 2016-12-30 DIAGNOSIS — E039 Hypothyroidism, unspecified: Secondary | ICD-10-CM | POA: Insufficient documentation

## 2016-12-30 NOTE — Progress Notes (Signed)
Subjective:    Patient ID: [name] [name], female    DOB: Aug 23, 1960, 56 y.o.   MRN: [id number]  HPI 56 year old white female with history of hypertension in today for health maintenance exam. In the Fall of 2017, she been treated for diverticulitis by Dr. Ewing Schlein. From time to time has some vague abdominal pain but workup has been negative.  History of hyperlipidemia and is statin intolerant. She saw cardiologist who told her not toward about it since her calcium score was low.  With regard to hypertension, blood pressure is stable. In fact it is low today but she played tennis earlier in the day and just came from the hairdresser. She says it generally doesn't run quite this low.  I had a talk with her today about not wanting to come in for appointments. [place] and was not home all that much but and candid her she had to come in for we would not continue to refill her medications.  Says she has history of social anxiety disorder. She's been on Effexor for mood stabilization and to help with migraine headaches. Has history of anxiety depression. At last physical exam in fall 2017 she wanted to come off of Effexor. We came up with a plan to taper her off. However she still on it.  For migraine headaches treated by Dr. Anne Hahn she takes Topamax and uses Maxalt.Marland Kitchen History of hypothyroidism treated with levothyroxine 0.05 mg daily. Takes Maxalt a when necessary basis for anxiety.  His come down with a respiratory infection will be treated with a Z-Pak.  Hypertension treated with losartan HCTZ 100/25 and amlodipine 5 mg daily. She takes potassium 10 mEq daily.  Social history: She is married. Husband is a Marine scientist. 2 adult daughters. Does not smoke. Social alcohol consumption. She plays a lot of tennis.. Formerly smoked 6 years all benign in high school and college. 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  She is watched for minimal aneurysmal dilatation  of the ascending thoracic aorta. Recent study in November 2017 showed no significant change in size.  Calcium scoring on CT cardiac scoring was 0 in November 2017.  She had an appendectomy by Dr. Ezzard Standing in March 2016  Pap done in 2017-says she's had a hysterectomy in the past. History of tonsillectomy and tendon surgery on elbow.  In 2016 Dr. Ezzard Standing detected multiple nonspecific lesions and the liver. Dynamic liver protocol MRI was done showing these to be benign. History of small kidney stone noted lower pole of left kidney in March 2016 on CT when she had acute appendicitis  Colonoscopy done a Dr. Ewing Schlein in 2015 showing diverticula, internal and external hemorrhoids  In 2014 she was diagnosed with sarcoidosis by pulmonary physician. 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 There was no evidence of primary pulmonary airway process. Pulmonologist did not think systemic steroids were reasonable.  Intolerant of some morphine-type drugs which cause itching.  In 2005 she had an elbow injury. C-sections in 1992 and 1993. She has had bilateral tubal ligation and abdominoplasty 1994. Benign breast biopsy 1993.  In May 2015 she had an episode of double vision and was sent for MRI of the brain with and without gadolinium ordered by Dr. Anne Hahn  Hospitalized for Campylobacter diarrhea in 2001.  Family history: History of father in his 55s having heart disease with history of hypertension but died due to metastatic prostate cancer. Sister with history of hypertension hyperlipidemia who  passed away from renal cancer. Brother with history of alcohol abuse, diabetes and hypertension. 2 brothers in good health.    Review of Systems see above having headache and maxillary sinus pressure     Objective:   Physical Exam  Constitutional: She is oriented to person, place, and time. She appears well-developed and well-nourished. No distress.  HENT:  Head: Normocephalic and  atraumatic.  Right Ear: External ear normal.  Left Ear: External ear normal.  Mouth/Throat: Oropharynx is clear and moist. No oropharyngeal exudate.  Eyes: Pupils are equal, round, and reactive to light. Conjunctivae and EOM are normal. Right eye exhibits no discharge. Left eye exhibits no discharge. No scleral icterus.  Neck: Neck supple. No JVD present. No thyromegaly present.  Cardiovascular: Normal rate, regular rhythm and normal heart sounds.   No murmur heard. Pulmonary/Chest: Effort normal and breath sounds normal. No respiratory distress. She has no wheezes. She has no rales.  Breasts normal female  Abdominal: Soft. Bowel sounds are normal. She exhibits no distension and no mass. There is no tenderness. There is no rebound and no guarding.  Musculoskeletal: She exhibits no edema.  Lymphadenopathy:    She has no cervical adenopathy.  Neurological: She is alert and oriented to person, place, and time. She has normal reflexes. No cranial nerve deficit. Coordination normal.  Skin: Skin is warm and dry. No rash noted. She is not diaphoretic.  Psychiatric: She has a normal mood and affect. Her behavior is normal. Judgment and thought content normal.  Vitals reviewed.         Assessment & Plan:  Acute maxillary sinusitis-treat with Zithromax  Migraine headaches treated by neurologist  Essential hypertension  History of anxiety and social anxiety disorder as well as depression treated with Effexor  History of vague abdominal pain etiology unclear  Hyperlipidemia-intolerant of statins and had low cardiac calcium score so cardiologist was not to worried about her not taking lipid-lowering medication  Hypothyroidism-TSH is 5.90 in 3 years ago was 3.430. She will be started on thyroid replacement and follow-up in September  Plan: Return in September for follow-up on hypothyroidism and other issues

## 2016-12-30 NOTE — Patient Instructions (Signed)
Return in 6 weeks for follow-up on hypothyroidism. Takes Zithromax Z-PAK for sinusitis. Continue antihypertensive medications.

## 2016-12-31 ENCOUNTER — Other Ambulatory Visit: Payer: Self-pay | Admitting: Internal Medicine

## 2017-01-01 ENCOUNTER — Other Ambulatory Visit: Payer: Self-pay | Admitting: Internal Medicine

## 2017-01-02 ENCOUNTER — Other Ambulatory Visit: Payer: Self-pay | Admitting: Internal Medicine

## 2017-01-03 NOTE — Progress Notes (Signed)
[facility]  PATIENT: [name] EhrlichJulie Paradise [name] DOB: 11/10/1960   REASON FOR VISIT: Follow-up for headaches HISTORY FROM: Patient    HISTORY OF PRESENT ILLNESS:UPDATE 09/05/2018CM Ms [name] [name], 56 year old female returns for follow-up with history of classic migraine. She was on Topamax 75 mg when last seen and wanted to taper off the medication due to lethargy and metallic taste and word searching problems.  She called back 2 months later and wanted to go back on a lower dose. She is currently on Topamax 25 mg daily without side effects. She has not had a headache in several months. Red wine is a migraine trigger. She returns for reevaluation   UPDATE 3/28/18CM  [name] [name], 56 year old female returns for follow-up. She has a history of headaches/classic migraine. She was placed on Topamax in August of last year by  Dr. Anne HahnWillis and her migraines are doing extremely well however her appointment today due to side effects of the drug such as lethargy metallic taste word searching problems numbness and tingling. She has not had to take Maxalt. She denies any migraines in the last 4 months Patient wants to be titrated off the medication to see how she does, she may need a preventive in the future. She says her only migraine triggers are red wine. She returns for reevaluation   HISTORY 12/28/15 KWMs. [name] [name] is a 56 year old right-handed white female with a history of classic migraine headaches. Her usual headaches occur once or twice a week and are associated with blurring out of the visual fields, onset of a headache that is generally fairly mild. The patient usually does not take any medication at all for her migraine. Within the last 3 weeks, her headaches have changed somewhat. Her headaches are now mainly behind the right eye, sometimes spreading to behind the left eye. She may have some discomfort in the top and back of the head, and she may have some neck stiffness. The patient will have  photophobia and phonophobia, sensitivity to odors. She will have some nausea, she has not had any vomiting. The patient denies any numbness or weakness of the extremities. She does feel cloudy headed with the headache and has difficulty with concentration. The headaches may go on for several days. She has had 4 headaches over the last 3 weeks. She comes to this office for an evaluation. When the headache is severe, it is disabling, she is not able to function.   REVIEW OF SYSTEMS: Full 14 system review of systems performed and notable only for those listed, all others are neg:  Constitutional: neg  Cardiovascular: neg Ear/Nose/Throat: neg  Skin: neg Eyes: neg Respiratory: neg Gastroitestinal: neg  Hematology/Lymphatic: neg  Endocrine: neg Musculoskeletal:neg Allergy/Immunology: neg Neurological: neg Psychiatric: neg Sleep : neg   ALLERGIES: Allergies  Allergen Reactions  . Ramipril Cough  . Sulfa Antibiotics Other (See Comments)    Itching.    HOME MEDICATIONS: Outpatient Medications Prior to Visit  Medication Sig Dispense Refill  . ALPRAZolam (XANAX) 0.25 MG tablet Take 1 tablet (0.25 mg total) by mouth 2 (two) times daily as needed for anxiety. 30 tablet 0  . amLODipine (NORVASC) 5 MG tablet TAKE 1 TABLET(5 MG) BY MOUTH DAILY 90 tablet 0  . levothyroxine (SYNTHROID, LEVOTHROID) 50 MCG tablet Take 1 tablet (50 mcg total) by mouth daily. 60 tablet 0  . losartan-hydrochlorothiazide (HYZAAR) 100-25 MG tablet TAKE 1 TABLET BY MOUTH DAILY 90 tablet 0  . potassium chloride (K-DUR) 10 MEQ tablet TAKE 1 TABLET(10 MEQ)  BY MOUTH DAILY 90 tablet 0  . rizatriptan (MAXALT-MLT) 10 MG disintegrating tablet Take 1 tablet (10 mg total) by mouth 3 (three) times daily as needed for migraine. 9 tablet 3  . topiramate (TOPAMAX) 25 MG tablet Take 1 tablet (25 mg total) by mouth daily. 90 tablet 3  . venlafaxine XR (EFFEXOR-XR) 75 MG 24 hr capsule TAKE 1 CAPSULE(75 MG) BY MOUTH DAILY WITH BREAKFAST  90 capsule 0  . amLODipine (NORVASC) 5 MG tablet TAKE 1 TABLET(5 MG) BY MOUTH DAILY 90 tablet 0  . azithromycin (ZITHROMAX) 250 MG tablet Take 2 tablets on day 1 and 1 tablet days 2-5 6 tablet 0   No facility-administered medications prior to visit.     PAST MEDICAL HISTORY: Past Medical History:  Diagnosis Date  . Classic migraine 09/18/2013  . Diplopia 09/18/2013  . Diverticulitis   . Generalized headaches   . Hyperlipidemia   . Hypertension     PAST SURGICAL HISTORY: Past Surgical History:  Procedure Laterality Date  . ABDOMINAL HYSTERECTOMY    . CESAREAN SECTION     x 2  . LAPAROSCOPIC APPENDECTOMY N/A 07/31/2014   Procedure: APPENDECTOMY LAPAROSCOPIC;  Surgeon: Ovidio Kin, MD;  Location: [facility];  Service: General;  Laterality: N/A;  . tendon surgery on elbow    . TONSILLECTOMY      FAMILY HISTORY: Family History  Problem Relation Age of Onset  . Prostate cancer Father   . Heart disease Father 50       CABGs x 3  . Hypertension Father   . Hyperlipidemia Sister   . Hypertension Sister   . Renal cancer Sister   . Alcohol abuse Brother   . Diabetes Brother        PVD  . Hypertension Brother     SOCIAL HISTORY: Social History   Social History  . Marital status: Married    Spouse name: N/A  . Number of children: 2  . Years of education: college   Occupational History  . artist    Social History Main Topics  . Smoking status: Never Smoker  . Smokeless tobacco: Never Used  . Alcohol use 8.4 oz/week    14 Glasses of wine per week     Comment: daily wine  . Drug use: No  . Sexual activity: Yes   Other Topics Concern  . Not on file   Social History Narrative   Lives at home w/ her husband   Right-handed   Caffeine: weekly coffee and daily hot tea        PHYSICAL EXAM  Vitals:   01/04/17 0907  BP: 104/68  Pulse: 68   There is no height or weight on file to calculate BMI.  Generalized: Well developed, in no acute distress  Head:  normocephalic and atraumatic,. Oropharynx benign  Neck: Supple,  Musculoskeletal: No deformity   Neurological examination   Mentation: Alert oriented to time, place, history taking. Attention span and concentration appropriate. Recent and remote memory intact.  Follows all commands speech and language fluent.   Cranial nerve II-XII: Pupils were equal round reactive to light extraocular movements were full, visual field were full on confrontational test. Facial sensation and strength were normal. hearing was intact to finger rubbing bilaterally. Uvula tongue midline. head turning and shoulder shrug were normal and symmetric.Tongue protrusion into cheek strength was normal. Motor: normal bulk and tone, full strength in the BUE, BLE, fine finger movements normal, no pronator drift.  Sensory: normal and symmetric to  light touch, pinprick, and  Vibration, In the upper and lower extremities  Coordination: finger-nose-finger, heel-to-shin bilaterally, no dysmetria, no tremor Reflexes: Symmetric upper and lower, plantar responses were flexor bilaterally. Gait and Station: Rising up from seated position without assistance, normal stance,  moderate stride, good arm swing, smooth turning, able to perform tiptoe, and heel walking without difficulty. Tandem gait is steady  DIAGNOSTIC DATA (LABS, IMAGING, TESTING) -    Component Value Date/Time   NA 140 12/12/2016 1006   K 3.9 12/12/2016 1006   CL 107 12/12/2016 1006   CO2 23 12/12/2016 1006   GLUCOSE 102 (H) 12/12/2016 1006   BUN 18 12/12/2016 1006   CREATININE 0.87 12/12/2016 1006   CALCIUM 9.0 12/12/2016 1006   PROT 6.4 12/12/2016 1006   ALBUMIN 4.1 12/12/2016 1006   AST 21 12/12/2016 1006   ALT 20 12/12/2016 1006   ALKPHOS 104 12/12/2016 1006   BILITOT 0.4 12/12/2016 1006   GFRNONAA 75 12/12/2016 1006   GFRAA 87 12/12/2016 1006     ASSESSMENT AND PLAN  56 y.o. year old female  here to follow-up for classic migraine. She had side effects   on Topamax at her last visit and tapered off. Restarted at low dose 2 months later due to recurrence of headaches .  PLAN: Continue  Topamax by 25 mg daily does not need refills Continue Maxalt if necessary for acute migraine  Avoid migraine triggers Call if headaches worsen F/U yearly I spent 15  minutes in total face to face time with the patient more than 50% of which was spent counseling and coordination of care,  reviewing medications and discussing and reviewing the diagnosis of migraine and migraine triggers.  Nilda Riggs, [facility], [facility], APRN  [facility] [address] [place], [place] [address] [phone]

## 2017-01-04 ENCOUNTER — Ambulatory Visit (INDEPENDENT_AMBULATORY_CARE_PROVIDER_SITE_OTHER): Payer: PRIVATE HEALTH INSURANCE | Admitting: Nurse Practitioner

## 2017-01-04 ENCOUNTER — Encounter: Payer: Self-pay | Admitting: Nurse Practitioner

## 2017-01-04 VITALS — BP 104/68 | HR 68

## 2017-01-04 DIAGNOSIS — G43109 Migraine with aura, not intractable, without status migrainosus: Secondary | ICD-10-CM

## 2017-01-04 NOTE — Progress Notes (Signed)
I have read the note, and I agree with the clinical assessment and plan.  Tonnia Bardin KEITH   

## 2017-01-04 NOTE — Patient Instructions (Signed)
Continue  Topamax by 25 mg daily Continue Maxalt if necessary for acute migraine  Avoid migraine triggers Call if headaches worsen F/U yearly

## 2017-01-23 ENCOUNTER — Other Ambulatory Visit: Payer: PRIVATE HEALTH INSURANCE | Admitting: Internal Medicine

## 2017-01-23 DIAGNOSIS — E039 Hypothyroidism, unspecified: Secondary | ICD-10-CM

## 2017-01-23 LAB — TSH: TSH: 2.9 mIU/L

## 2017-01-23 LAB — EXTRA LAV TOP TUBE

## 2017-01-24 ENCOUNTER — Ambulatory Visit (INDEPENDENT_AMBULATORY_CARE_PROVIDER_SITE_OTHER): Payer: PRIVATE HEALTH INSURANCE | Admitting: Internal Medicine

## 2017-01-24 ENCOUNTER — Encounter: Payer: Self-pay | Admitting: Internal Medicine

## 2017-01-24 VITALS — BP 124/80 | HR 81 | Temp 97.6°F | Wt 176.0 lb

## 2017-01-24 DIAGNOSIS — E039 Hypothyroidism, unspecified: Secondary | ICD-10-CM

## 2017-01-24 DIAGNOSIS — I10 Essential (primary) hypertension: Secondary | ICD-10-CM | POA: Diagnosis not present

## 2017-01-24 NOTE — Progress Notes (Signed)
   Subjective:    Patient ID: [name] [name], female    DOB: January 30, 1961, 56 y.o.   MRN: [id number]  HPI At last visit was started on levothyroxine 0.05 mg daily for hypothyroidism. This was because TSH was elevated at 5.90 in August and previously had been 3.430  3 years previously. TSH is now 2.90. Patient doesn't feel any differently.  She also has essential hypertension blood pressure is excellent 124/80.    Review of Systems see above     Objective:   Physical Exam  No thyromegaly.      Assessment & Plan:  Hypothyroidism-patient currently with normal TSH on levothyroxine 0.05 mg daily.  Essential hypertension-stable on current regimen  Return in 6 months for office visit and blood pressure check.  Physical exam done August 2018.

## 2017-01-24 NOTE — Patient Instructions (Signed)
Continue same medications as previously prescribed and return in 6 months for office visit, TSH and blood pressure check

## 2017-02-09 ENCOUNTER — Other Ambulatory Visit: Payer: Self-pay | Admitting: Internal Medicine

## 2017-03-01 ENCOUNTER — Encounter: Payer: Self-pay | Admitting: Certified Nurse Midwife

## 2017-03-01 ENCOUNTER — Ambulatory Visit (INDEPENDENT_AMBULATORY_CARE_PROVIDER_SITE_OTHER): Payer: PRIVATE HEALTH INSURANCE | Admitting: Certified Nurse Midwife

## 2017-03-01 VITALS — BP 104/70 | HR 68 | Resp 16

## 2017-03-01 DIAGNOSIS — N951 Menopausal and female climacteric states: Secondary | ICD-10-CM

## 2017-03-01 NOTE — Patient Instructions (Signed)

## 2017-03-01 NOTE — Progress Notes (Signed)
56 y.o. [id number] Married  Caucasian Fe here to establish gyn care, here for consult only. ? Menopausal symptoms and if she needs to start on HRT. She feels she went through menopause 2-3 years ago, but was having no periods due to hysterectomy for bleeding. Sees Alfredia ClientMary Jo Baxley for aex, hypertension/migraine headache/hypothyroid, labs. Has been on  Effexor for the past 3 + years and stopped Effexor 2 weeks ago, after weaning off. Feels hot flashes have returned at times since stopping. She acknowledges they are more sporadic and infrequent that what she had experienced before. "I am I have menopause again"?  Patient had hysterectomy for menorrhagia in 1993. Patient has history of severe migraine with aura and numbness with. Headaches have been very severe until 2 years ago and became less severe and frequent, she feels this was her menopausal period of time. Was encouraged go seek HRT by friends, but not sure she needs anything at this point. She also has recently had abnormal TSH finding and started on Synthroid for hypothyroid with Dr Lenord FellersBaxley. She has aex with her, breast and pelvic exam. Vaginal pap smear 2017 negative. No health issues today, just wanted to understand menopause better and determine my menopausal status. Occasional vaginal dryness, but using OTC product with no issues. No other health issues today.  No LMP recorded. Patient has had a hysterectomy.          Sexually active: Yes.    The current method of family planning is status post hysterectomy.    Exercising: Yes.    many things Smoker:  no  Health Maintenance: Pap:  01-07-16 neg History of Abnormal Pap: yes MMG:  2018 bilateral & left breast u/s birads 3:prob benign Self Breast exams: no Colonoscopy:  2015 f/u 7840yrs BMD:   none TDaP: UTD Shingles: no Pneumonia: no Hep C and HIV: Hep c neg 2016 Labs: if needed   reports that she has never smoked. She has never used smokeless tobacco. She reports that she drinks about 10.8 oz of  alcohol per week . She reports that she does not use drugs.  Past Medical History:  Diagnosis Date  . Abnormal Pap smear of cervix    1331yrs ago  . Classic migraine 09/18/2013  . Diplopia 09/18/2013  . Diverticulitis   . Generalized headaches   . Hyperlipidemia   . Hypertension     Past Surgical History:  Procedure Laterality Date  . ABDOMINAL HYSTERECTOMY    . BREAST SURGERY    . CERVICAL CONE BIOPSY     5431yrs ago  . CESAREAN SECTION     x 2  . LAPAROSCOPIC APPENDECTOMY N/A 07/31/2014   Procedure: APPENDECTOMY LAPAROSCOPIC;  Surgeon: Ovidio Kinavid Newman, MD;  Location: [facility];  Service: General;  Laterality: N/A;  . tendon surgery on elbow    . TONSILLECTOMY    . TUBAL LIGATION      Current Outpatient Prescriptions  Medication Sig Dispense Refill  . ALPRAZolam (XANAX) 0.25 MG tablet Take 1 tablet (0.25 mg total) by mouth 2 (two) times daily as needed for anxiety. 30 tablet 0  . amLODipine (NORVASC) 5 MG tablet TAKE 1 TABLET(5 MG) BY MOUTH DAILY 90 tablet 0  . amoxicillin-clavulanate (AUGMENTIN) 500-125 MG tablet     . levothyroxine (SYNTHROID, LEVOTHROID) 50 MCG tablet TAKE 1 TABLET(50 MCG) BY MOUTH DAILY 90 tablet 0  . losartan-hydrochlorothiazide (HYZAAR) 100-25 MG tablet TAKE 1 TABLET BY MOUTH DAILY 90 tablet 0  . potassium chloride (K-DUR) 10 MEQ tablet TAKE  1 TABLET(10 MEQ) BY MOUTH DAILY 90 tablet 0  . topiramate (TOPAMAX) 25 MG tablet Take 1 tablet (25 mg total) by mouth daily. 90 tablet 3   No current facility-administered medications for this visit.     Family History  Problem Relation Age of Onset  . Prostate cancer Father   . Heart disease Father 50       CABGs x 3  . Hypertension Father   . Hyperlipidemia Sister   . Hypertension Sister   . Renal cancer Sister   . Alcohol abuse Brother   . Diabetes Brother        PVD  . Hypertension Brother     ROS:  Pertinent items are noted in HPI.  Otherwise, a comprehensive ROS was negative.  Exam:   BP 104/70   Pulse  68   Resp 16    Ht Readings from Last 3 Encounters:  04/13/16 5\' 5"  (1.651 m)  02/22/16 5\' 5"  (1.651 m)  12/28/15 5\' 5"  (1.651 m)    General appearance: alert, cooperative and appears stated age Affect: normal, WDWN No exam done   A: Consult for menopausal information only Surgical history of TAH for menorrhagia 1993 Menopausal symptoms 2015-2017? Effexor use for anxiety/depression 2015? To ?7/ 2018 and ? Menopausal symptoms, now has weaned off Recent diagnosis of Hypothyroid with Synthroid use Per chart normal exam with PCP 12/16/16 [facility] 02/11/13: 11.9  P:  Discussed HRT use for menopausal symptom relief and  With reviewing her chart information, she would not be a candidate for HRT. Discussed feel she may be experiencing vasomotor symptoms from recent diagnosis of hypothyroid and stopping Effexor use. Patient agrees that this all started again in conjunction with the recent diagnosis. Offered [facility] lab to determine if menopausal range, so she can feel confident she is menopausal. Discussed options for vaginal dryness if occurring . Questions addressed at length. Patient thank ful for Menopausal information and given booklet on Menopause also. Lab [facility] Continue follow up with Dr.Baxley.  RV prn  40 minutes face to face time spent regarding menopause and expectations  An After Visit Summary was printed and given to the patient.

## 2017-03-02 LAB — FOLLICLE STIMULATING HORMONE: FSH: 50.1 m[IU]/mL

## 2017-04-03 NOTE — Telephone Encounter (Signed)
Another discontinued.  Thanks.

## 2017-04-04 ENCOUNTER — Other Ambulatory Visit: Payer: Self-pay | Admitting: Internal Medicine

## 2017-04-04 NOTE — Telephone Encounter (Signed)
Needs 6 month recheck March with lipid panel and TSH. OK to refill until then after appt booked.

## 2017-04-04 NOTE — Telephone Encounter (Signed)
Called patient to book appointment.  She's driving and needs to get home to her calendar.  She'll call me back after 2:00 pm this afternoon.

## 2017-04-05 NOTE — Telephone Encounter (Signed)
Made appointment for patient in March.  Called in refills for Potassium CL 10MEQ ER and Losartan/HCTZ #90 for both, no refills.  [facility] [address]

## 2017-04-17 ENCOUNTER — Ambulatory Visit (INDEPENDENT_AMBULATORY_CARE_PROVIDER_SITE_OTHER): Payer: PRIVATE HEALTH INSURANCE | Admitting: Internal Medicine

## 2017-04-17 ENCOUNTER — Encounter: Payer: Self-pay | Admitting: Internal Medicine

## 2017-04-17 ENCOUNTER — Ambulatory Visit
Admission: RE | Admit: 2017-04-17 | Discharge: 2017-04-17 | Disposition: A | Discharge status: Home / Self Care | Payer: PRIVATE HEALTH INSURANCE | Source: Ambulatory Visit | Attending: Internal Medicine | Admitting: Internal Medicine

## 2017-04-17 VITALS — BP 110/90 | HR 74 | Temp 98.0°F | Wt 179.0 lb

## 2017-04-17 DIAGNOSIS — R059 Cough, unspecified: Secondary | ICD-10-CM

## 2017-04-17 DIAGNOSIS — I10 Essential (primary) hypertension: Secondary | ICD-10-CM

## 2017-04-17 DIAGNOSIS — R05 Cough: Secondary | ICD-10-CM

## 2017-04-17 MED ORDER — DOXYCYCLINE HYCLATE 100 MG PO TABS: 100.0000 mg | ORAL_TABLET | Freq: Two times a day (BID) | ORAL | 0 refills | Status: DC

## 2017-04-17 MED ORDER — ALBUTEROL SULFATE HFA 108 (90 BASE) MCG/ACT IN AERS: 2.0000 | INHALATION_SPRAY | Freq: Four times a day (QID) | RESPIRATORY_TRACT | 0 refills | Status: DC | PRN

## 2017-04-17 MED ORDER — FLUCONAZOLE 150 MG PO TABS: 150.0000 mg | ORAL_TABLET | Freq: Once | ORAL | 1 refills | Status: AC

## 2017-04-17 MED ORDER — METHYLPREDNISOLONE ACETATE 80 MG/ML IJ SUSP
80.0000 mg | Freq: Once | INTRAMUSCULAR | Status: AC
  Administered 2017-04-17: 80 mg via INTRAMUSCULAR

## 2017-04-17 NOTE — Progress Notes (Signed)
   Subjective:    Patient ID: [name] [name], female    DOB: 06/15/1960, 56 y.o.   MRN: [id number]  HPI 56 year old Female had onset of cough early Fall when jogging.  Cough has persisted.  Says at one point in 2014 she had a cough at the same time she was diagnosed with sarcoidosis and a left axillary lymph node.  She saw Dr. Danise MinaPat Wright at the time.  She subsequently had pulmonary functions which were normal.  Chest x-ray showed no active disease.  She had a CT of the chest that showed no mediastinal hilar or axillary lymphadenopathy.  She does have hypertension and is on losartan HCTZ    Review of Systems no fever or chills.  No night sweats.     Objective:   Physical Exam Skin warm and dry.  Nodes none.  Neck is supple.  Chest clear to auscultation.  Cardiac exam regular rate and rhythm.       Assessment & Plan:  Cough which sounds like it has been protracted for a number of weeks  Plan: She is going to try doxycycline 100 mg twice daily for 10 days and an albuterol inhaler.  She will call if symptoms persist.  She will have chest x-ray.  Addendum: Chest x-ray is negative

## 2017-04-25 IMAGING — NM NM HEPATO W/GB/PHARM/[PERSON_NAME]
2 series · 12 of 12 positions shown · non-contrast
Comparison: None.

CLINICAL DATA: Right upper quadrant abdominal pain

EXAM:
NUCLEAR MEDICINE HEPATOBILIARY IMAGING WITH GALLBLADDER EF
TECHNIQUE: Sequential images of the abdomen were obtained [DATE] minutes
following intravenous administration of radiopharmaceutical. After
oral ingestion of Ensure, gallbladder ejection fraction was
determined. At 60 min, normal ejection fraction is greater than 33%.
RADIOPHARMACEUTICALS:  5.46 mCi 5c-GGm  Choletec IV

[Series 1: biliary · 4.14mm/px · 6 of 60 frames shown]
[frame 6/60]
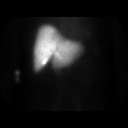
[frame 16/60]
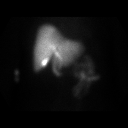
[frame 26/60]
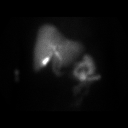
[frame 36/60]
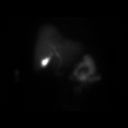
[frame 46/60]
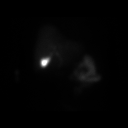
[frame 56/60]
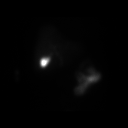

[Series 3: gbef · 4.14mm/px · 6 of 60 frames shown]
[frame 6/60]
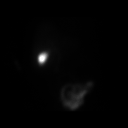
[frame 16/60]
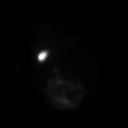
[frame 26/60]
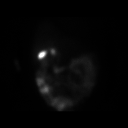
[frame 36/60]
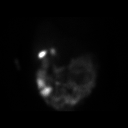
[frame 46/60]
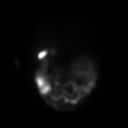
[frame 56/60]
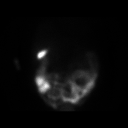

[12 of 12 positions shown; findings below may reference images not displayed]

FINDINGS: Prompt uptake and biliary excretion of activity by the liver is
seen. Gallbladder activity is visualized, consistent with patency of
cystic duct. Biliary activity passes into small bowel, consistent
with patent common bile duct.

Calculated gallbladder ejection fraction is 79%. (Normal gallbladder
ejection fraction with Ensure is greater than 33%.)
IMPRESSION: 1. Patent cystic duct without evidence for acute cholecystitis.
2. Normal gallbladder ejection fraction.

## 2017-04-28 ENCOUNTER — Other Ambulatory Visit: Payer: Self-pay | Admitting: Internal Medicine

## 2017-05-01 NOTE — Patient Instructions (Signed)
Have chest x-ray.  Take doxycycline 100 mg twice daily for 10 days.  Use albuterol inhaler.  Call if not better in 2 weeks or sooner if worse.

## 2017-05-03 ENCOUNTER — Telehealth: Payer: Self-pay | Admitting: Internal Medicine

## 2017-05-03 NOTE — Telephone Encounter (Signed)
Stop losartan. Can take 2-3 weeks to see a difference after stopping. If not getting better, will need to see Pulmonary again, Needs to continue Norvasc(amlodipine) and monitor BP. Call with readings after 5 days

## 2017-05-03 NOTE — Telephone Encounter (Signed)
Her cough is not better.  Dry cough, pressure in her chest.  Chest hurts, lungs hurt.  She has decided that it is the Losartan that she is on.  Says that she can feel it all the way through to her back.  She has stopped the Losartan now x 1 day.  She is just wondering why the side effects took so long to get here?  She has been on the Losartan almost 2 years.  She went on a website and was reading side effects of Losartan.  She stopped it yesterday.    Best # for contact:  [phone]

## 2017-05-03 NOTE — Telephone Encounter (Signed)
Spoke with patient and advised.  She wants to know what your opinion would be of her just being on a Rx of HCTZ in addition to the Amlodipine.  Says that years ago she was on HCTZ before she started the Losartan.  And, she knew that HCTZ was a combination of the Losartan.  However, states that she knows that you're the doctor, this was just a thought that she had and wanted to run it by you to see what you thought of it.  She definitely will monitor her BP and call us about Tuesday of next week with a report.

## 2017-05-04 ENCOUNTER — Other Ambulatory Visit: Payer: Self-pay | Admitting: Internal Medicine

## 2017-05-04 MED ORDER — HYDROCHLOROTHIAZIDE 25 MG PO TABS: 25.0000 mg | ORAL_TABLET | Freq: Every day | ORAL | 0 refills | Status: DC

## 2017-05-04 NOTE — Telephone Encounter (Signed)
Spoke with patient and advised of message.  She will start the HCTZ; however, she wants you to recall that she does take 10 mEq of Potassium daily.  Advised that we still need to f/u on her BP and also make sure that she is not spilling too much potassium since she will be on a straight diuretic.  She will f/u on Tuesday, 05/23/17 @ 10:45 and have K+ drawn at the time of the OV.  Patient will continue to monitor her BP readings as well.    Sending this to Araceli to send Rx to patients pharmacy.

## 2017-05-04 NOTE — Telephone Encounter (Signed)
No- do not give 90 day supply she has follow up appt soon

## 2017-05-04 NOTE — Telephone Encounter (Signed)
If we start HCTZ she needs follow up re potassium in 2 weeks. Please add HCTZ to her regimen 25 mg daily and book f/u appt here in 2 weeks for ov and b-met same time

## 2017-05-04 NOTE — Telephone Encounter (Signed)
E-scribed HCTZ to her pharmacy.

## 2017-05-06 ENCOUNTER — Other Ambulatory Visit: Payer: Self-pay | Admitting: Internal Medicine

## 2017-05-23 ENCOUNTER — Encounter: Payer: Self-pay | Admitting: Internal Medicine

## 2017-05-23 ENCOUNTER — Ambulatory Visit (INDEPENDENT_AMBULATORY_CARE_PROVIDER_SITE_OTHER): Payer: PRIVATE HEALTH INSURANCE | Admitting: Internal Medicine

## 2017-05-23 VITALS — BP 118/90 | HR 73 | Wt 179.0 lb

## 2017-05-23 DIAGNOSIS — E782 Mixed hyperlipidemia: Secondary | ICD-10-CM

## 2017-05-23 DIAGNOSIS — E039 Hypothyroidism, unspecified: Secondary | ICD-10-CM | POA: Diagnosis not present

## 2017-05-23 DIAGNOSIS — R05 Cough: Secondary | ICD-10-CM | POA: Diagnosis not present

## 2017-05-23 DIAGNOSIS — I10 Essential (primary) hypertension: Secondary | ICD-10-CM

## 2017-05-23 DIAGNOSIS — R7302 Impaired glucose tolerance (oral): Secondary | ICD-10-CM

## 2017-05-23 DIAGNOSIS — R059 Cough, unspecified: Secondary | ICD-10-CM

## 2017-05-23 NOTE — Progress Notes (Signed)
   Subjective:    Patient ID: Rito EhrlichJulie Paradise Billy, female    DOB: 09/03/1960, 57 y.o.   MRN: [id number]  HPI  57 year old seen in December complaining of a cough that has been going on since the early fall.  She was treated with doxycycline and albuterol inhaler.  There is some questionable remote history of sarcoidosis so a chest x-ray was done which proved to be negative.  The cough did not improved.  She called asking about discontinuing losartan.  We discontinued losartan and treat her with HCTZ and she remains on amlodipine.  The cough has improved.  She is surprised that it took so long for her cough to develop on losartan.  She is pretty sure that is what was causing the cough.  She is feeling much better.  She will need to continue potassium supplement since she is on HCTZ.  We checked her hemoglobin A1c and it is normal.  She has significant hyperlipidemia.  Please see result under lab tab.  She is statin intolerant.  There are some new medications that she might be a candidate for for hyperlipidemia such as Repatha.  We will refer her to the lipid clinic.  Her potassium is normal.  BUN and creatinine are normal.    Review of Systems see above-no new complaints and very relieved that her cough is resolved     Objective:   Physical Exam  Neck supple.  Chest clear.  Cardiac exam regular rate and rhythm normal S1 and S2.  Extremities without edema.      Assessment & Plan:  Essential hypertension-stable on HCTZ and amlodipine.  Losartan discontinued because of cough  Cough-resolved off losartan continue with HCTZ and amlodipine  Mixed hyperlipidemia-refer to lipid clinic.  Continue to monitor blood pressure at home and call if persistently elevated.  Physical exam due in August.  Continue potassium supplement.  May discontinue inhaler.

## 2017-05-24 LAB — LIPID PANEL
CHOL/HDL RATIO: 4.5 (calc) (ref <5.0)
Cholesterol: 295 mg/dL — ABNORMAL HIGH (ref <200)
HDL: 66 mg/dL (ref >50)
LDL Cholesterol (Calc): 195 mg/dL (calc) — ABNORMAL HIGH
NON-HDL CHOLESTEROL (CALC): 229 mg/dL — AB (ref <130)
Triglycerides: 172 mg/dL — ABNORMAL HIGH (ref <150)

## 2017-05-24 LAB — BASIC METABOLIC PANEL
BUN: 18 mg/dL (ref 7–25)
CALCIUM: 10 mg/dL (ref 8.6–10.4)
CO2: 28 mmol/L (ref 20–32)
Chloride: 105 mmol/L (ref 98–110)
Creat: 0.77 mg/dL (ref 0.50–1.05)
Glucose, Bld: 100 mg/dL — ABNORMAL HIGH (ref 65–99)
POTASSIUM: 4.1 mmol/L (ref 3.5–5.3)
SODIUM: 144 mmol/L (ref 135–146)

## 2017-05-24 LAB — TSH: TSH: 1.63 mIU/L (ref 0.40–4.50)

## 2017-05-24 LAB — HEMOGLOBIN A1C
HEMOGLOBIN A1C: 5.6 %{Hb} (ref <5.7)
MEAN PLASMA GLUCOSE: 114 (calc)
eAG (mmol/L): 6.3 (calc)

## 2017-05-25 ENCOUNTER — Encounter: Payer: Self-pay | Admitting: Internal Medicine

## 2017-05-25 NOTE — Patient Instructions (Signed)
Continue Norvasc and HCTZ.  Physical exam due in August.  Monitor blood pressure at home regularly.  Referral to lipid clinic due to statin intolerance.  Discontinue losartan.

## 2017-05-31 ENCOUNTER — Other Ambulatory Visit: Payer: Self-pay | Admitting: Internal Medicine

## 2017-06-29 ENCOUNTER — Other Ambulatory Visit: Payer: Self-pay | Admitting: Internal Medicine

## 2017-07-02 ENCOUNTER — Other Ambulatory Visit: Payer: Self-pay | Admitting: Internal Medicine

## 2017-07-03 ENCOUNTER — Encounter: Payer: Self-pay | Admitting: Internal Medicine

## 2017-07-03 ENCOUNTER — Ambulatory Visit (INDEPENDENT_AMBULATORY_CARE_PROVIDER_SITE_OTHER): Payer: PRIVATE HEALTH INSURANCE | Admitting: Internal Medicine

## 2017-07-03 VITALS — BP 126/80 | HR 73 | Ht 65.0 in | Wt 168.0 lb

## 2017-07-03 DIAGNOSIS — Z8342 Family history of familial hypercholesterolemia: Secondary | ICD-10-CM

## 2017-07-03 DIAGNOSIS — E782 Mixed hyperlipidemia: Secondary | ICD-10-CM | POA: Diagnosis not present

## 2017-07-03 DIAGNOSIS — Z789 Other specified health status: Secondary | ICD-10-CM | POA: Diagnosis not present

## 2017-07-03 MED ORDER — EZETIMIBE 10 MG PO TABS: 10.0000 mg | ORAL_TABLET | Freq: Every day | ORAL | 3 refills | Status: DC

## 2017-07-03 NOTE — Patient Instructions (Signed)
Dr. Rennis GoldenHilty has recommended you make the following change in your medication -- START zetia 10mg  once daily  Dr. Rennis GoldenHilty recommends Repatha 140mg  every 2 weeks (PCSK9). This is an injectable cholesterol medication. This medication will need prior approval with your insurance company, which we will work on. If the medication is not approved initially, we may need to do an appeal with your insurance. We will keep you updated on this process. This medication can be provided at some local pharmacies or be shipped to your from a specialty pharmacy.    Your physician recommends that you return for lab work in THREE MONTHS - fasting - to check cholesterol   Your physician recommends that you schedule a follow-up appointment in THREE MONTHS (after lab work) with Dr. Rennis GoldenHilty.

## 2017-07-03 NOTE — Progress Notes (Signed)
LIPID CLINIC CONSULTATION  Chief Complaint:  Evaluation for dyslipidemia, statin intolerance  Primary Care Physician: Margaree Mackintosh, MD  HPI:  [name] [name] is a 57 y.o. female who is being seen today for the evaluation of dyslipidemia and statin intolerance at the request of Baxley, Luanna Cole, MD. This is a is a 57 year old female patient of Dr. Lenord Fellers, who is kindly referred to me for evaluation of marked dyslipidemia.  She is previously seen my partner Dr. Antoine Poche, for evaluation of dyslipidemia.  In the past he is ordered a coronary artery calcium score which was more performed in November 2017.  This demonstrated 0 coronary artery calcification.  While this is reassuring, she is at significant increased risk based on family history.  Of note her family history is significant for premature onset heart disease in her father who had MI and numerous bypasses starting in his late 48s.  She also had a brother with significant PAD who died prematurely.  There is a sister who had early onset heart disease and 2 other brothers who are on statin therapy for dyslipidemia.  This pattern is suggestive of familial hyperlipidemia.  In addition, her most recent lipid profile showed total cholesterol of 295, HDL 66, triglycerides 172 and LDL-C 195.  She reports eating a very healthy diet, in fact a number of years ago she went completely vegetarian for at least one year.  Not only does she feel like it did not improve her lipid profile, she says she thinks her cholesterol actually was higher.  She is gone back to eating red meats once or twice every couple weeks, and does eat shellfish however once or twice a week and eats about 4 eggs per week.  Most of her diet consists of vegetables and fruits.  Unfortunately she has been statin intolerant as well.  She is been on a number of medications in the past including atorvastatin, rosuvastatin, simvastatin and pravastatin at low and high doses.  Although she has  not had myalgias, she has had significant cognitive impairment which has improved with discontinuing the medications fairly promptly.  She was referred for evaluation of possible PCSK9 inhibitor therapy.  PMHx:  Past Medical History:  Diagnosis Date  . Abnormal Pap smear of cervix    73yrs ago  . Classic migraine 09/18/2013  . Diplopia 09/18/2013  . Diverticulitis   . Generalized headaches   . Hyperlipidemia   . Hypertension     Past Surgical History:  Procedure Laterality Date  . ABDOMINAL HYSTERECTOMY    . BREAST SURGERY     reduction  . CERVICAL CONE BIOPSY     44yrs ago  . CESAREAN SECTION     x 2  . LAPAROSCOPIC APPENDECTOMY N/A 07/31/2014   Procedure: APPENDECTOMY LAPAROSCOPIC;  Surgeon: Ovidio Kin, MD;  Location: [facility];  Service: General;  Laterality: N/A;  . tendon surgery on elbow    . TONSILLECTOMY    . TUBAL LIGATION      FAMHx:  Family History  Problem Relation Age of Onset  . Prostate cancer Father   . Heart disease Father 50       CABGs x 3  . Hypertension Father   . Hyperlipidemia Sister   . Hypertension Sister   . Renal cancer Sister   . Other Sister        bile duct cancer  . Alcohol abuse Brother   . Diabetes Brother        PVD  .  Hypertension Brother   . Cancer Brother   . Hypertension Brother   . Hypertension Brother   . Ovarian cancer Maternal Grandmother     SOCHx:   reports that  has never smoked. she has never used smokeless tobacco. She reports that she drinks about 10.8 oz of alcohol per week. She reports that she does not use drugs.  ALLERGIES:  Allergies  Allergen Reactions  . Ramipril Cough  . Sulfa Antibiotics Other (See Comments)    Itching.    ROS: Pertinent items noted in HPI and remainder of comprehensive ROS otherwise negative.  HOME MEDS: Current Outpatient Medications on File Prior to Visit  Medication Sig Dispense Refill  . ALPRAZolam (XANAX) 0.25 MG tablet Take 1 tablet (0.25 mg total) by mouth 2 (two) times  daily as needed for anxiety. 30 tablet 0  . amLODipine (NORVASC) 5 MG tablet TAKE 1 TABLET BY MOUTH EVERY DAY 90 tablet 3  . hydrochlorothiazide (HYDRODIURIL) 25 MG tablet TAKE 1 TABLET(25 MG) BY MOUTH DAILY 90 tablet 0  . levothyroxine (SYNTHROID, LEVOTHROID) 50 MCG tablet TAKE 1 TABLET(50 MCG) BY MOUTH DAILY 90 tablet 0  . potassium chloride (K-DUR) 10 MEQ tablet TAKE 1 TABLET(10 MEQ) BY MOUTH DAILY 90 tablet 0  . topiramate (TOPAMAX) 25 MG tablet Take 1 tablet (25 mg total) by mouth daily. 90 tablet 3   No current facility-administered medications on file prior to visit.     LABS/IMAGING: No results found for this or any previous visit (from the past 48 hour(s)). No results found.  LIPID PANEL:    Component Value Date/Time   CHOL 295 (H) 05/23/2017 1124   TRIG 172 (H) 05/23/2017 1124   HDL 66 05/23/2017 1124   CHOLHDL 4.5 05/23/2017 1124   VLDL 70 (H) 12/12/2016 1006   LDLCALC 145 (H) 12/12/2016 1006   LDLDIRECT 130.7 11/27/2008 1141    WEIGHTS: Wt Readings from Last 3 Encounters:  07/03/17 168 lb (76.2 kg)  05/23/17 179 lb (81.2 kg)  04/17/17 179 lb (81.2 kg)    VITALS: BP 126/80   Pulse 73   Ht 5\' 5"  (1.651 m)   Wt 168 lb (76.2 kg)   BMI 27.96 kg/m   EXAM: General appearance: alert and no distress Neck: no carotid bruit, no JVD and thyroid not enlarged, symmetric, no tenderness/mass/nodules Lungs: clear to auscultation bilaterally Heart: regular rate and rhythm Abdomen: soft, non-tender; bowel sounds normal; no masses,  no organomegaly Extremities: extremities normal, atraumatic, no cyanosis or edema Pulses: 2+ and symmetric Skin: Skin color, texture, turgor normal. No rashes or lesions Neurologic: Grossly normal Psych: Pleasant  EKG: N/A  ASSESSMENT: 1. 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 No CAC (03/2016) 3. Strong family history of premature CAD in mother, brothers and sister 3. Statin intolerance  PLAN: 1.    Mrs. Warning has no defined coronary artery calcifications, but is at significantly increased cardiovascular risk given a very high LDL cholesterol.  She says this is been a long standing finding.  In the past she has been on a completely vegetarian diet for a year and had no significant improvement in her lipid numbers.  She also has significant family history of early onset heart disease, however her Dutch score is only 4.  The suggest possible familial hyperlipidemia and may warrant further evaluation such as genetic testing.  She is never been on ezetimibe in the past.  I recommend starting 10 mg daily.  In addition we will  attempt to preauthorize a PCSK9 inhibitor for her.  I do think this is the best option.  She had significant statin intolerance to 4 different statins at both low and high intensity dosing as well as twice weekly dosing which caused cognitive impairment.  We will begin workup for PCSK9 inhibitor and plan to repeat lipid profile in about 3 months.  Follow-up with me in lipid clinic afterwards.  Thanks again for the kind referral.  Chrystie NoseKenneth C. Romen Yutzy, MD, [facility] HospitalFACC, FACP  [place]  [facility]  Medical Director of the [facility]  Cardiovascular Risk Reduction Clinic Diplomate of the American Board of Clinical Lipidology Attending Cardiologist  Direct Dial: [phone]  Fax: [phone]  Website:  [email or web address]  Lakesia Dahle C Demontrae Gilbert 07/03/2017, 10:59 AM

## 2017-07-07 ENCOUNTER — Telehealth: Payer: Self-pay | Admitting: Internal Medicine

## 2017-07-07 NOTE — Telephone Encounter (Signed)
Prior authorization for Repatha submitted via [email or web address]  Key: [id number] - [place] Case ID: [id number]

## 2017-07-11 ENCOUNTER — Other Ambulatory Visit: Payer: PRIVATE HEALTH INSURANCE | Admitting: Internal Medicine

## 2017-07-11 NOTE — Telephone Encounter (Signed)
Repatha has been approved for 6 months until 01/07/18

## 2017-07-12 ENCOUNTER — Telehealth: Payer: Self-pay | Admitting: Internal Medicine

## 2017-07-12 MED ORDER — EVOLOCUMAB 140 MG/ML ~~LOC~~ SOAJ: 1.0000 | SUBCUTANEOUS | 5 refills | Status: DC

## 2017-07-12 NOTE — Telephone Encounter (Signed)
Patient called and made aware Repatha has been approved. [facility]. She is aware that her insurance may prefer a specialty pharmacy, at which time she will be notified. She is aware she will need to take her co-pay card to the pharmacy

## 2017-07-12 NOTE — Telephone Encounter (Signed)
[name] [name] is returning your call . Thanks  °

## 2017-07-12 NOTE — Telephone Encounter (Signed)
LMTCB to notify patient that Repatha has been approved. Need to know what pharmacy she would like prescription sent to.

## 2017-07-12 NOTE — Addendum Note (Signed)
Addended by: Lindell SparELKINS, JENNA M on: 07/12/2017 11:51 AM   Modules accepted: Orders

## 2017-07-12 NOTE — Telephone Encounter (Signed)
Patient contacted. Documented in another encounter

## 2017-07-13 ENCOUNTER — Ambulatory Visit: Payer: PRIVATE HEALTH INSURANCE | Admitting: Internal Medicine

## 2017-07-17 MED ORDER — EVOLOCUMAB 140 MG/ML ~~LOC~~ SOAJ: 1.0000 | SUBCUTANEOUS | 3 refills | Status: DC

## 2017-07-17 NOTE — Addendum Note (Signed)
Addended by: Lindell SparELKINS, JENNA M on: 07/17/2017 10:08 AM   Modules accepted: Orders

## 2017-07-17 NOTE — Telephone Encounter (Addendum)
Spoke with patient who reports her insurance company will not allow [facility] to fill Repatha. [facility] [phone]). Called [facility] who cannot process Rx right now, explained that no Rx has been sent to them yet. Rx(s) sent to pharmacy electronically.

## 2017-07-26 ENCOUNTER — Other Ambulatory Visit: Payer: Self-pay | Admitting: Internal Medicine

## 2017-08-21 ENCOUNTER — Other Ambulatory Visit: Payer: Self-pay | Admitting: Internal Medicine

## 2017-09-15 ENCOUNTER — Telehealth: Payer: Self-pay

## 2017-09-15 MED ORDER — VENLAFAXINE HCL ER 37.5 MG PO CP24: 37.5000 mg | ORAL_CAPSULE | Freq: Every day | ORAL | 0 refills | Status: DC

## 2017-09-15 NOTE — Telephone Encounter (Signed)
Please call pt and tell her we are prescribing Effexor 37.5 mg daily #30 and will see her next week

## 2017-09-15 NOTE — Telephone Encounter (Signed)
I tried to call patient to let her know that she can start this prescription today 09/15/17 but was unable to reach her/unable to leave voice mail due to voice machine being full. FYI

## 2017-09-15 NOTE — Telephone Encounter (Signed)
Patient called stated she has been off of venlafaxine for almost a year and would like to know if she can go back to taking, advised patient that she would need to come in to be evaluated and made an appointment for 09/19/17, patient sated she's been depressed all winter and she just "can't take it anymore" I asked her if she was having any thoughts of hurting herself and her answer was that she will be here on Tuesday.

## 2017-09-15 NOTE — Telephone Encounter (Signed)
rx was sent to her local pharmacy.

## 2017-09-17 ENCOUNTER — Other Ambulatory Visit: Payer: Self-pay | Admitting: Nurse Practitioner

## 2017-09-19 ENCOUNTER — Encounter: Payer: Self-pay | Admitting: Internal Medicine

## 2017-09-19 ENCOUNTER — Ambulatory Visit (INDEPENDENT_AMBULATORY_CARE_PROVIDER_SITE_OTHER): Payer: PRIVATE HEALTH INSURANCE | Admitting: Internal Medicine

## 2017-09-19 VITALS — BP 110/88 | HR 67 | Ht 65.0 in | Wt 166.0 lb

## 2017-09-19 DIAGNOSIS — I10 Essential (primary) hypertension: Secondary | ICD-10-CM | POA: Diagnosis not present

## 2017-09-19 DIAGNOSIS — F321 Major depressive disorder, single episode, moderate: Secondary | ICD-10-CM | POA: Diagnosis not present

## 2017-09-19 DIAGNOSIS — Z789 Other specified health status: Secondary | ICD-10-CM

## 2017-09-19 MED ORDER — VENLAFAXINE HCL ER 75 MG PO CP24: 75.0000 mg | ORAL_CAPSULE | Freq: Every day | ORAL | 0 refills | Status: DC

## 2017-09-19 NOTE — Progress Notes (Signed)
   Subjective:    Patient ID: [name] [name], female    DOB: [birth year], 57 y.o.   MRN: [id number]  HPI 57 year old Female with history of hypertension and hyperlipidemia in today to discuss depression.  History of mild hypothyroidism.  TSH was checked in January and was within normal limits on low-dose replacement.  She was recommended for Repatha therapy by Dr. Rennis Golden as she is statin intolerant.  Notes indicate it was approved for 6 months through January 07, 2018.  Patient says she has been depressed for several months.  She tried discontinuing the Effexor slowly.  She thought she could do without it but she became more complacent and did not really want to leave the house.  She has not been playing tennis.  [place]  Plans to go this coming weekend.  Says her children are fine.  Says she and her husband are getting along fine.  Just does not feel like doing anything.  Does not want to cook which is unusual for her.  She does not want to go to counseling.    Review of Systems see above-does not want to harm herself.  She is not pleased with her weight gain.     Objective:   Physical Exam Blood pressure good at 110/88.  Weight is 166 pounds.  Weight in January was 179 pounds.  Her affect is a bit flat and somewhat sad.       Assessment & Plan:  Depression  Hypertension-stable  Hyperlipidemia-statin intolerant now on Repatha  Plan: Last week  when she called,  we restarted  Effexor at 37.5 mg daily.  Increase to 75 mg daily and follow-up in 4 to 6 weeks.  25 minutes spent with patient

## 2017-09-24 NOTE — Patient Instructions (Signed)
Increase Effexor to 75 mg XR daily.  Follow-up in 6 weeks or sooner if necessary.

## 2017-09-25 ENCOUNTER — Other Ambulatory Visit: Payer: Self-pay | Admitting: Internal Medicine

## 2017-10-14 ENCOUNTER — Other Ambulatory Visit: Payer: Self-pay | Admitting: Internal Medicine

## 2017-10-16 NOTE — Telephone Encounter (Signed)
Please call pharmacy and make sure this dose 37.5 mg is discontinued and that pt has refills on 75 mg daily for 90 days

## 2017-10-18 ENCOUNTER — Other Ambulatory Visit: Payer: Self-pay | Admitting: Internal Medicine

## 2017-11-07 ENCOUNTER — Other Ambulatory Visit: Payer: Self-pay | Admitting: Internal Medicine

## 2017-11-11 LAB — LIPID PANEL
CHOL/HDL RATIO: 3.1 ratio (ref 0.0–4.4)
Cholesterol, Total: 220 mg/dL — ABNORMAL HIGH (ref 100–199)
HDL: 72 mg/dL (ref >39)
LDL CALC: 119 mg/dL — AB (ref 0–99)
TRIGLYCERIDES: 145 mg/dL (ref 0–149)
VLDL Cholesterol Cal: 29 mg/dL (ref 5–40)

## 2017-11-14 ENCOUNTER — Other Ambulatory Visit: Payer: Self-pay

## 2017-11-14 ENCOUNTER — Other Ambulatory Visit: Payer: Self-pay | Admitting: Internal Medicine

## 2017-11-14 MED ORDER — EZETIMIBE 10 MG PO TABS: 10.0000 mg | ORAL_TABLET | Freq: Every day | ORAL | 0 refills | Status: DC

## 2017-11-30 ENCOUNTER — Ambulatory Visit (INDEPENDENT_AMBULATORY_CARE_PROVIDER_SITE_OTHER): Payer: PRIVATE HEALTH INSURANCE | Admitting: Internal Medicine

## 2017-11-30 ENCOUNTER — Encounter: Payer: Self-pay | Admitting: Internal Medicine

## 2017-11-30 VITALS — BP 110/70 | HR 72 | Ht 65.0 in | Wt 168.0 lb

## 2017-11-30 DIAGNOSIS — E782 Mixed hyperlipidemia: Secondary | ICD-10-CM

## 2017-11-30 DIAGNOSIS — Z789 Other specified health status: Secondary | ICD-10-CM

## 2017-11-30 DIAGNOSIS — I7789 Other specified disorders of arteries and arterioles: Secondary | ICD-10-CM | POA: Diagnosis not present

## 2017-11-30 MED ORDER — EVOLOCUMAB 140 MG/ML ~~LOC~~ SOAJ: 1.0000 | SUBCUTANEOUS | 3 refills | Status: DC

## 2017-11-30 NOTE — Progress Notes (Signed)
LIPID CLINIC CONSULTATION  Chief Complaint:  Evaluation for dyslipidemia, statin intolerance  Primary Care Physician: [name] Mackintosh, MD  HPI:  [name] [name] is a 57 y.o. female who is being seen today for the evaluation of dyslipidemia and statin intolerance at the request of Baxley, Luanna Cole, MD. This is a is a 57 year old female patient of [name] [name], who is kindly referred to me for evaluation of marked dyslipidemia.  She is previously seen my partner [name] [name], for evaluation of dyslipidemia.  In the past he is ordered a coronary artery calcium score which was more performed in November 2017.  This demonstrated 0 coronary artery calcification.  While this is reassuring, she is at significant increased risk based on family history.  Of note her family history is significant for premature onset heart disease in her father who had MI and numerous bypasses starting in his late 109s.  She also had a brother with significant PAD who died prematurely.  There is a sister who had early onset heart disease and 2 other brothers who are on statin therapy for dyslipidemia.  This pattern is suggestive of familial hyperlipidemia.  In addition, her most recent lipid profile showed total cholesterol of 295, HDL 66, triglycerides 172 and LDL-C 195.  She reports eating a very healthy diet, in fact a number of years ago she went completely vegetarian for at least one year.  Not only does she feel like it did not improve her lipid profile, she says she thinks her cholesterol actually was higher.  She is gone back to eating red meats once or twice every couple weeks, and does eat shellfish however once or twice a week and eats about 4 eggs per week.  Most of her diet consists of vegetables and fruits.  Unfortunately she has been statin intolerant as well.  She is been on a number of medications in the past including atorvastatin, rosuvastatin, simvastatin and pravastatin at low and high doses.  Although she has  not had myalgias, she has had significant cognitive impairment which has improved with discontinuing the medications fairly promptly.  She was referred for evaluation of possible PCSK9 inhibitor therapy.  11/30/2017  [name] [name] returns today for follow-up.  She seems to be doing well on Repatha.  Unfortunately her last injection she injected a little to anterior of the thigh and probably had a blood vessel.  This caused some pain and she redrew the needle before it was completely injected.  She would not likely get the full dose.  Despite this she is been doing well and is had a very good reduction in cholesterol.  Her total cholesterol now is only 220 with LDL 119.  Although this is not the 65% reduction we would expect, she was also on ezetimibe and for some reason was told to discontinue this.  I had intended her to continue on ezetimibe with this.  Subsequently she is restarted ezetimibe.  She denies any cognitive impairments.  PMHx:  Past Medical History:  Diagnosis Date  . Abnormal Pap smear of cervix    12yrs ago  . Classic migraine 09/18/2013  . Diplopia 09/18/2013  . Diverticulitis   . Generalized headaches   . Hyperlipidemia   . Hypertension     Past Surgical History:  Procedure Laterality Date  . ABDOMINAL HYSTERECTOMY    . BREAST SURGERY     reduction  . CERVICAL CONE BIOPSY     42yrs ago  . CESAREAN SECTION  x 2  . LAPAROSCOPIC APPENDECTOMY N/A 07/31/2014   Procedure: APPENDECTOMY LAPAROSCOPIC;  Surgeon: [name] Kin, MD;  Location: [facility];  Service: General;  Laterality: N/A;  . tendon surgery on elbow    . TONSILLECTOMY    . TUBAL LIGATION      FAMHx:  Family History  Problem Relation Age of Onset  . Prostate cancer Father   . Heart disease Father 50       CABGs x 3  . Hypertension Father   . Hyperlipidemia Sister   . Hypertension Sister   . Renal cancer Sister   . Other Sister        bile duct cancer  . Alcohol abuse Brother   . Diabetes Brother         PVD  . Hypertension Brother   . Cancer Brother   . Hypertension Brother   . Hypertension Brother   . Ovarian cancer Maternal Grandmother     SOCHx:   reports that she has never smoked. She has never used smokeless tobacco. She reports that she drinks about 10.8 oz of alcohol per week. She reports that she does not use drugs.  ALLERGIES:  Allergies  Allergen Reactions  . Statins Other (See Comments)    Cognitive impairment - Atorvastatin, Rosuvastatin, Pravastatin, Simvastatin (high and low doses)  . Ramipril Cough  . Sulfa Antibiotics Other (See Comments)    Itching.    ROS: Pertinent items noted in HPI and remainder of comprehensive ROS otherwise negative.  HOME MEDS: Current Outpatient Medications on File Prior to Visit  Medication Sig Dispense Refill  . Evolocumab (REPATHA SURECLICK) 140 MG/ML SOAJ Inject 1 Dose into the skin every 14 (fourteen) days. 6 pen 3  . ezetimibe (ZETIA) 10 MG tablet Take 1 tablet (10 mg total) by mouth daily. 90 tablet 0  . hydrochlorothiazide (HYDRODIURIL) 25 MG tablet TAKE 1 TABLET(25 MG) BY MOUTH DAILY 90 tablet 0  . levothyroxine (SYNTHROID, LEVOTHROID) 50 MCG tablet TAKE 1 TABLET(50 MCG) BY MOUTH DAILY 90 tablet 0  . potassium chloride (K-DUR) 10 MEQ tablet TAKE 1 TABLET(10 MEQ) BY MOUTH DAILY 90 tablet 1  . topiramate (TOPAMAX) 25 MG tablet TAKE 1 TABLET(25 MG) BY MOUTH DAILY 90 tablet 3  . venlafaxine XR (EFFEXOR-XR) 75 MG 24 hr capsule TAKE 1 CAPSULE(75 MG) BY MOUTH DAILY WITH BREAKFAST 30 capsule 0   No current facility-administered medications on file prior to visit.     LABS/IMAGING: No results found for this or any previous visit (from the past 48 hour(s)). No results found.  LIPID PANEL:    Component Value Date/Time   CHOL 220 (H) 11/10/2017 1129   TRIG 145 11/10/2017 1129   HDL 72 11/10/2017 1129   CHOLHDL 3.1 11/10/2017 1129   CHOLHDL 4.5 05/23/2017 1124   VLDL 70 (H) 12/12/2016 1006   LDLCALC 119 (H) 11/10/2017 1129    LDLCALC 195 (H) 05/23/2017 1124   LDLDIRECT 130.7 11/27/2008 1141    WEIGHTS: Wt Readings from Last 3 Encounters:  11/30/17 168 lb (76.2 kg)  09/19/17 166 lb (75.3 kg)  07/03/17 168 lb (76.2 kg)    VITALS: BP 110/70   Pulse 72   Ht 5\' 5"  (1.651 m)   Wt 168 lb (76.2 kg) Comment: pt refused to weigh--states weigght is same as last visit  BMI 27.96 kg/m   EXAM: Deferred  EKG: Deferred  ASSESSMENT: 1. 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 No CAC (03/2016) 3. Strong  family history of premature CAD in mother, brothers and sister 504. Statin intolerance  PLAN: 1.   Mrs. Chestine SporeClark has done well on ezetimibe.  Her LDL is now 119 (down from 361195), a good response but less than the expected 65% reduction.  Total cholesterol 220.  She is had several injections.  Her last injection was only a partial injection due to some injection site pain as she may have struck a blood vessel.  I have advised her to use more lateral approach to injections.  We will repeat a lipid profile in 3 months and I expect her numbers to be even better.  Follow-up with me afterwards.  Chrystie NoseKenneth C. Wadie Mattie, MD, [facility] PcFACC, FACP  [place]  [facility]  Medical Director of the [facility] Lipid Disorders &  Cardiovascular Risk Reduction Clinic Diplomate of the American Board of Clinical Lipidology Attending Cardiologist  Direct Dial: [phone]  Fax: [phone]  Website:  [email or web address]  Broly Hatfield C Vamsi Apfel 11/30/2017, 9:35 AM

## 2017-11-30 NOTE — Patient Instructions (Signed)
Medication Instructions:   Continue current medications  Labwork:  FASTING lab work in 3 months to check cholesterol  Testing/Procedures:  NONE  Follow-Up:  Your physician recommends that you schedule a follow-up appointment in THREE MONTHS with Dr. Rennis GoldenHilty (lipid clinic)   If you need a refill on your cardiac medications before your next appointment, please call your pharmacy.  Any Other Special Instructions Will Be Listed Below (If Applicable).

## 2017-12-14 ENCOUNTER — Other Ambulatory Visit: Payer: Self-pay | Admitting: Internal Medicine

## 2017-12-18 ENCOUNTER — Telehealth: Payer: Self-pay | Admitting: Internal Medicine

## 2017-12-18 NOTE — Telephone Encounter (Signed)
Submitted prior auth for Repatha via [email or web address] (Key: [id number])

## 2017-12-21 NOTE — Telephone Encounter (Signed)
Received fax notice from [employer] that Repatha has been approved for 12 months until 12/19/2018

## 2017-12-27 ENCOUNTER — Emergency Department (HOSPITAL_COMMUNITY)
Admission: EM | Admit: 2017-12-27 | Discharge: 2017-12-27 | Disposition: A | Discharge status: Home / Self Care | Payer: PRIVATE HEALTH INSURANCE | Attending: Emergency Medicine | Admitting: Emergency Medicine

## 2017-12-27 ENCOUNTER — Encounter (HOSPITAL_COMMUNITY): Payer: Self-pay | Admitting: Emergency Medicine

## 2017-12-27 DIAGNOSIS — T7840XA Allergy, unspecified, initial encounter: Secondary | ICD-10-CM

## 2017-12-27 DIAGNOSIS — I10 Essential (primary) hypertension: Secondary | ICD-10-CM | POA: Insufficient documentation

## 2017-12-27 DIAGNOSIS — E039 Hypothyroidism, unspecified: Secondary | ICD-10-CM | POA: Insufficient documentation

## 2017-12-27 DIAGNOSIS — W57XXXA Bitten or stung by nonvenomous insect and other nonvenomous arthropods, initial encounter: Secondary | ICD-10-CM | POA: Insufficient documentation

## 2017-12-27 DIAGNOSIS — Z79899 Other long term (current) drug therapy: Secondary | ICD-10-CM | POA: Diagnosis not present

## 2017-12-27 MED ORDER — HYDROXYZINE HCL 10 MG PO TABS
10.0000 mg | ORAL_TABLET | Freq: Once | ORAL | Status: AC
  Administered 2017-12-27: 10 mg via ORAL
  Filled 2017-12-27: qty 1

## 2017-12-27 MED ORDER — PREDNISONE 20 MG PO TABS: 40.0000 mg | ORAL_TABLET | Freq: Every day | ORAL | 0 refills | Status: DC

## 2017-12-27 MED ORDER — DIPHENHYDRAMINE HCL 50 MG/ML IJ SOLN
25.0000 mg | Freq: Once | INTRAMUSCULAR | Status: AC
  Administered 2017-12-27: 25 mg via INTRAVENOUS
  Filled 2017-12-27: qty 1

## 2017-12-27 MED ORDER — HYDROXYZINE HCL 10 MG PO TABS: 10.0000 mg | ORAL_TABLET | Freq: Three times a day (TID) | ORAL | 0 refills | Status: DC | PRN

## 2017-12-27 MED ORDER — FAMOTIDINE IN NACL 20-0.9 MG/50ML-% IV SOLN
20.0000 mg | Freq: Once | INTRAVENOUS | Status: AC
  Administered 2017-12-27: 20 mg via INTRAVENOUS
  Filled 2017-12-27: qty 50

## 2017-12-27 MED ORDER — METHYLPREDNISOLONE SODIUM SUCC 125 MG IJ SOLR
125.0000 mg | Freq: Once | INTRAMUSCULAR | Status: AC
  Administered 2017-12-27: 125 mg via INTRAVENOUS
  Filled 2017-12-27: qty 2

## 2017-12-27 MED ORDER — SODIUM CHLORIDE 0.9 % IV BOLUS
1000.0000 mL | Freq: Once | INTRAVENOUS | Status: AC
  Administered 2017-12-27: 1000 mL via INTRAVENOUS

## 2017-12-27 MED ORDER — DIPHENHYDRAMINE HCL 25 MG PO TABS: 25.0000 mg | ORAL_TABLET | Freq: Three times a day (TID) | ORAL | 0 refills | Status: DC

## 2017-12-27 MED ORDER — FAMOTIDINE 20 MG PO TABS: 20.0000 mg | ORAL_TABLET | Freq: Two times a day (BID) | ORAL | 0 refills | Status: DC

## 2017-12-27 NOTE — ED Provider Notes (Signed)
[facility] EMERGENCY DEPARTMENT Provider Note   CSN: [id number] Arrival date & time: 12/27/17  0741     History   Chief Complaint Chief Complaint  Patient presents with  . Allergic Reaction    HPI [name] [name] is a 57 y.o. female.  HPI Patient presents with concern of pain, itching, swelling around 2 areas on her habitus. Patient was well until about 1 week ago, when she was stung 3 times by yellow jackets. 3 lesions were in her lower extremities, one in the right medial calf, one on the left proximal anterior thigh, one in the buttocks. She began taking Benadryl, and the right calf lesion has essentially resolved purulent however, over the last 2 days, possibly slightly longer, the patient has had increasing pain, swelling, discoloration, with new worsening rash around to the buttocks and left thigh lesions. There is enlargement of the rash, with severe itching in spite of taking multiple doses of Benadryl, and initiating a course of steroids yesterday. No fever, no vomiting, no other systemic complaints.  Past Medical History:  Diagnosis Date  . Abnormal Pap smear of cervix    83yrs ago  . Classic migraine 09/18/2013  . Diplopia 09/18/2013  . Diverticulitis   . Generalized headaches   . Hyperlipidemia   . Hypertension     Patient Active Problem List   Diagnosis Date Noted  . Enlarged aorta (HCC) 11/30/2017  . Family history of high cholesterol 07/03/2017  . Statin intolerance 07/03/2017  . Hypothyroidism 12/30/2016  . Appendicitis 07/31/2014  . Diplopia 09/18/2013  . Classic migraine 09/18/2013  . Sarcoidosis 07/25/2012  . Anxiety 07/10/2011  . Mood disorder (HCC) 07/03/2011  . Hypertension 07/03/2011  . History of migraine headaches 07/03/2011  . Hyperlipidemia, mixed 11/27/2008    Past Surgical History:  Procedure Laterality Date  . ABDOMINAL HYSTERECTOMY    . BREAST SURGERY     reduction  . CERVICAL CONE BIOPSY     79yrs ago    . CESAREAN SECTION     x 2  . LAPAROSCOPIC APPENDECTOMY N/A 07/31/2014   Procedure: APPENDECTOMY LAPAROSCOPIC;  Surgeon: Ovidio Kin, MD;  Location: [facility];  Service: General;  Laterality: N/A;  . tendon surgery on elbow    . TONSILLECTOMY    . TUBAL LIGATION       OB History    Gravida  2   Para  2   Term  2   Preterm      AB      Living  2     SAB      TAB      Ectopic      Multiple      Live Births               Home Medications    Prior to Admission medications   Medication Sig Start Date End Date Taking? Authorizing Provider  Evolocumab (REPATHA SURECLICK) 140 MG/ML SOAJ Inject 1 Dose into the skin every 14 (fourteen) days. 11/30/17   Hilty, Lisette Abu, MD  ezetimibe (ZETIA) 10 MG tablet Take 1 tablet (10 mg total) by mouth daily. 11/14/17 02/12/18  Jodelle Gross, NP  hydrochlorothiazide (HYDRODIURIL) 25 MG tablet TAKE 1 TABLET(25 MG) BY MOUTH DAILY 11/14/17   Margaree Mackintosh, MD  levothyroxine (SYNTHROID, LEVOTHROID) 50 MCG tablet TAKE 1 TABLET(50 MCG) BY MOUTH DAILY 10/18/17   Margaree Mackintosh, MD  potassium chloride (K-DUR) 10 MEQ tablet TAKE 1 TABLET(10 MEQ) BY MOUTH  DAILY 11/07/17   Margaree MackintoshBaxley, Mary J, MD  topiramate (TOPAMAX) 25 MG tablet TAKE 1 TABLET(25 MG) BY MOUTH DAILY 09/18/17   Nilda RiggsMartin, Nancy Carolyn, NP  venlafaxine XR (EFFEXOR-XR) 75 MG 24 hr capsule TAKE 1 CAPSULE(75 MG) BY MOUTH DAILY WITH BREAKFAST 12/14/17   Margaree MackintoshBaxley, Mary J, MD    Family History Family History  Problem Relation Age of Onset  . Prostate cancer Father   . Heart disease Father 50       CABGs x 3  . Hypertension Father   . Hyperlipidemia Sister   . Hypertension Sister   . Renal cancer Sister   . Other Sister        bile duct cancer  . Alcohol abuse Brother   . Diabetes Brother        PVD  . Hypertension Brother   . Cancer Brother   . Hypertension Brother   . Hypertension Brother   . Ovarian cancer Maternal Grandmother     Social History Social History   Tobacco Use   . Smoking status: Never Smoker  . Smokeless tobacco: Never Used  Substance Use Topics  . Alcohol use: Yes    Alcohol/week: 18.0 standard drinks    Types: 18 Standard drinks or equivalent per week  . Drug use: No     Allergies   Statins; Ramipril; and Sulfa antibiotics   Review of Systems Review of Systems  Constitutional:       Per HPI, otherwise negative  HENT:       Per HPI, otherwise negative  Respiratory:       Per HPI, otherwise negative  Cardiovascular:       Per HPI, otherwise negative  Gastrointestinal: Negative for vomiting.  Endocrine:       Negative aside from HPI  Genitourinary:       Neg aside from HPI   Musculoskeletal:       Per HPI, otherwise negative  Skin: Positive for rash.  Neurological: Negative for syncope.     Physical Exam Updated Vital Signs BP (!) 133/98   Pulse 69   Temp 97.7 F (36.5 C) (Oral)   Resp 18   SpO2 95%   Physical Exam  Constitutional: She is oriented to person, place, and time. She appears well-developed and well-nourished. No distress.  HENT:  Head: Normocephalic and atraumatic.  Eyes: Conjunctivae and EOM are normal.  Cardiovascular: Normal rate, regular rhythm and intact distal pulses.  Pulmonary/Chest: Effort normal. No stridor. No respiratory distress.  Abdominal: She exhibits no distension.  Musculoskeletal: She exhibits no edema.  Neurological: She is alert and oriented to person, place, and time. No cranial nerve deficit.  Skin: Skin is warm and dry.     Psychiatric: She has a normal mood and affect.  Nursing note and vitals reviewed.    ED Treatments / Results   Procedures Procedures (including critical care time)  Medications Ordered in ED Medications  sodium chloride 0.9 % bolus 1,000 mL (has no administration in time range)  methylPREDNISolone sodium succinate (SOLU-MEDROL) 125 mg/2 mL injection 125 mg (has no administration in time range)  hydrOXYzine (ATARAX/VISTARIL) tablet 10 mg (has no  administration in time range)     Initial Impression / Assessment and Plan / ED Course  I have reviewed the triage vital signs and the nursing notes.  Pertinent labs & imaging results that were available during my care of the patient were reviewed by me and considered in my medical decision making (see chart  for details).    After initial Atarax, fluids, Solu-Medrol patient has improvement, though with persistent itchiness.  She reiterates concerned that she may have dehydration. Now, after finishing fluids, receiving additional Benadryl, IV Pepcid, the patient has substantial improvement in the coloration of her leg, reduction in her itchiness Absent evidence for progression of her allergic reaction, no evidence for anaphylaxis, some suspicion for delayed hypersensitivity, patient will be discharged with ongoing steroids, antihistamines.   Final Clinical Impressions(s) / ED Diagnoses   Final diagnoses:  Allergic reaction, initial encounter     Gerhard Munch, MD 12/27/17 1259

## 2017-12-27 NOTE — ED Notes (Signed)
Pt requesting to not have another IV; dr. Jeraldine LootsLockwood aware and ok with this ; no further orders recieved

## 2017-12-27 NOTE — ED Triage Notes (Signed)
Patient to ED c/o worsening rash following yellow-jacket bee stings a week ago - was stung 3 times - upper L thigh, back right side, and lower R leg. First two sting sites have hematoma appearing circle with circle-like rash extending even further. She reports feeling like her body is burning and stinging. Denies shortness of breath.

## 2017-12-27 NOTE — Discharge Instructions (Signed)
In addition to the prescribed steroids, please continue using Benadryl 3 times daily, and add Pepcid, twice daily. He may use Atarax for breakthrough itchiness. Return here for concerning changes in your condition.

## 2018-01-05 ENCOUNTER — Ambulatory Visit (INDEPENDENT_AMBULATORY_CARE_PROVIDER_SITE_OTHER): Payer: PRIVATE HEALTH INSURANCE | Admitting: Neurology

## 2018-01-05 ENCOUNTER — Other Ambulatory Visit: Payer: Self-pay

## 2018-01-05 ENCOUNTER — Encounter: Payer: Self-pay | Admitting: Neurology

## 2018-01-05 VITALS — BP 110/78 | HR 71 | Ht 65.0 in

## 2018-01-05 DIAGNOSIS — G43109 Migraine with aura, not intractable, without status migrainosus: Secondary | ICD-10-CM | POA: Diagnosis not present

## 2018-01-05 NOTE — Progress Notes (Signed)
Reason for visit: Migraine headache  [name] [name] is an 57 y.o. female  History of present illness:  [name] [name] is a 57 year old right-handed white female with a history of migraine headache.  The patient has done very well even on a low-dose of the Topamax taking 25 mg daily.  If she tries to come off the medication, she will begin having migraine within 2 days.  She occasionally may have a dull pain behind the right eye when she goes to sleep at night, but she never develops a migraine.  She has Maxalt to take at home but she does not take the medication as she does not need to.  The patient remains on Effexor as well.  She came off of Effexor for 8 months but felt poorly, she went back on the medication.  The patient does report some mild problems with remembering names for people, this has been life long problem, but she is becoming a bit more concerned about that as she gets older.  The patient returns for an evaluation.  Past Medical History:  Diagnosis Date  . Abnormal Pap smear of cervix    31yrs ago  . Classic migraine 09/18/2013  . Diplopia 09/18/2013  . Diverticulitis   . Generalized headaches   . Hyperlipidemia   . Hypertension     Past Surgical History:  Procedure Laterality Date  . ABDOMINAL HYSTERECTOMY    . BREAST SURGERY     reduction  . CERVICAL CONE BIOPSY     41yrs ago  . CESAREAN SECTION     x 2  . LAPAROSCOPIC APPENDECTOMY N/A 07/31/2014   Procedure: APPENDECTOMY LAPAROSCOPIC;  Surgeon: Ovidio Kin, MD;  Location: [facility];  Service: General;  Laterality: N/A;  . tendon surgery on elbow    . TONSILLECTOMY    . TUBAL LIGATION      Family History  Problem Relation Age of Onset  . Prostate cancer Father   . Heart disease Father 50       CABGs x 3  . Hypertension Father   . Hyperlipidemia Sister   . Hypertension Sister   . Renal cancer Sister   . Other Sister        bile duct cancer  . Alcohol abuse Brother   . Diabetes Brother        PVD    . Hypertension Brother   . Cancer Brother   . Hypertension Brother   . Hypertension Brother   . Ovarian cancer Maternal Grandmother     Social history:  reports that she has never smoked. She has never used smokeless tobacco. She reports that she drinks about 18.0 standard drinks of alcohol per week. She reports that she does not use drugs.    Allergies  Allergen Reactions  . Statins Other (See Comments)    Cognitive impairment - Atorvastatin, Rosuvastatin, Pravastatin, Simvastatin (high and low doses)  . Ramipril Cough  . Sulfa Antibiotics Other (See Comments)    Itching.  . Yellow Jacket Venom [Bee Venom] Hives, Swelling and Rash    Medications:  Prior to Admission medications   Medication Sig Start Date End Date Taking? Authorizing Provider  amLODipine (NORVASC) 5 MG tablet Take 5 mg by mouth daily. 12/14/17  Yes [provider]  diphenhydrAMINE (BENADRYL) 25 MG tablet Take 1 tablet (25 mg total) by mouth 3 (three) times daily. Take one tablet three times daily for two days 12/27/17  Yes Gerhard Munch, MD  Evolocumab [facility] SURECLICK)  140 MG/ML SOAJ Inject 1 Dose into the skin every 14 (fourteen) days. 11/30/17  Yes Hilty, Lisette Abu, MD  ezetimibe (ZETIA) 10 MG tablet Take 1 tablet (10 mg total) by mouth daily. 11/14/17 02/12/18 Yes Jodelle Gross, NP  hydrochlorothiazide (HYDRODIURIL) 25 MG tablet TAKE 1 TABLET(25 MG) BY MOUTH DAILY Patient taking differently: Take 25 mg by mouth daily.  11/14/17  Yes Baxley, Luanna Cole, MD  levothyroxine (SYNTHROID, LEVOTHROID) 50 MCG tablet TAKE 1 TABLET(50 MCG) BY MOUTH DAILY Patient taking differently: Take 50 mcg by mouth daily before breakfast.  10/18/17  Yes Baxley, Luanna Cole, MD  potassium chloride (K-DUR) 10 MEQ tablet TAKE 1 TABLET(10 MEQ) BY MOUTH DAILY Patient taking differently: Take 10 mEq by mouth daily.  11/07/17  Yes Baxley, Luanna Cole, MD  topiramate (TOPAMAX) 25 MG tablet TAKE 1 TABLET(25 MG) BY MOUTH DAILY Patient taking  differently: Take 25 mg by mouth at bedtime.  09/18/17  Yes Nilda Riggs, NP  venlafaxine XR (EFFEXOR-XR) 75 MG 24 hr capsule TAKE 1 CAPSULE(75 MG) BY MOUTH DAILY WITH BREAKFAST Patient taking differently: Take 75 mg by mouth daily with breakfast.  12/14/17  Yes Baxley, Luanna Cole, MD    ROS:  Out of a complete 14 system review of symptoms, the patient complains only of the following symptoms, and all other reviewed systems are negative.  Headache  Blood pressure 110/78, pulse 71, height 5\' 5"  (1.651 m).  Physical Exam  General: The patient is alert and cooperative at the time of the examination.  Skin: No significant peripheral edema is noted.   Neurologic Exam  Mental status: The patient is alert and oriented x 3 at the time of the examination. The patient has apparent normal recent and remote memory, with an apparently normal attention span and concentration ability.   Cranial nerves: Facial symmetry is present. Speech is normal, no aphasia or dysarthria is noted. Extraocular movements are full. Visual fields are full.  Motor: The patient has good strength in all 4 extremities.  Sensory examination: Soft touch sensation is symmetric on the face, arms, and legs.  Coordination: The patient has good finger-nose-finger and heel-to-shin bilaterally.  Gait and station: The patient has a normal gait. Tandem gait is normal. Romberg is negative. No drift is seen.  Reflexes: Deep tendon reflexes are symmetric.   Assessment/Plan:  1.  Common migraine  2.  Reported memory disturbance  The patient will continue the Topamax, she does have Maxalt to take if needed.  The patient is somewhat concerned about her memory, this will need to be monitored over time.  She has difficulty with remembering names for people, all other cognitive functions appear to be normal.  She will follow-up in 1 year.  [name] Palau MD 01/05/2018 9:22 AM  [facility] [address] [place], [place] [address]  Phone [phone] Fax [phone]

## 2018-01-11 ENCOUNTER — Other Ambulatory Visit: Payer: Self-pay | Admitting: Internal Medicine

## 2018-01-18 ENCOUNTER — Other Ambulatory Visit: Payer: Self-pay | Admitting: Internal Medicine

## 2018-01-18 NOTE — Telephone Encounter (Addendum)
Needs appt November. Do not see in Epic. Have refilled Levothyroxine and effexor.

## 2018-01-22 ENCOUNTER — Encounter: Payer: Self-pay | Admitting: Internal Medicine

## 2018-01-22 ENCOUNTER — Telehealth: Payer: Self-pay | Admitting: Internal Medicine

## 2018-01-22 NOTE — Telephone Encounter (Signed)
Patient requested refills on her Effexor and Synthroid today.  Dr. Lenord FellersBaxley indicated that she needed to make an appointment for CPE as she is overdue since 11/2017.    Patient returned the call to Araceli.  I spoke with patient since Celi was with a patient.  Patient stated that she couldn't believe that it had been that long since she was here in the office.  Advised that she had not had a CPE since 12/16/16.  Patient just cannot believe it has been that long since she had CPE.  So, I went through her visit history and advised of her visits.    CPE 12/16/16 6 month 07/13/17 (cancelled) Dr. Rennis GoldenHilty 07/03/17 Start back on Effexor 09/19/17 (Last visit here)  Received 2 refill request this morning (Synthroid and Effexor).  Araceli called patient to advise she needed to book a CPE as it is overdue.    Patient tells me IF that is in fact true (she will need to find her calendar and go through it), she will call me back next week to book.  She is out of town at the moment and she will need to look through her calendar, because she doesn't feel it has been that long since she has had a physical.    Just noting the dates of the patient's dates of service with Dr. Lenord FellersBaxley.    Dr. Lenord FellersBaxley sent in the 2 medications for the patient this afternoon.  However, Dr. Lenord FellersBaxley stated that patient will NOT be getting any additional refills until she is seen for a visit.

## 2018-01-22 NOTE — Telephone Encounter (Signed)
Left message for patient to call office to schedule year exam.

## 2018-01-29 ENCOUNTER — Other Ambulatory Visit: Payer: Self-pay | Admitting: Internal Medicine

## 2018-01-29 NOTE — Telephone Encounter (Signed)
Please make this change. She can take 2 of the 37.5 mg to equal 75

## 2018-01-29 NOTE — Telephone Encounter (Signed)
Patient called states we refilled venlafaxine 37.5mg  and she is now taking 75mg  she would like this rx sent to her pharmacy.

## 2018-01-31 ENCOUNTER — Other Ambulatory Visit: Payer: Self-pay

## 2018-01-31 DIAGNOSIS — I83893 Varicose veins of bilateral lower extremities with other complications: Secondary | ICD-10-CM

## 2018-02-12 ENCOUNTER — Other Ambulatory Visit: Payer: Self-pay | Admitting: Internal Medicine

## 2018-02-21 ENCOUNTER — Other Ambulatory Visit: Payer: Self-pay | Admitting: *Deleted

## 2018-02-21 MED ORDER — EZETIMIBE 10 MG PO TABS: 10.0000 mg | ORAL_TABLET | Freq: Every day | ORAL | 0 refills | Status: DC

## 2018-02-28 ENCOUNTER — Ambulatory Visit (HOSPITAL_COMMUNITY): Payer: PRIVATE HEALTH INSURANCE

## 2018-02-28 ENCOUNTER — Encounter: Payer: PRIVATE HEALTH INSURANCE | Admitting: Vascular Surgery

## 2018-02-28 ENCOUNTER — Encounter

## 2018-03-01 LAB — LIPID PANEL
CHOLESTEROL TOTAL: 156 mg/dL (ref 100–199)
Chol/HDL Ratio: 2.6 ratio (ref 0.0–4.4)
HDL: 61 mg/dL (ref >39)
LDL CALC: 45 mg/dL (ref 0–99)
Triglycerides: 249 mg/dL — ABNORMAL HIGH (ref 0–149)
VLDL CHOLESTEROL CAL: 50 mg/dL — AB (ref 5–40)

## 2018-03-02 ENCOUNTER — Ambulatory Visit (INDEPENDENT_AMBULATORY_CARE_PROVIDER_SITE_OTHER): Payer: PRIVATE HEALTH INSURANCE | Admitting: Certified Nurse Midwife

## 2018-03-02 ENCOUNTER — Encounter: Payer: Self-pay | Admitting: Certified Nurse Midwife

## 2018-03-02 ENCOUNTER — Other Ambulatory Visit: Payer: Self-pay

## 2018-03-02 VITALS — BP 110/80 | HR 68 | Resp 16 | Ht 65.0 in

## 2018-03-02 DIAGNOSIS — N951 Menopausal and female climacteric states: Secondary | ICD-10-CM | POA: Diagnosis not present

## 2018-03-02 DIAGNOSIS — Z8639 Personal history of other endocrine, nutritional and metabolic disease: Secondary | ICD-10-CM

## 2018-03-02 DIAGNOSIS — Z01419 Encounter for gynecological examination (general) (routine) without abnormal findings: Secondary | ICD-10-CM | POA: Diagnosis not present

## 2018-03-02 DIAGNOSIS — N898 Other specified noninflammatory disorders of vagina: Secondary | ICD-10-CM

## 2018-03-02 NOTE — Progress Notes (Signed)
57 y.o. [id number] Married  Caucasian Fe here for annual exam. Menopausal no HRT. Has restarted Effexor and having hot flashes again and feeling better, now. Sees Sharlet Salina PCP for management of Synthroid/Effexor. Sees Cardiology for Lipid management and medication. Having issues with varicose veins and will be seeing vascular surgeon for consult and evaluation.. Occasional vaginal dryness, but no issues. Some decrease libido, but working with spouse regarding. Did not have mammogram follow up as indicated on last mammogram report. Patient will schedule today. "Just forgot". No other health issues today.  No LMP recorded. Patient has had a hysterectomy.          Sexually active: Yes.    The current method of family planning is status post hysterectomy.    Exercising: Yes.    walking, biking Smoker:  no  Review of Systems  Constitutional: Negative.   HENT: Negative.   Eyes: Negative.   Respiratory: Negative.   Cardiovascular: Negative.   Gastrointestinal: Negative.   Genitourinary: Negative.   Musculoskeletal: Negative.   Skin: Negative.   Neurological: Negative.   Endo/Heme/Allergies: Negative.   Psychiatric/Behavioral: Negative.     Health Maintenance: Pap:  01-07-16 neg History of Abnormal Pap: yes MMG:  05-25-16 bilateral & left breast u/s category b birads 3:prob benign, f/u recommended Self Breast exams: yes Colonoscopy:  2015 f/u 33yrs BMD:   none TDaP:  UTD Shingles: no Pneumonia: no Hep C and HIV: hep c neg 2016 Labs: no   reports that she has never smoked. She has never used smokeless tobacco. She reports that she drinks about 18.0 standard drinks of alcohol per week. She reports that she does not use drugs.  Past Medical History:  Diagnosis Date  . Abnormal Pap smear of cervix    36yrs ago  . Classic migraine 09/18/2013  . Diplopia 09/18/2013  . Diverticulitis   . Generalized headaches   . Hyperlipidemia   . Hypertension     Past Surgical History:   Procedure Laterality Date  . ABDOMINAL HYSTERECTOMY    . BREAST SURGERY     reduction  . CERVICAL CONE BIOPSY     51yrs ago  . CESAREAN SECTION     x 2  . LAPAROSCOPIC APPENDECTOMY N/A 07/31/2014   Procedure: APPENDECTOMY LAPAROSCOPIC;  Surgeon: Ovidio Kin, MD;  Location: [facility];  Service: General;  Laterality: N/A;  . tendon surgery on elbow    . TONSILLECTOMY    . TUBAL LIGATION      Current Outpatient Medications  Medication Sig Dispense Refill  . amLODipine (NORVASC) 5 MG tablet Take 5 mg by mouth daily.    . diphenhydrAMINE (BENADRYL) 25 MG tablet Take 1 tablet (25 mg total) by mouth 3 (three) times daily. Take one tablet three times daily for two days 10 tablet 0  . Evolocumab (REPATHA SURECLICK) 140 MG/ML SOAJ Inject 1 Dose into the skin every 14 (fourteen) days. 6 pen 3  . ezetimibe (ZETIA) 10 MG tablet Take 1 tablet (10 mg total) by mouth daily. 90 tablet 0  . hydrochlorothiazide (HYDRODIURIL) 25 MG tablet TAKE 1 TABLET(25 MG) BY MOUTH DAILY 90 tablet 0  . levothyroxine (SYNTHROID, LEVOTHROID) 50 MCG tablet Take 1 tablet (50 mcg total) by mouth daily before breakfast. 90 tablet 0  . potassium chloride (K-DUR) 10 MEQ tablet TAKE 1 TABLET(10 MEQ) BY MOUTH DAILY (Patient taking differently: Take 10 mEq by mouth daily. ) 90 tablet 1  . topiramate (TOPAMAX) 25 MG tablet TAKE 1 TABLET(25 MG)  BY MOUTH DAILY (Patient taking differently: Take 25 mg by mouth at bedtime. ) 90 tablet 3  . venlafaxine XR (EFFEXOR-XR) 75 MG 24 hr capsule TAKE 1 CAPSULE(75 MG) BY MOUTH DAILY WITH BREAKFAST 90 capsule 3   No current facility-administered medications for this visit.     Family History  Problem Relation Age of Onset  . Prostate cancer Father   . Heart disease Father 50       CABGs x 3  . Hypertension Father   . Hyperlipidemia Sister   . Hypertension Sister   . Renal cancer Sister   . Other Sister        bile duct cancer  . Alcohol abuse Brother   . Diabetes Brother        PVD  .  Hypertension Brother   . Cancer Brother   . Hypertension Brother   . Hypertension Brother   . Ovarian cancer Maternal Grandmother     ROS:  Pertinent items are noted in HPI.  Otherwise, a comprehensive ROS was negative.  Exam:   BP 110/80   Pulse 68   Resp 16   Ht 5\' 5"  (1.651 m)   BMI 27.96 kg/m  Height: 5\' 5"  (165.1 cm) Ht Readings from Last 3 Encounters:  03/02/18 5\' 5"  (1.651 m)  01/05/18 5\' 5"  (1.651 m)  11/30/17 5\' 5"  (1.651 m)    General appearance: alert, cooperative and appears stated age Head: Normocephalic, without obvious abnormality, atraumatic Neck: no adenopathy, supple, symmetrical, trachea midline and thyroid normal to inspection and palpation Lungs: clear to auscultation bilaterally Breasts: normal appearance, no masses or tenderness, No nipple retraction or dimpling, No nipple discharge or bleeding, No axillary or supraclavicular adenopathy, reduction scarring Heart: regular rate and rhythm Abdomen: soft, non-tender; no masses,  no organomegaly Extremities: extremities normal, atraumatic, no cyanosis or edema Skin: Skin color, texture, turgor normal. No rashes or lesions Lymph nodes: Cervical, supraclavicular, and axillary nodes normal. No abnormal inguinal nodes palpated Neurologic: Grossly normal   Pelvic: External genitalia:  no lesions, normal appearance              Urethra:  normal appearing urethra with no masses, tenderness or lesions              Bartholin's and Skene's: normal                 Vagina: normal appearing vagina with normal color and discharge good moisture, no lesions, dryness noted only at introital area              Cervix: absent              Pap taken: No. Bimanual Exam:  Uterus:  uterus absent              Adnexa: normal adnexa and no mass, fullness, tenderness               Rectovaginal: Confirms               Anus:  normal sphincter tone, no lesions  Chaperone present: yes  A:  Well Woman with normal exam  Menopausal  s/pTAH for bleeding ovaries retained  Depression/anxiety, hypertension, cholesterol with MD management  Mammogram over due and did not do 6 month follow up of cysts noted  Vaginal dryness introital area only  P:   Reviewed health and wellness pertinent to exam  Continue follow up with PCP as indicated  Stressed importance of mammogram yearly and follow  up per results needed to evaluation cysts  Discussed vaginal findings and Olive or coconut oil use for comfort. Questions addressed.  Pap smear: no   counseled on breast self exam, mammography screening, feminine hygiene, adequate intake of calcium and vitamin D, diet and exercise  return annually or prn  An After Visit Summary was printed and given to the patient.

## 2018-03-02 NOTE — Patient Instructions (Signed)

## 2018-03-05 ENCOUNTER — Other Ambulatory Visit: Payer: Self-pay | Admitting: Internal Medicine

## 2018-03-05 ENCOUNTER — Ambulatory Visit (INDEPENDENT_AMBULATORY_CARE_PROVIDER_SITE_OTHER): Payer: PRIVATE HEALTH INSURANCE | Admitting: Internal Medicine

## 2018-03-05 ENCOUNTER — Encounter: Payer: Self-pay | Admitting: Internal Medicine

## 2018-03-05 VITALS — BP 114/74 | HR 88 | Ht 65.0 in | Wt 168.0 lb

## 2018-03-05 DIAGNOSIS — Z789 Other specified health status: Secondary | ICD-10-CM | POA: Diagnosis not present

## 2018-03-05 DIAGNOSIS — Z8342 Family history of familial hypercholesterolemia: Secondary | ICD-10-CM | POA: Diagnosis not present

## 2018-03-05 DIAGNOSIS — N6320 Unspecified lump in the left breast, unspecified quadrant: Secondary | ICD-10-CM

## 2018-03-05 DIAGNOSIS — Z1231 Encounter for screening mammogram for malignant neoplasm of breast: Secondary | ICD-10-CM

## 2018-03-05 DIAGNOSIS — E782 Mixed hyperlipidemia: Secondary | ICD-10-CM | POA: Diagnosis not present

## 2018-03-05 MED ORDER — EZETIMIBE 10 MG PO TABS: 10.0000 mg | ORAL_TABLET | Freq: Every day | ORAL | 11 refills | Status: DC

## 2018-03-05 NOTE — Patient Instructions (Signed)
Medication Instructions:  Continue current medications  Your zetia has been refilled.   If you need a refill on your cardiac medications before your next appointment, please call your pharmacy.   Lab work: FASTING lab work in 6 months (prior to next visit with Dr. Rennis Golden) If you have labs (blood work) drawn today and your tests are completely normal, you will receive your results only by: Marland Kitchen MyChart Message (if you have MyChart) OR . A paper copy in the mail If you have any lab test that is abnormal or we need to change your treatment, we will call you to review the results.  Follow-Up: At [facility], you and your health needs are our priority.  As part of our continuing mission to provide you with exceptional heart care, we have created designated Provider Care Teams.  These Care Teams include your primary Cardiologist (physician) and Advanced Practice Providers (APPs -  Physician Assistants and Nurse Practitioners) who all work together to provide you with the care you need, when you need it. . You will need a follow up appointment in 6 months with Dr. Rennis Golden (Lipid Clinic).  Please call our office 2 months in advance to schedule this appointment.    Any Other Special Instructions Will Be Listed Below (If Applicable).

## 2018-03-05 NOTE — Progress Notes (Addendum)
Chief Complaint:  Follow-up dyslipidemia  Primary Care Physician: Margaree Mackintosh, MD  HPI:  [name] [name] is a 57 y.o. female who is being seen today for the evaluation of dyslipidemia and statin intolerance at the request of Baxley, Luanna Cole, MD. This is a is a 57 year old female patient of Dr. Lenord Fellers, who is kindly referred to me for evaluation of marked dyslipidemia.  She is previously seen my partner Dr. Antoine Poche, for evaluation of dyslipidemia.  In the past he is ordered a coronary artery calcium score which was more performed in November 2017.  This demonstrated 0 coronary artery calcification.  While this is reassuring, she is at significant increased risk based on family history.  Of note her family history is significant for premature onset heart disease in her father who had MI and numerous bypasses starting in his late 38s.  She also had a brother with significant PAD who died prematurely.  There is a sister who had early onset heart disease and 2 other brothers who are on statin therapy for dyslipidemia.  This pattern is suggestive of familial hyperlipidemia.  In addition, her most recent lipid profile showed total cholesterol of 295, HDL 66, triglycerides 172 and LDL-C 195.  She reports eating a very healthy diet, in fact a number of years ago she went completely vegetarian for at least one year.  Not only does she feel like it did not improve her lipid profile, she says she thinks her cholesterol actually was higher.  She is gone back to eating red meats once or twice every couple weeks, and does eat shellfish however once or twice a week and eats about 4 eggs per week.  Most of her diet consists of vegetables and fruits.  Unfortunately she has been statin intolerant as well.  She is been on a number of medications in the past including atorvastatin, rosuvastatin, simvastatin and pravastatin at low and high doses.  Although she has not had myalgias, she has had significant cognitive  impairment which has improved with discontinuing the medications fairly promptly.  She was referred for evaluation of possible PCSK9 inhibitor therapy.  11/30/2017  [name] [name] returns today for follow-up.  She seems to be doing well on Repatha.  Unfortunately her last injection she injected a little to anterior of the thigh and probably had a blood vessel.  This caused some pain and she redrew the needle before it was completely injected.  She would not likely get the full dose.  Despite this she is been doing well and is had a very good reduction in cholesterol.  Her total cholesterol now is only 220 with LDL 119.  Although this is not the 65% reduction we would expect, she was also on ezetimibe and for some reason was told to discontinue this.  I had intended her to continue on ezetimibe with this.  Subsequently she is restarted ezetimibe.  She denies any cognitive impairments.  03/05/2018  [name] [name] is seen today in follow-up.  She is very pleased with her control dyslipidemia.  She is tolerating Repatha and was injecting it into her abdomen.  Her total cholesterols come down significantly to 156 with HDL 61 and LDL 45.  Triglycerides are fluctuating and are elevated at 249 today, but have been up and down.  Overall her cardiovascular risk is reduced significantly on this regimen and she is additionally taking ezetimibe.  She reports her medications are well-tolerated and is not having any the side effects she  had with the statins.  This is very aggressive therapy considering she had no detectable coronary disease although does have a strong family history of heart disease.  PMHx:  Past Medical History:  Diagnosis Date  . Abnormal Pap smear of cervix    79yrs ago  . Classic migraine 09/18/2013  . Diplopia 09/18/2013  . Diverticulitis   . Generalized headaches   . Hyperlipidemia   . Hypertension     Past Surgical History:  Procedure Laterality Date  . ABDOMINAL HYSTERECTOMY    . BREAST  SURGERY     reduction  . CERVICAL CONE BIOPSY     71yrs ago  . CESAREAN SECTION     x 2  . LAPAROSCOPIC APPENDECTOMY N/A 07/31/2014   Procedure: APPENDECTOMY LAPAROSCOPIC;  Surgeon: Ovidio Kin, MD;  Location: [facility];  Service: General;  Laterality: N/A;  . tendon surgery on elbow    . TONSILLECTOMY    . TUBAL LIGATION      FAMHx:  Family History  Problem Relation Age of Onset  . Prostate cancer Father   . Heart disease Father 50       CABGs x 3  . Hypertension Father   . Hyperlipidemia Sister   . Hypertension Sister   . Renal cancer Sister   . Other Sister        bile duct cancer  . Alcohol abuse Brother   . Diabetes Brother        PVD  . Hypertension Brother   . Cancer Brother   . Hypertension Brother   . Hypertension Brother   . Ovarian cancer Maternal Grandmother     SOCHx:   reports that she has never smoked. She has never used smokeless tobacco. She reports that she drinks about 18.0 standard drinks of alcohol per week. She reports that she does not use drugs.  ALLERGIES:  Allergies  Allergen Reactions  . Statins Other (See Comments)    Cognitive impairment - Atorvastatin, Rosuvastatin, Pravastatin, Simvastatin (high and low doses)  . Ramipril Cough  . Sulfa Antibiotics Other (See Comments)    Itching.  . Yellow Jacket Venom [Bee Venom] Hives, Swelling and Rash    ROS: Pertinent items noted in HPI and remainder of comprehensive ROS otherwise negative.  HOME MEDS: Current Outpatient Medications on File Prior to Visit  Medication Sig Dispense Refill  . amLODipine (NORVASC) 5 MG tablet Take 5 mg by mouth daily.    . Evolocumab (REPATHA SURECLICK) 140 MG/ML SOAJ Inject 1 Dose into the skin every 14 (fourteen) days. 6 pen 3  . ezetimibe (ZETIA) 10 MG tablet Take 1 tablet (10 mg total) by mouth daily. 90 tablet 0  . hydrochlorothiazide (HYDRODIURIL) 25 MG tablet TAKE 1 TABLET(25 MG) BY MOUTH DAILY 90 tablet 0  . levothyroxine (SYNTHROID, LEVOTHROID) 50 MCG  tablet Take 1 tablet (50 mcg total) by mouth daily before breakfast. 90 tablet 0  . potassium chloride (K-DUR) 10 MEQ tablet TAKE 1 TABLET(10 MEQ) BY MOUTH DAILY (Patient taking differently: Take 10 mEq by mouth daily. ) 90 tablet 1  . topiramate (TOPAMAX) 25 MG tablet TAKE 1 TABLET(25 MG) BY MOUTH DAILY (Patient taking differently: Take 25 mg by mouth at bedtime. ) 90 tablet 3  . venlafaxine XR (EFFEXOR-XR) 75 MG 24 hr capsule TAKE 1 CAPSULE(75 MG) BY MOUTH DAILY WITH BREAKFAST 90 capsule 3   No current facility-administered medications on file prior to visit.     LABS/IMAGING: No results found for this or any  previous visit (from the past 48 hour(s)). No results found.  LIPID PANEL:    Component Value Date/Time   CHOL 156 03/01/2018 0936   TRIG 249 (H) 03/01/2018 0936   HDL 61 03/01/2018 0936   CHOLHDL 2.6 03/01/2018 0936   CHOLHDL 4.5 05/23/2017 1124   VLDL 70 (H) 12/12/2016 1006   LDLCALC 45 03/01/2018 0936   LDLCALC 195 (H) 05/23/2017 1124   LDLDIRECT 130.7 11/27/2008 1141    WEIGHTS: Wt Readings from Last 3 Encounters:  03/05/18 168 lb (76.2 kg)  11/30/17 168 lb (76.2 kg)  09/19/17 166 lb (75.3 kg)    VITALS: BP 114/74   Pulse 88   Ht 5\' 5"  (1.651 m)   Wt 168 lb (76.2 kg)   SpO2 96%   BMI 27.96 kg/m   EXAM: Deferred  EKG: Deferred  ASSESSMENT: 1. 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 No CAC (03/2016) 3. Strong family history of premature CAD in mother, brothers and sister 17. Statin intolerance  PLAN: 1.   [name] [name] has had significant reduction in her cholesterol on both Repatha and ezetimibe.  This is well-tolerated unlike statins which she had previously had significant side effects 4.  Although she had no coronary artery calcification that was detected she does have a strong history of premature coronary disease in her family, particularly in the female side, and is very happy about what we have been able to  achieve.  Her triglycerides are an additional moving target.  Diet seems to be fairly healthy low in saturated fats.  We will continue to monitor this is some of it may be due to particle shifting.  Plan follow-up in 6 months with a repeat lipid profile.  Chrystie Nose, MD, [facility], FACP  [place]  [facility]  Medical Director of the Advanced Lipid Disorders &  Cardiovascular Risk Reduction Clinic Diplomate of the American Board of Clinical Lipidology Attending Cardiologist  Direct Dial: [phone]  Fax: [phone]  Website:  [email or web address] Nicely Shaela Boer 03/05/2018, 8:09 AM

## 2018-03-06 ENCOUNTER — Ambulatory Visit: Payer: PRIVATE HEALTH INSURANCE

## 2018-03-07 ENCOUNTER — Ambulatory Visit
Admission: RE | Admit: 2018-03-07 | Discharge: 2018-03-07 | Disposition: A | Discharge status: Home / Self Care | Payer: PRIVATE HEALTH INSURANCE | Source: Ambulatory Visit | Attending: Internal Medicine | Admitting: Internal Medicine

## 2018-03-07 ENCOUNTER — Telehealth: Payer: Self-pay | Admitting: *Deleted

## 2018-03-07 DIAGNOSIS — N6320 Unspecified lump in the left breast, unspecified quadrant: Secondary | ICD-10-CM

## 2018-03-07 NOTE — Telephone Encounter (Signed)
Opened in error

## 2018-03-08 ENCOUNTER — Ambulatory Visit (HOSPITAL_COMMUNITY)
Admission: RE | Admit: 2018-03-08 | Discharge: 2018-03-08 | Disposition: A | Discharge status: Home / Self Care | Payer: PRIVATE HEALTH INSURANCE | Source: Ambulatory Visit | Attending: Vascular Surgery | Admitting: Vascular Surgery

## 2018-03-08 ENCOUNTER — Ambulatory Visit (INDEPENDENT_AMBULATORY_CARE_PROVIDER_SITE_OTHER): Payer: PRIVATE HEALTH INSURANCE | Admitting: Vascular Surgery

## 2018-03-08 ENCOUNTER — Encounter: Payer: Self-pay | Admitting: Vascular Surgery

## 2018-03-08 ENCOUNTER — Encounter: Payer: PRIVATE HEALTH INSURANCE | Admitting: Vascular Surgery

## 2018-03-08 VITALS — BP 121/85 | HR 86 | Resp 18 | Ht 65.0 in | Wt 168.0 lb

## 2018-03-08 DIAGNOSIS — I83813 Varicose veins of bilateral lower extremities with pain: Secondary | ICD-10-CM | POA: Diagnosis not present

## 2018-03-08 DIAGNOSIS — I83893 Varicose veins of bilateral lower extremities with other complications: Secondary | ICD-10-CM | POA: Diagnosis not present

## 2018-03-08 NOTE — Progress Notes (Signed)
REASON FOR CONSULT:    Painful varicose veins bilaterally.  Patient is self-referred.  HPI:   [name] [name] is a pleasant 57 y.o. 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  She has undergone previous laser ablation of the left great saphenous vein and left small saphenous vein in 2006.  She has developed recurrent varicose veins on the left and also varicose veins on the right.  She experiences significant aching and heaviness in both lower extremities which is aggravated by standing and alleviated somewhat with elevation.  She has not been wearing compression stockings.  She takes ibuprofen as needed for pain.  She is unaware of any previous history of DVT or phlebitis.  She has not had any significant problems with swelling.  Past Medical History:  Diagnosis Date  . Abnormal Pap smear of cervix    37yrs ago  . Classic migraine 09/18/2013  . Diplopia 09/18/2013  . Diverticulitis   . Generalized headaches   . Hyperlipidemia   . Hypertension     Family History  Problem Relation Age of Onset  . Prostate cancer Father   . Heart disease Father 50       CABGs x 3  . Hypertension Father   . Hyperlipidemia Sister   . Hypertension Sister   . Renal cancer Sister   . Other Sister        bile duct cancer  . Alcohol abuse Brother   . Diabetes Brother        PVD  . Hypertension Brother   . Cancer Brother   . Hypertension Brother   . Hypertension Brother   . Ovarian cancer Maternal Grandmother     SOCIAL HISTORY: She is not a smoker. Social History   Socioeconomic History  . Marital status: Married    Spouse name: Not on file  . Number of children: 2  . Years of education: college  . Highest education level: Not on file  Occupational History  . Occupation: Tree surgeon  Social Needs  . Financial resource strain: Not on file  . Food insecurity:    Worry: Not on file    Inability: Not on file  . Transportation needs:    Medical: Not  on file    Non-medical: Not on file  Tobacco Use  . Smoking status: Never Smoker  . Smokeless tobacco: Never Used  Substance and Sexual Activity  . Alcohol use: Yes    Alcohol/week: 18.0 standard drinks    Types: 18 Standard drinks or equivalent per week  . Drug use: No  . Sexual activity: Yes    Birth control/protection: Surgical    Comment: hysterectomy  Lifestyle  . Physical activity:    Days per week: Not on file    Minutes per session: Not on file  . Stress: Not on file  Relationships  . Social connections:    Talks on phone: Not on file    Gets together: Not on file    Attends religious service: Not on file    Active member of club or organization: Not on file    Attends meetings of clubs or organizations: Not on file    Relationship status: Not on file  . Intimate partner violence:    Fear of current or ex partner: Not on file    Emotionally abused: Not on file    Physically abused: Not on file    Forced sexual activity: Not on file  Other Topics  Concern  . Not on file  Social History Narrative   Lives at home w/ her husband   Right-handed   Caffeine: weekly coffee and daily hot tea    Allergies  Allergen Reactions  . Statins Other (See Comments)    Cognitive impairment - Atorvastatin, Rosuvastatin, Pravastatin, Simvastatin (high and low doses)  . Ramipril Cough  . Sulfa Antibiotics Other (See Comments)    Itching.  . Yellow Jacket Venom [Bee Venom] Hives, Swelling and Rash    Current Outpatient Medications  Medication Sig Dispense Refill  . amLODipine (NORVASC) 5 MG tablet Take 5 mg by mouth daily.    . Evolocumab (REPATHA SURECLICK) 140 MG/ML SOAJ Inject 1 Dose into the skin every 14 (fourteen) days. 6 pen 3  . ezetimibe (ZETIA) 10 MG tablet Take 1 tablet (10 mg total) by mouth daily. 30 tablet 11  . hydrochlorothiazide (HYDRODIURIL) 25 MG tablet TAKE 1 TABLET(25 MG) BY MOUTH DAILY 90 tablet 0  . levothyroxine (SYNTHROID, LEVOTHROID) 50 MCG tablet Take  1 tablet (50 mcg total) by mouth daily before breakfast. 90 tablet 0  . potassium chloride (K-DUR) 10 MEQ tablet TAKE 1 TABLET(10 MEQ) BY MOUTH DAILY (Patient taking differently: Take 10 mEq by mouth daily. ) 90 tablet 1  . topiramate (TOPAMAX) 25 MG tablet TAKE 1 TABLET(25 MG) BY MOUTH DAILY (Patient taking differently: Take 25 mg by mouth at bedtime. ) 90 tablet 3  . venlafaxine XR (EFFEXOR-XR) 75 MG 24 hr capsule TAKE 1 CAPSULE(75 MG) BY MOUTH DAILY WITH BREAKFAST 90 capsule 3   No current facility-administered medications for this visit.     REVIEW OF SYSTEMS:  [X]  denotes positive finding, [ ]  denotes negative finding Cardiac  Comments:  Chest pain or chest pressure:    Shortness of breath upon exertion:    Short of breath when lying flat:    Irregular heart rhythm:        Vascular    Pain in calf, thigh, or hip brought on by ambulation:    Pain in feet at night that wakes you up from your sleep:     Blood clot in your veins:    Leg swelling:         Pulmonary    Oxygen at home:    Productive cough:     Wheezing:         Neurologic    Sudden weakness in arms or legs:     Sudden numbness in arms or legs:     Sudden onset of difficulty speaking or slurred speech:    Temporary loss of vision in one eye:     Problems with dizziness:         Gastrointestinal    Blood in stool:     Vomited blood:         Genitourinary    Burning when urinating:     Blood in urine:        Psychiatric    Major depression:         Hematologic    Bleeding problems:    Problems with blood clotting too easily:        Skin    Rashes or ulcers:        Constitutional    Fever or chills:     PHYSICAL EXAM:   Vitals:   03/08/18 1348  BP: 121/85  Pulse: 86  Resp: 18  SpO2: 90%  Weight: 168 lb (76.2 kg)  Height: 5\' 5"  (1.651  m)   GENERAL: The patient is a well-nourished female, in no acute distress. The vital signs are documented above. CARDIAC: There is a regular rate and rhythm.   VASCULAR: I do not detect carotid bruits. She has palpable pedal pulses. She has significantly dilated varicose veins along her lateral left thigh and left leg extending anteriorly.  She also has significant varicosities in her posterior left calf.  On the right side she has varicose veins on the medial anterior aspect of her right leg and some varicose veins in her posterior right calf.  She also has some telangiectasias bilaterally. I did look at her right small saphenous vein and this was fairly small.  Also examined the right great saphenous vein and there was no significant reflux in the thigh noted. PULMONARY: There is good air exchange bilaterally without wheezing or rales. ABDOMEN: Soft and non-tender with normal pitched bowel sounds.  MUSCULOSKELETAL: There are no major deformities or cyanosis. NEUROLOGIC: No focal weakness or paresthesias are detected. SKIN: There are no ulcers or rashes noted. PSYCHIATRIC: The patient has a normal affect.  DATA:    VENOUS DUPLEX: I have independently interpreted her venous duplex scan today.  On the right side there is no evidence of DVT or superficial thrombophlebitis.  On the right side there is no evidence of deep venous reflux.  There is superficial venous reflux involving the great saphenous vein in the mid calf and also in the small saphenous vein on the right.  On the left side there is no evidence of DVT or superficial thrombophlebitis.  There is no deep venous reflux.  The left great saphenous vein and small saphenous vein have been ablated.  ASSESSMENT & PLAN:   CHRONIC VENOUS INSUFFICIENCY: This patient has painful varicose veins of both lower extremities.  She is previously had the left great saphenous and small saphenous veins ablated.  She does not have any significant deep venous reflux on the left.  I think she would potentially be a candidate for stab phlebectomies of the enlarged painful varicose veins of the left lower extremity  but I recommended we initially start with conservative treatment including leg elevation, thigh-high compression stockings with a gradient of 20 to 30 mmHg, exercise, and avoiding prolonged sitting and standing.  On the right side likewise I have recommended conservative treatment for now.  She does not have significant reflux in the right great saphenous vein in the thigh only in the mid calf on the right.  The small saphenous vein is fairly small so I do not think she is a candidate for endovenous laser ablation of this at this point.  She does have some painful varicose veins on the right and would also require 10-20 stab phlebectomies on the right if her symptoms do not improve with conservative treatment.  I will plan on seeing her back in 3 months.  She knows to call sooner if she has problems.   Waverly Ferrari [facility] [phone]

## 2018-04-13 ENCOUNTER — Other Ambulatory Visit: Payer: Self-pay | Admitting: Internal Medicine

## 2018-05-10 ENCOUNTER — Telehealth: Payer: Self-pay | Admitting: Internal Medicine

## 2018-05-10 NOTE — Telephone Encounter (Signed)
New Message   Pt c/o medication issue:  1. Name of Medication: Evolocumab (REPATHA SURECLICK) 140 MG/ML SOAJ    2. How are you currently taking this medication (dosage and times per day)?   3. Are you having a reaction (difficulty breathing--STAT)?   4. What is your medication issue? Patient is calling because she takes repatha and she is concern that since her insurance change it will not be covered. She does not want to reach out to the insurance vendor to see if it will be covered with her new insurance. She feels that its Dr. Blanchie Dessert office responsibility. She believes that [employer] is handling both her medical and pharmacy service. Please call them to confirm coverage and whether or not prior authorization is needed at  [phone]

## 2018-05-11 ENCOUNTER — Other Ambulatory Visit: Payer: BLUE CROSS/BLUE SHIELD | Admitting: Internal Medicine

## 2018-05-11 DIAGNOSIS — F329 Major depressive disorder, single episode, unspecified: Secondary | ICD-10-CM

## 2018-05-11 DIAGNOSIS — E039 Hypothyroidism, unspecified: Secondary | ICD-10-CM

## 2018-05-11 DIAGNOSIS — E782 Mixed hyperlipidemia: Secondary | ICD-10-CM | POA: Diagnosis not present

## 2018-05-11 DIAGNOSIS — R7302 Impaired glucose tolerance (oral): Secondary | ICD-10-CM

## 2018-05-11 DIAGNOSIS — I10 Essential (primary) hypertension: Secondary | ICD-10-CM

## 2018-05-11 DIAGNOSIS — F32A Depression, unspecified: Secondary | ICD-10-CM

## 2018-05-11 DIAGNOSIS — Z0000 Encounter for general adult medical examination without abnormal findings: Secondary | ICD-10-CM

## 2018-05-11 NOTE — Telephone Encounter (Signed)
LMTCB to discuss change in insurance and Repatha coverage.   Has her pharmacy changed? What did her plan formulary state about Repatha coverage? New ID number? Would also need BIN, Rx Group, PCN for a prior auth if one is needed

## 2018-05-12 LAB — HEMOGLOBIN A1C
Hgb A1c MFr Bld: 5.9 % of total Hgb — ABNORMAL HIGH (ref <5.7)
MEAN PLASMA GLUCOSE: 123 (calc)
eAG (mmol/L): 6.8 (calc)

## 2018-05-12 LAB — CBC WITH DIFFERENTIAL/PLATELET
ABSOLUTE MONOCYTES: 308 {cells}/uL (ref 200–950)
BASOS ABS: 28 {cells}/uL (ref 0–200)
Basophils Relative: 0.7 %
EOS PCT: 1.7 %
Eosinophils Absolute: 68 cells/uL (ref 15–500)
HEMATOCRIT: 41.4 % (ref 35.0–45.0)
HEMOGLOBIN: 14.3 g/dL (ref 11.7–15.5)
LYMPHS ABS: 1232 {cells}/uL (ref 850–3900)
MCH: 31.2 pg (ref 27.0–33.0)
MCHC: 34.5 g/dL (ref 32.0–36.0)
MCV: 90.2 fL (ref 80.0–100.0)
MPV: 10.6 fL (ref 7.5–12.5)
Monocytes Relative: 7.7 %
NEUTROS PCT: 59.1 %
Neutro Abs: 2364 cells/uL (ref 1500–7800)
Platelets: 189 10*3/uL (ref 140–400)
RBC: 4.59 10*6/uL (ref 3.80–5.10)
RDW: 13.1 % (ref 11.0–15.0)
Total Lymphocyte: 30.8 %
WBC: 4 10*3/uL (ref 3.8–10.8)

## 2018-05-12 LAB — COMPLETE METABOLIC PANEL WITH GFR
AG Ratio: 2.1 (calc) (ref 1.0–2.5)
ALKALINE PHOSPHATASE (APISO): 92 U/L (ref 33–130)
ALT: 25 U/L (ref 6–29)
AST: 21 U/L (ref 10–35)
Albumin: 4.6 g/dL (ref 3.6–5.1)
BILIRUBIN TOTAL: 0.5 mg/dL (ref 0.2–1.2)
BUN: 24 mg/dL (ref 7–25)
CO2: 29 mmol/L (ref 20–32)
CREATININE: 0.81 mg/dL (ref 0.50–1.05)
Calcium: 9.7 mg/dL (ref 8.6–10.4)
Chloride: 106 mmol/L (ref 98–110)
GFR, EST AFRICAN AMERICAN: 93 mL/min/{1.73_m2} (ref >=60)
GFR, Est Non African American: 81 mL/min/{1.73_m2} (ref >=60)
GLUCOSE: 103 mg/dL — AB (ref 65–99)
Globulin: 2.2 g/dL (calc) (ref 1.9–3.7)
Potassium: 3.9 mmol/L (ref 3.5–5.3)
Sodium: 143 mmol/L (ref 135–146)
Total Protein: 6.8 g/dL (ref 6.1–8.1)

## 2018-05-12 LAB — LIPID PANEL
Cholesterol: 172 mg/dL (ref <200)
HDL: 68 mg/dL (ref >50)
LDL CHOLESTEROL (CALC): 77 mg/dL
NON-HDL CHOLESTEROL (CALC): 104 mg/dL (ref <130)
TRIGLYCERIDES: 163 mg/dL — AB (ref <150)
Total CHOL/HDL Ratio: 2.5 (calc) (ref <5.0)

## 2018-05-12 LAB — VITAMIN D 25 HYDROXY (VIT D DEFICIENCY, FRACTURES): VIT D 25 HYDROXY: 33 ng/mL (ref 30–100)

## 2018-05-12 LAB — TSH: TSH: 1.73 mIU/L (ref 0.40–4.50)

## 2018-05-13 ENCOUNTER — Other Ambulatory Visit: Payer: Self-pay | Admitting: Internal Medicine

## 2018-05-14 ENCOUNTER — Encounter: Payer: PRIVATE HEALTH INSURANCE | Admitting: Internal Medicine

## 2018-05-15 ENCOUNTER — Encounter: Payer: Self-pay | Admitting: Internal Medicine

## 2018-05-15 ENCOUNTER — Telehealth: Payer: Self-pay | Admitting: *Deleted

## 2018-05-15 ENCOUNTER — Ambulatory Visit (INDEPENDENT_AMBULATORY_CARE_PROVIDER_SITE_OTHER): Payer: BLUE CROSS/BLUE SHIELD | Admitting: Internal Medicine

## 2018-05-15 VITALS — BP 110/88 | HR 82 | Ht 65.0 in | Wt 174.0 lb

## 2018-05-15 DIAGNOSIS — E039 Hypothyroidism, unspecified: Secondary | ICD-10-CM | POA: Diagnosis not present

## 2018-05-15 DIAGNOSIS — R7302 Impaired glucose tolerance (oral): Secondary | ICD-10-CM | POA: Diagnosis not present

## 2018-05-15 DIAGNOSIS — Z789 Other specified health status: Secondary | ICD-10-CM | POA: Diagnosis not present

## 2018-05-15 DIAGNOSIS — Z0000 Encounter for general adult medical examination without abnormal findings: Secondary | ICD-10-CM

## 2018-05-15 DIAGNOSIS — Z8659 Personal history of other mental and behavioral disorders: Secondary | ICD-10-CM

## 2018-05-15 DIAGNOSIS — E782 Mixed hyperlipidemia: Secondary | ICD-10-CM

## 2018-05-15 DIAGNOSIS — Z8669 Personal history of other diseases of the nervous system and sense organs: Secondary | ICD-10-CM

## 2018-05-15 DIAGNOSIS — I10 Essential (primary) hypertension: Secondary | ICD-10-CM

## 2018-05-15 LAB — POCT URINALYSIS DIPSTICK
APPEARANCE: NEGATIVE
Bilirubin, UA: NEGATIVE
Glucose, UA: NEGATIVE
KETONES UA: NEGATIVE
Leukocytes, UA: NEGATIVE
NITRITE UA: NEGATIVE
ODOR: NEGATIVE
PROTEIN UA: NEGATIVE
RBC UA: NEGATIVE
Spec Grav, UA: 1.01 (ref 1.010–1.025)
UROBILINOGEN UA: 0.2 U/dL
pH, UA: 6.5 (ref 5.0–8.0)

## 2018-05-15 MED ORDER — EVOLOCUMAB 140 MG/ML ~~LOC~~ SOAJ: 1.0000 | SUBCUTANEOUS | 3 refills | Status: DC

## 2018-05-15 MED ORDER — EZETIMIBE 10 MG PO TABS: 10.0000 mg | ORAL_TABLET | Freq: Every day | ORAL | 3 refills | Status: DC

## 2018-05-15 MED ORDER — TOPIRAMATE 25 MG PO TABS: ORAL_TABLET | ORAL | 3 refills | Status: DC

## 2018-05-15 MED ORDER — VENLAFAXINE HCL ER 75 MG PO CP24: ORAL_CAPSULE | ORAL | 3 refills | Status: DC

## 2018-05-15 MED ORDER — CIPROFLOXACIN HCL 500 MG PO TABS: 500.0000 mg | ORAL_TABLET | Freq: Two times a day (BID) | ORAL | 0 refills | Status: DC

## 2018-05-15 NOTE — Telephone Encounter (Signed)
Notified patient of refills via MyChart

## 2018-05-15 NOTE — Progress Notes (Signed)
Subjective:    Patient ID: [name] [name], female    DOB: [birth year], 58 y.o.   MRN: [id number]  HPI 58 year old Female for health maintenance exam and evaluation of medical issues.  History of hypertension and hyperlipidemia.  She is statin intolerant and is now being treated at the lipid clinic by Dr. Rennis Golden and staff.  Repatha was recommended.  She is also on Zetia.  Does not play tennis anymore.  Seems to have lost interest in it.  Has gained some weight and she is displeased with that.  In May she weighed 166 pounds.  Now weighs 174 pounds.  Hypothyroidism-treat with same dose of thyroid replacement  History of migraine headaches seen by Dr. Anne Hahn in September.  Is on Topamax 25 mg daily.  History of varicose veins seen by vascular surgery.  In May she became depressed.  She was trying to come off of her Effexor and it caused considerable issues with discontinuation.  She did not want to leave the house.  She is now back on it.  She thinks 75 mg daily is a good dose for her.  She has mild glucose intolerance at 5.9% and needs to work on diet and exercise.  She is going on a boating trip and wants to have antibiotics on hand so Cipro has been prescribed.  History of hypertension treated with amlodipine and HCTZ.    Review of Systems  Constitutional: Negative.   Respiratory: Negative.   Cardiovascular: Negative.   Gastrointestinal: Negative.   Genitourinary: Negative.   Neurological:       History of migraine headaches treated with Topamax  Psychiatric/Behavioral: Negative.        Objective:   Physical Exam Vitals signs reviewed.  Constitutional:      General: She is not in acute distress.    Appearance: Normal appearance. She is not diaphoretic.  HENT:     Head: Normocephalic and atraumatic.     Right Ear: Tympanic membrane, ear canal and external ear normal.     Left Ear: Tympanic membrane, ear canal and external ear normal.     Nose: Nose normal.   Mouth/Throat:     Mouth: Mucous membranes are moist.  Eyes:     General:        Right eye: No discharge.        Left eye: No discharge.     Extraocular Movements: Extraocular movements intact.     Pupils: Pupils are equal, round, and reactive to light.  Neck:     Musculoskeletal: Neck supple.     Vascular: No carotid bruit.     Comments: No thyromegaly Cardiovascular:     Rate and Rhythm: Normal rate and regular rhythm.     Pulses: Normal pulses.     Heart sounds: No murmur.     Comments: Breast without masses Pulmonary:     Effort: Pulmonary effort is normal. No respiratory distress.     Breath sounds: Normal breath sounds. No wheezing.  Abdominal:     General: Bowel sounds are normal.     Palpations: Abdomen is soft. There is no mass.     Tenderness: There is no abdominal tenderness. There is no guarding.  Genitourinary:    Comments: Deferred to GYN Musculoskeletal:     Right lower leg: No edema.     Left lower leg: No edema.  Lymphadenopathy:     Cervical: No cervical adenopathy.  Skin:    General: Skin is warm and  dry.     Findings: No rash.  Neurological:     General: No focal deficit present.     Mental Status: She is alert and oriented to person, place, and time.     Cranial Nerves: No cranial nerve deficit.     Sensory: No sensory deficit.     Motor: No weakness.     Coordination: Coordination normal.     Gait: Gait normal.  Psychiatric:        Mood and Affect: Mood normal.        Behavior: Behavior normal.        Thought Content: Thought content normal.        Judgment: Judgment normal.           Assessment & Plan:  Impaired glucose tolerance-recheck in 6 months.  Watch diet.  Continue to exercise.  History of diverticulitis-Cipro refill for travel at patient request  Anxiety depression-Effexor refilled  Essential hypertension-continue same medications as blood pressures under good control  History of migraine headaches treated with  Topamax  Hyperlipidemia treated with Repatha and Zetia.  Statin intolerant.  Mixed hyperlipidemia.  Hypothyroidism-continue same dose of thyroid replacement  Plan: Follow-up in 6 months with hemoglobin A1c and blood pressure check along with office visit.  Work on diet exercise and weight loss.  Cipro given for boat trip due to history of diverticulitis.

## 2018-05-15 NOTE — Telephone Encounter (Signed)
Received faxed request from [facility] mail order pharmacy to refill zetia & repatha. Rx(s) sent to pharmacy electronically.

## 2018-05-15 NOTE — Telephone Encounter (Signed)
New prescription for topiramate 25 mg e scribed to [facility] pharmacy per request received by fax. I called [facility] [address], spoke wit Fleet Contras and advised she discontinue remaining refills on file. Fleet Contras confirmed she canceled refills.

## 2018-05-15 NOTE — Patient Instructions (Signed)
It was a pleasure to see you today.  Watch diet.  Return in 6 months to follow-up on impaired glucose tolerance and hypertension.  Order given for shingles vaccine.

## 2018-05-16 MED ORDER — AMLODIPINE BESYLATE 5 MG PO TABS: 5.0000 mg | ORAL_TABLET | Freq: Every day | ORAL | 3 refills | Status: DC

## 2018-05-16 MED ORDER — HYDROCHLOROTHIAZIDE 25 MG PO TABS: ORAL_TABLET | ORAL | 3 refills | Status: DC

## 2018-05-16 MED ORDER — LEVOTHYROXINE SODIUM 50 MCG PO TABS: ORAL_TABLET | ORAL | 3 refills | Status: DC

## 2018-05-16 MED ORDER — POTASSIUM CHLORIDE ER 10 MEQ PO TBCR: EXTENDED_RELEASE_TABLET | ORAL | 3 refills | Status: DC

## 2018-05-16 MED ORDER — CIPROFLOXACIN HCL 500 MG PO TABS: 500.0000 mg | ORAL_TABLET | Freq: Two times a day (BID) | ORAL | 0 refills | Status: DC

## 2018-05-17 ENCOUNTER — Telehealth: Payer: Self-pay | Admitting: Internal Medicine

## 2018-05-17 NOTE — Telephone Encounter (Signed)
Prior authorization for Repatha submitted via covermymeds.com  [employer]  (Key: [id number])

## 2018-05-23 NOTE — Telephone Encounter (Signed)
Per [email or web address], patient has been approved for [employer]

## 2018-06-19 ENCOUNTER — Encounter: Payer: Self-pay | Admitting: Neurology

## 2018-08-11 ENCOUNTER — Other Ambulatory Visit: Payer: Self-pay | Admitting: Internal Medicine

## 2018-09-05 ENCOUNTER — Ambulatory Visit: Payer: PRIVATE HEALTH INSURANCE | Admitting: Internal Medicine

## 2018-09-11 ENCOUNTER — Ambulatory Visit: Payer: PRIVATE HEALTH INSURANCE | Admitting: Internal Medicine

## 2018-09-19 LAB — LIPID PANEL
Chol/HDL Ratio: 2.7 ratio (ref 0.0–4.4)
Cholesterol, Total: 160 mg/dL (ref 100–199)
HDL: 60 mg/dL (ref >39)
LDL CALC: 52 mg/dL (ref 0–99)
Triglycerides: 239 mg/dL — ABNORMAL HIGH (ref 0–149)
VLDL Cholesterol Cal: 48 mg/dL — ABNORMAL HIGH (ref 5–40)

## 2018-09-21 ENCOUNTER — Telehealth: Payer: Self-pay | Admitting: Internal Medicine

## 2018-09-21 NOTE — Telephone Encounter (Signed)
insurance was added & updated/ smartphone/ consent/ my chart/ pre reg completed °

## 2018-09-26 ENCOUNTER — Telehealth (INDEPENDENT_AMBULATORY_CARE_PROVIDER_SITE_OTHER): Payer: BLUE CROSS/BLUE SHIELD | Admitting: Internal Medicine

## 2018-09-26 ENCOUNTER — Telehealth: Payer: Self-pay | Admitting: Internal Medicine

## 2018-09-26 ENCOUNTER — Encounter: Payer: Self-pay | Admitting: Internal Medicine

## 2018-09-26 VITALS — BP 129/84 | HR 69 | Ht 65.0 in | Wt 183.0 lb

## 2018-09-26 DIAGNOSIS — E782 Mixed hyperlipidemia: Secondary | ICD-10-CM

## 2018-09-26 DIAGNOSIS — Z8342 Family history of familial hypercholesterolemia: Secondary | ICD-10-CM | POA: Diagnosis not present

## 2018-09-26 DIAGNOSIS — I7789 Other specified disorders of arteries and arterioles: Secondary | ICD-10-CM

## 2018-09-26 DIAGNOSIS — Z789 Other specified health status: Secondary | ICD-10-CM | POA: Diagnosis not present

## 2018-09-26 MED ORDER — EVOLOCUMAB 140 MG/ML ~~LOC~~ SOAJ: 1.0000 | SUBCUTANEOUS | 3 refills | Status: DC

## 2018-09-26 NOTE — Progress Notes (Signed)
Virtual Visit via Video Note   This visit type was conducted due to national recommendations for restrictions regarding the COVID-19 Pandemic (e.g. social distancing) in an effort to limit this patient's exposure and mitigate transmission in our community.  Due to her co-morbid illnesses, this patient is at least at moderate risk for complications without adequate follow up.  This format is felt to be most appropriate for this patient at this time.  All issues noted in this document were discussed and addressed.  A limited physical exam was performed with this format.  Please refer to the patient's chart for her consent to telehealth for [facility].   Evaluation Performed:  Doximity video visit  Date:  09/26/2018   ID:  [name] [name], DOB 1961/01/30, MRN [id number]  Patient Location:  [address] [place] [address]  Provider location:   [address] [place], [place] [address]  PCP:  Margaree Mackintosh, MD  Cardiologist:  No primary care provider on file. Electrophysiologist:  None   Chief Complaint:  No complaints  History of Present Illness:    [name] [name] is a 58 y.o. female who presents via audio/video conferencing for a telehealth visit today.  [name] [name] was seen today for video follow-up.  Overall she is doing well on Repatha and has no significant side effects, compared to the side effect she had on statin therapy.  Please report she can has continued good control of her cholesterol.  Recent labs showed total cholesterol 160, triglycerides 239, HDL 60 and LDL of 52.  Her LDL 10 months ago was 119 with total cholesterol 220.  Her triglycerides have gone up somewhat accordingly, however given the fact she has no demonstrated coronary calcification, I think there is unfortunately little evidence supporting the addition of medications to lower her triglycerides.  While there is strong evidence for the use of Vascepa, and reducing cardiovascular  events, this is in a patient population with known coronary artery disease on top of statin or PCSK9 inhibitor therapy.  There is little evidence of cardiovascular event reduction or mortality benefit in patients with fibrate's.  Based on that, I am recommending dietary interventions including reduction in alcohol intake and weight loss with more exercise which has been an issue in the past several months per her report due to isolating from the COVID-19 pandemic.  The patient does not have symptoms concerning for COVID-19 infection (fever, chills, cough, or new SHORTNESS OF BREATH).    Prior CV studies:   The following studies were reviewed today:  Chart reviewed  PMHx:  Past Medical History:  Diagnosis Date  . Abnormal Pap smear of cervix    34yrs ago  . Classic migraine 09/18/2013  . Diplopia 09/18/2013  . Diverticulitis   . Generalized headaches   . Hyperlipidemia   . Hypertension     Past Surgical History:  Procedure Laterality Date  . ABDOMINAL HYSTERECTOMY    . BREAST SURGERY     reduction  . CERVICAL CONE BIOPSY     20yrs ago  . CESAREAN SECTION     x 2  . LAPAROSCOPIC APPENDECTOMY N/A 07/31/2014   Procedure: APPENDECTOMY LAPAROSCOPIC;  Surgeon: Ovidio Kin, MD;  Location: [facility];  Service: General;  Laterality: N/A;  . REDUCTION MAMMAPLASTY    . tendon surgery on elbow    . TONSILLECTOMY    . TUBAL LIGATION      FAMHx:  Family History  Problem Relation Age of Onset  .  Prostate cancer Father   . Heart disease Father 50       CABGs x 3  . Hypertension Father   . Hyperlipidemia Sister   . Hypertension Sister   . Renal cancer Sister   . Other Sister        bile duct cancer  . Alcohol abuse Brother   . Diabetes Brother        PVD  . Hypertension Brother   . Cancer Brother   . Hypertension Brother   . Hypertension Brother   . Ovarian cancer Maternal Grandmother     SOCHx:   reports that she has never smoked. She has never used smokeless tobacco. She  reports current alcohol use of about 18.0 standard drinks of alcohol per week. She reports that she does not use drugs.  ALLERGIES:  Allergies  Allergen Reactions  . Statins Other (See Comments)    Cognitive impairment - Atorvastatin, Rosuvastatin, Pravastatin, Simvastatin (high and low doses)  . Ramipril Cough  . Sulfa Antibiotics Other (See Comments)    Itching.  . Yellow Jacket Venom [Bee Venom] Hives, Swelling and Rash    MEDS:  Current Meds  Medication Sig  . amLODipine (NORVASC) 5 MG tablet TAKE 1 TABLET BY MOUTH EVERY DAY  . Evolocumab (REPATHA SURECLICK) 140 MG/ML SOAJ Inject 1 Dose into the skin every 14 (fourteen) days.  Marland Kitchen. ezetimibe (ZETIA) 10 MG tablet Take 1 tablet (10 mg total) by mouth daily.  . hydrochlorothiazide (HYDRODIURIL) 25 MG tablet TAKE 1 TABLET(25 MG) BY MOUTH DAILY  . levothyroxine (SYNTHROID, LEVOTHROID) 50 MCG tablet TAKE 1 TABLET(50 MCG) BY MOUTH DAILY BEFORE BREAKFAST  . potassium chloride (K-DUR) 10 MEQ tablet TAKE 1 TABLET(10 MEQ) BY MOUTH DAILY  . topiramate (TOPAMAX) 25 MG tablet TAKE 1 TABLET(25 MG) BY MOUTH DAILY  . venlafaxine XR (EFFEXOR-XR) 75 MG 24 hr capsule TAKE 1 CAPSULE(75 MG) BY MOUTH DAILY WITH BREAKFAST  . [DISCONTINUED] Evolocumab (REPATHA SURECLICK) 140 MG/ML SOAJ Inject 1 Dose into the skin every 14 (fourteen) days.     ROS: Pertinent items noted in HPI and remainder of comprehensive ROS otherwise negative.  Labs/Other Tests and Data Reviewed:    Recent Labs: 05/11/2018: ALT 25; BUN 24; Creat 0.81; Hemoglobin 14.3; Platelets 189; Potassium 3.9; Sodium 143; TSH 1.73   Recent Lipid Panel Lab Results  Component Value Date/Time   CHOL 160 09/19/2018 08:46 AM   TRIG 239 (H) 09/19/2018 08:46 AM   HDL 60 09/19/2018 08:46 AM   CHOLHDL 2.7 09/19/2018 08:46 AM   CHOLHDL 2.5 05/11/2018 09:52 AM   LDLCALC 52 09/19/2018 08:46 AM   LDLCALC 77 05/11/2018 09:52 AM   LDLDIRECT 130.7 11/27/2008 11:41 AM    Wt Readings from Last 3  Encounters:  09/26/18 183 lb (83 kg)  05/15/18 174 lb (78.9 kg)  03/08/18 168 lb (76.2 kg)     Exam:    Vital Signs:  BP 129/84   Pulse 69   Ht 5\' 5"  (1.651 m)   Wt 183 lb (83 kg)   BMI 30.45 kg/m    General appearance: alert, no distress and mildly obese Lungs: No visual respiratory difficulty Abdomen: Mildly obese Extremities: extremities normal, atraumatic, no cyanosis or edema Skin: Skin color, texture, turgor normal. No rashes or lesions Neurologic: Mental status: Alert, oriented, thought content appropriate Psych: Pleasant  ASSESSMENT & PLAN:    1. 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 No CAC (03/2016) 3. Strong family history of  premature CAD in mother, brothers and sister 78. Statin intolerance  Ms. Kato has had a nice improvement in her lipid profile.  LDL is much better controlled on Repatha and she has no significant side effects.  Her triglycerides remain elevated however I would now recommend dietary measures to correct that including decreasing alcohol intake, more aerobic exercise and weight loss.  COVID-19 Education: The signs and symptoms of COVID-19 were discussed with the patient and how to seek care for testing (follow up with PCP or arrange E-visit).  The importance of social distancing was discussed today.  Patient Risk:   After full review of this patients clinical status, I feel that they are at least moderate risk at this time.  Time:   Today, I have spent 15 minutes with the patient with telehealth technology discussing lipid management, dietary education, risk factor modification.     Medication Adjustments/Labs and Tests Ordered: Current medicines are reviewed at length with the patient today.  Concerns regarding medicines are outlined above.   Tests Ordered: Orders Placed This Encounter  Procedures  . Lipid panel    Medication Changes: Meds ordered this encounter  Medications  . Evolocumab  (REPATHA SURECLICK) 140 MG/ML SOAJ    Sig: Inject 1 Dose into the skin every 14 (fourteen) days.    Dispense:  6 pen    Refill:  3    Disposition:  in 6 month(s)  Chrystie Nose, MD, [facility], FACP    [facility]  Medical Director of the [facility]  [facility]ovascular Risk Reduction [facility] Diplomate of the American Board of Clinical Lipidology Attending Cardiologist  Direct Dial: [phone]  Fax: [phone]  Website:  [email or web address]  Chrystie Nose, MD  09/26/2018 9:37 AM

## 2018-09-26 NOTE — Telephone Encounter (Signed)
New Message:   Patient returning a call back concering her appt today. I didn't see a note.

## 2018-09-26 NOTE — Patient Instructions (Signed)
Medication Instructions:  Your physician recommends that you continue on your current medications as directed. Please refer to the Current Medication list given to you today.  If you need a refill on your cardiac medications before your next appointment, please call your pharmacy.   Lab work: FASTING lab work in 6 months to check cholesterol.   If you have labs (blood work) drawn today and your tests are completely normal, you will receive your results only by: Marland Kitchen MyChart Message (if you have MyChart) OR . A paper copy in the mail If you have any lab test that is abnormal or we need to change your treatment, we will call you to review the results.  Testing/Procedures: NONE  Follow-Up: At [facility], you and your health needs are our priority.  As part of our continuing mission to provide you with exceptional heart care, we have created designated Provider Care Teams.  These Care Teams include your primary Cardiologist (physician) and Advanced Practice Providers (APPs -  Physician Assistants and Nurse Practitioners) who all work together to provide you with the care you need, when you need it. Azalee Course, [place] and [place], [place] are on [facility]. [facility]. Bonita Quin will need a follow up appointment in 6 months with Dr. Rennis Golden - [facility]

## 2018-09-26 NOTE — Telephone Encounter (Signed)
Patient just completed e-visit with Dr. Rennis Golden

## 2018-11-22 ENCOUNTER — Telehealth: Payer: Self-pay | Admitting: Internal Medicine

## 2018-11-22 NOTE — Telephone Encounter (Signed)
Pt called and said that she wanted to cancel her lab appt for Friday and that she had already canceled her appt for Monday through [facility], she said she will call back to reschedule. Per Dr Renold Genta she can't get a refill on meds until she has appointment

## 2018-11-23 ENCOUNTER — Other Ambulatory Visit: Payer: BLUE CROSS/BLUE SHIELD | Admitting: Internal Medicine

## 2018-11-27 ENCOUNTER — Ambulatory Visit: Payer: BLUE CROSS/BLUE SHIELD | Admitting: Internal Medicine

## 2018-11-30 ENCOUNTER — Other Ambulatory Visit: Payer: Self-pay

## 2018-11-30 ENCOUNTER — Other Ambulatory Visit: Payer: BC Managed Care – PPO | Admitting: Internal Medicine

## 2018-11-30 DIAGNOSIS — R7302 Impaired glucose tolerance (oral): Secondary | ICD-10-CM | POA: Diagnosis not present

## 2018-12-01 LAB — MICROALBUMIN / CREATININE URINE RATIO
Creatinine, Urine: 145 mg/dL (ref 20–275)
Microalb Creat Ratio: 3 mcg/mg creat (ref <30)
Microalb, Ur: 0.4 mg/dL

## 2018-12-01 LAB — HEMOGLOBIN A1C
Hgb A1c MFr Bld: 5.9 % of total Hgb — ABNORMAL HIGH (ref <5.7)
Mean Plasma Glucose: 123 (calc)
eAG (mmol/L): 6.8 (calc)

## 2018-12-04 ENCOUNTER — Other Ambulatory Visit: Payer: Self-pay

## 2018-12-04 ENCOUNTER — Ambulatory Visit (INDEPENDENT_AMBULATORY_CARE_PROVIDER_SITE_OTHER): Payer: BC Managed Care – PPO | Admitting: Internal Medicine

## 2018-12-04 VITALS — BP 130/80

## 2018-12-04 DIAGNOSIS — Z8659 Personal history of other mental and behavioral disorders: Secondary | ICD-10-CM | POA: Diagnosis not present

## 2018-12-04 DIAGNOSIS — R7302 Impaired glucose tolerance (oral): Secondary | ICD-10-CM

## 2018-12-04 DIAGNOSIS — E039 Hypothyroidism, unspecified: Secondary | ICD-10-CM | POA: Diagnosis not present

## 2018-12-04 DIAGNOSIS — Z8669 Personal history of other diseases of the nervous system and sense organs: Secondary | ICD-10-CM

## 2018-12-04 DIAGNOSIS — Z789 Other specified health status: Secondary | ICD-10-CM

## 2018-12-04 DIAGNOSIS — I10 Essential (primary) hypertension: Secondary | ICD-10-CM

## 2018-12-04 DIAGNOSIS — E782 Mixed hyperlipidemia: Secondary | ICD-10-CM

## 2018-12-04 NOTE — Progress Notes (Signed)
   Subjective:    Patient ID: [name] [name], female    DOB: 05/30/1960, 58 y.o.   MRN: [id number]  HPI  58 year old Female for follow up: Hypertension and depression.  She is seen today via interactive audio and video telecommunications due to the coronavirus pandemic.  [place]  She is agreeable to visit in this format today.  She is identified using 2 identifiers as [name] [name]. Frey a patient in this practice.  Patient reports she is doing well with history of depression and mood disorder treated with Effexor Exar 75 mg daily.  Coping with pandemic quite well.  She has a history of hyperlipidemia and is maintained on Zetia and Repatha per cardiology.  With regard to hypertension she is on HCTZ and amlodipine.  Reports her blood pressures have been quite stable at home.  She has impaired glucose tolerance with hemoglobin A1c 5.9%.  Does not want to be on medication.  Will recheck in 6 months.  In May lipid panel done at cardiology office showed total cholesterol of 160, triglycerides of 239 and previously had been 163 in January and LDL cholesterol normal at 52.  At one point in 2018 her triglycerides were 349.  TSH was checked in January and was normal at 1.73.  She has a history of hypothyroidism and is maintained on levothyroxine 0.05 mg daily.   Review of Systems     Objective:   Physical Exam  Patient reports stable blood pressures at home.  She seen virtually in no acute distress.  No issues with depression.      Assessment & Plan:  Mixed hyperlipidemia treated with Zetia and Repatha  Impaired glucose tolerance treated with diet-recheck in 6 months  History of mood disorder and depression treated effectively with Effexor  Essential hypertension stable with amlodipine and HCTZ  History of migraine headaches treated with Topamax per Dr. Jannifer Franklin  Plan: Would like to see patient in person for health maintenance exam in 6 months.   Recommend annual flu vaccine.

## 2018-12-07 ENCOUNTER — Telehealth: Payer: Self-pay | Admitting: Internal Medicine

## 2018-12-07 NOTE — Telephone Encounter (Signed)
Upon attempting PA for Repatha Sureclick 140mg /mL on [email or web address], received notice that "an active authorization already exists for this patient. Proceed with prescribing the Medication or call Pharmacy Customer Care at [phone]. MPA: [id number] Effective: 2018-05-21 Terminate: 2019-05-22"  An active authorization already exists for this patient. Proceed with prescribing the Medication or call Pharmacy Customer Care at [phone]. MPA: [id number] Effective: 2018-05-21 Terminate: 2019-05-22

## 2018-12-29 ENCOUNTER — Encounter: Payer: Self-pay | Admitting: Internal Medicine

## 2018-12-29 NOTE — Patient Instructions (Addendum)
Continue to monitor blood pressure at home.  Let us plan to do an office physical exam in 6 months.  Recommend annual flu vaccine and continue with current medications.  Watch diet and exercise.  Impaired glucose tolerance to be treated only with diet at the present time.  Continue Zetia and Repatha.  Continue amlodipine and HCTZ as well as Effexor.  Continue thyroid replacement medication.

## 2019-01-08 ENCOUNTER — Ambulatory Visit: Payer: PRIVATE HEALTH INSURANCE | Admitting: Neurology

## 2019-01-14 ENCOUNTER — Ambulatory Visit: Payer: BLUE CROSS/BLUE SHIELD | Admitting: Neurology

## 2019-01-22 ENCOUNTER — Other Ambulatory Visit: Payer: Self-pay | Admitting: Internal Medicine

## 2019-01-22 DIAGNOSIS — Z1231 Encounter for screening mammogram for malignant neoplasm of breast: Secondary | ICD-10-CM

## 2019-01-28 ENCOUNTER — Telehealth: Payer: Self-pay

## 2019-01-28 NOTE — Telephone Encounter (Signed)
I contacted the pt and left a vm asking her to call back to reschedule her 04/08/2019 appt due to Dr. Jannifer Franklin schedule changing. Pt can be rescheduled with NP.

## 2019-03-06 ENCOUNTER — Other Ambulatory Visit: Payer: Self-pay

## 2019-03-06 NOTE — Progress Notes (Signed)
58 y.o. [id number] Married  Caucasian [name] [name] here for annual exam. Post menopausal with symptoms still occurring with hot flushes. Sees Dr. Lelon Mast for cholesterol management, also on hypertension, hypothyroid and cholesterol medication, no change in dosage and stable at present. Labs and aex with PCP. Patient has continued to have vaginal dryness just at entrance of vagina. She has tried OTC with no change. She has a friend who used the vaginal estrogen cream with Vitamin E and felt it helped. Patient interested in using. Denies any aura with migraine ever, no change. No other health issues today.  No LMP recorded. Patient has had a hysterectomy.          Sexually active: Yes.    The current method of family planning is status post hysterectomy.    Exercising: Yes.    walking, golf, hike Smoker:  no  Review of Systems  Constitutional: Negative.   HENT: Negative.   Eyes: Negative.   Respiratory: Negative.   Cardiovascular: Negative.   Gastrointestinal: Negative.   Genitourinary: Negative.   Musculoskeletal: Negative.   Skin: Negative.   Neurological: Negative.   Endo/Heme/Allergies: Negative.   Psychiatric/Behavioral: Negative.     Health Maintenance: Pap:  01-07-16 neg History of Abnormal Pap: yes MMG:  03-07-18 bilateral & left breast u/s birads 2:neg Self Breast exams: no Colonoscopy: 2015 f/u 44yrs, polyp on path. BMD:   none TDaP:  UTD Shingles: no Pneumonia: no Hep C and HIV: hep c neg 2016 Labs: if needed   reports that she has never smoked. She has never used smokeless tobacco. She reports current alcohol use. She reports that she does not use drugs.  Past Medical History:  Diagnosis Date  . Abnormal Pap smear of cervix    44yrs ago  . Classic migraine 09/18/2013  . Diplopia 09/18/2013  . Diverticulitis   . Generalized headaches   . Hyperlipidemia   . Hypertension     Past Surgical History:  Procedure Laterality Date  . ABDOMINAL HYSTERECTOMY    . BREAST SURGERY     reduction  . CERVICAL CONE BIOPSY     15yrs ago  . CESAREAN SECTION     x 2  . LAPAROSCOPIC APPENDECTOMY N/A 07/31/2014   Procedure: APPENDECTOMY LAPAROSCOPIC;  Surgeon: Alphonsa Overall, MD;  Location: [facility];  Service: General;  Laterality: N/A;  . REDUCTION MAMMAPLASTY    . tendon surgery on elbow    . TONSILLECTOMY    . TUBAL LIGATION      Current Outpatient Medications  Medication Sig Dispense Refill  . amLODipine (NORVASC) 5 MG tablet TAKE 1 TABLET BY MOUTH EVERY DAY 90 tablet 3  . Evolocumab (REPATHA SURECLICK) 366 MG/ML SOAJ Inject 1 Dose into the skin every 14 (fourteen) days. 6 pen 3  . ezetimibe (ZETIA) 10 MG tablet Take 10 mg by mouth daily.    . hydrochlorothiazide (HYDRODIURIL) 25 MG tablet TAKE 1 TABLET(25 MG) BY MOUTH DAILY 90 tablet 3  . levothyroxine (SYNTHROID, LEVOTHROID) 50 MCG tablet TAKE 1 TABLET(50 MCG) BY MOUTH DAILY BEFORE BREAKFAST 90 tablet 3  . potassium chloride (K-DUR) 10 MEQ tablet TAKE 1 TABLET(10 MEQ) BY MOUTH DAILY 90 tablet 3  . topiramate (TOPAMAX) 25 MG tablet TAKE 1 TABLET(25 MG) BY MOUTH DAILY 90 tablet 3  . venlafaxine XR (EFFEXOR-XR) 75 MG 24 hr capsule TAKE 1 CAPSULE(75 MG) BY MOUTH DAILY WITH BREAKFAST 90 capsule 3   No current facility-administered medications for this visit.     Family History  Problem  Relation Age of Onset  . Prostate cancer Father   . Heart disease Father 50       CABGs x 3  . Hypertension Father   . Hyperlipidemia Sister   . Hypertension Sister   . Renal cancer Sister   . Other Sister        bile duct cancer  . Alcohol abuse Brother   . Diabetes Brother        PVD  . Hypertension Brother   . Cancer Brother   . Hypertension Brother   . Hypertension Brother   . Ovarian cancer Maternal Grandmother     ROS:  Pertinent items are noted in HPI.  Otherwise, a comprehensive ROS was negative.  Exam:   BP 120/70   Pulse 68   Temp (!) 97.1 F (36.2 C) (Skin)   Resp 16   Ht 5' 4.5" (1.638 m)   BMI 30.93 kg/m   Height: 5' 4.5" (163.8 cm) Ht Readings from Last 3 Encounters:  03/08/19 5' 4.5" (1.638 m)  09/26/18 5\' 5"  (1.651 m)  05/15/18 5\' 5"  (1.651 m)    General appearance: alert, cooperative and appears stated age Head: Normocephalic, without obvious abnormality, atraumatic Neck: no adenopathy, supple, symmetrical, trachea midline and thyroid normal to inspection and palpation Lungs: clear to auscultation bilaterally Breasts: normal appearance, no masses or tenderness, No nipple retraction or dimpling, No nipple discharge or bleeding, No axillary or supraclavicular adenopathy Heart: regular rate and rhythm Abdomen: soft, non-tender; no masses,  no organomegaly Extremities: extremities normal, atraumatic, no cyanosis or edema Skin: Skin color, texture, turgor normal. No rashes or lesions Lymph nodes: Cervical, supraclavicular, and axillary nodes normal. No abnormal inguinal nodes palpated Neurologic: Grossly normal   Pelvic: External genitalia:  no lesions              Urethra:  normal appearing urethra with no masses, tenderness or lesions              Bartholin's and Skene's: normal                 Vagina: normal appearing vagina with normal color and discharge, no lesions              Cervix: absent              Pap taken: No. Bimanual Exam:  Uterus:  uterus absent              Adnexa: normal adnexa and no mass, fullness, tenderness               Rectovaginal: Confirms               Anus:  normal sphincter tone, no lesions  Chaperone present: yes  A:  Well Woman with normal exam  Menopausal no HRT s/p TAH ovaries retained  Vaginal dryness interested vaginal estrogen cream  Hypertension, hypothyroid,cholesterol with MD management.  Mammogram due  P:   Reviewed health and wellness pertinent to exam  Discussed risks/benefits/warning signs and expectations with Estrogen cream use. Patient would like to try. She will have mammogram and once reviewed, will send Rx for trial and have  follow up after use. Patient agreeable. Will send what her friend uses. Estrogen cream with Vitamin E.  Continue follow up with PCP as indicated regarding medications and labs.  Patient to schedule  Pap smear: no   counseled on breast self exam, mammography screening, feminine hygiene, adequate intake of calcium and vitamin D, diet and  exercise, Kegel's exercises  return annually or prn  An After Visit Summary was printed and given to the patient.

## 2019-03-08 ENCOUNTER — Encounter: Payer: Self-pay | Admitting: Certified Nurse Midwife

## 2019-03-08 ENCOUNTER — Telehealth: Payer: Self-pay | Admitting: Certified Nurse Midwife

## 2019-03-08 ENCOUNTER — Other Ambulatory Visit: Payer: Self-pay

## 2019-03-08 ENCOUNTER — Ambulatory Visit (INDEPENDENT_AMBULATORY_CARE_PROVIDER_SITE_OTHER): Payer: BC Managed Care – PPO | Admitting: Certified Nurse Midwife

## 2019-03-08 VITALS — BP 120/70 | HR 68 | Temp 97.1°F | Resp 16 | Ht 64.5 in

## 2019-03-08 DIAGNOSIS — Z01419 Encounter for gynecological examination (general) (routine) without abnormal findings: Secondary | ICD-10-CM

## 2019-03-08 NOTE — Telephone Encounter (Signed)
Last MMG 03/07/18: bilateral & left breast u/s birads 2:neg Next MMG scheduled 03/11/19 Last AEX 03/08/19  Melvia Heaps, CNM  -please review MyChart medication request and advise.

## 2019-03-08 NOTE — Telephone Encounter (Signed)
Patient sent the following message through [facility]. Routing to triage to assist patient with request.  [facility] Meck[facility]  Phone Number: [phone]        drug  Estradiol 0.02% cream with vitamin E  51ml profiled syringes.  one pov twice weekly  [facility]  Dr Megan Salon

## 2019-03-08 NOTE — Telephone Encounter (Signed)
Spoke with patient, advised per Melvia Heaps, CNM. Patient verbalizes understanding and is agreeable.

## 2019-03-08 NOTE — Telephone Encounter (Signed)
Ok for trial after mammogram is completed and reviewed.

## 2019-03-08 NOTE — Patient Instructions (Signed)
EXERCISE AND DIET:  We recommended that you start or continue a regular exercise program for good health. Regular exercise means any activity that makes your heart beat faster and makes you sweat.  We recommend exercising at least 30 minutes per day at least 3 days a week, preferably 4 or 5.  We also recommend a diet low in fat and sugar.  Inactivity, poor dietary choices and obesity can cause diabetes, heart attack, stroke, and kidney damage, among others.    ALCOHOL AND SMOKING:  Women should limit their alcohol intake to no more than 7 drinks/beers/glasses of wine (combined, not each!) per week. Moderation of alcohol intake to this level decreases your risk of breast cancer and liver damage. And of course, no recreational drugs are part of a healthy lifestyle.  And absolutely no smoking or even second hand smoke. Most people know smoking can cause heart and lung diseases, but did you know it also contributes to weakening of your bones? Aging of your skin?  Yellowing of your teeth and nails?  CALCIUM AND VITAMIN D:  Adequate intake of calcium and Vitamin D are recommended.  The recommendations for exact amounts of these supplements seem to change often, but generally speaking 600 mg of calcium (either carbonate or citrate) and 800 units of Vitamin D per day seems prudent. Certain women may benefit from higher intake of Vitamin D.  If you are among these women, your doctor will have told you during your visit.    PAP SMEARS:  Pap smears, to check for cervical cancer or precancers,  have traditionally been done yearly, although recent scientific advances have shown that most women can have pap smears less often.  However, every woman still should have a physical exam from her gynecologist every year. It will include a breast check, inspection of the vulva and vagina to check for abnormal growths or skin changes, a visual exam of the cervix, and then an exam to evaluate the size and shape of the uterus and  ovaries.  And after 58 years of age, a rectal exam is indicated to check for rectal cancers. We will also provide age appropriate advice regarding health maintenance, like when you should have certain vaccines, screening for sexually transmitted diseases, bone density testing, colonoscopy, mammograms, etc.   MAMMOGRAMS:  All women over 40 years old should have a yearly mammogram. Many facilities now offer a "3D" mammogram, which may cost around $50 extra out of pocket. If possible,  we recommend you accept the option to have the 3D mammogram performed.  It both reduces the number of women who will be called back for extra views which then turn out to be normal, and it is better than the routine mammogram at detecting truly abnormal areas.    COLONOSCOPY:  Colonoscopy to screen for colon cancer is recommended for all women at age 50.  We know, you hate the idea of the prep.  We agree, BUT, having colon cancer and not knowing it is worse!!  Colon cancer so often starts as a polyp that can be seen and removed at colonscopy, which can quite literally save your life!  And if your first colonoscopy is normal and you have no family history of colon cancer, most women don't have to have it again for 10 years.  Once every ten years, you can do something that may end up saving your life, right?  We will be happy to help you get it scheduled when you are ready.    Be sure to check your insurance coverage so you understand how much it will cost.  It may be covered as a preventative service at no cost, but you should check your particular policy.     Conjugated Estrogens vaginal cream What is this medicine? CONJUGATED ESTROGENS (CON ju gate ed ESS troe jenz) are a mixture of female hormones. This cream can help relieve symptoms associated with menopause.like vaginal dryness and irritation. This medicine may be used for other purposes; ask your health care provider or pharmacist if you have questions. COMMON BRAND  NAME(S): Premarin What should I tell my health care provider before I take this medicine? They need to know if you have any of these conditions:  abnormal vaginal bleeding  blood vessel disease or blood clots  breast, cervical, endometrial, or uterine cancer  dementia  diabetes  gallbladder disease  heart disease or recent heart attack  high blood pressure  high cholesterol  high level of calcium in the blood  hysterectomy  kidney disease  liver disease  migraine headaches  protein C deficiency  protein S deficiency  stroke  systemic lupus erythematosus (SLE)  tobacco smoker  an unusual or allergic reaction to estrogens other medicines, foods, dyes, or preservatives  pregnant or trying to get pregnant  breast-feeding How should I use this medicine? This medicine is for use in the vagina only. Do not take by mouth. Follow the directions on the prescription label. Use at bedtime unless otherwise directed by your doctor or health care professional. Use the special applicator supplied with the cream. Wash hands before and after use. Fill the applicator with the cream and remove from the tube. Lie on your back, part and bend your knees. Insert the applicator into the vagina and push the plunger to expel the cream into the vagina. Wash the applicator with warm soapy water and rinse well. Use exactly as directed for the complete length of time prescribed. Do not stop using except on the advice of your doctor or health care professional. Talk to your pediatrician regarding the use of this medicine in children. Special care may be needed. A patient package insert for the product will be given with each prescription and refill. Read this sheet carefully each time. The sheet may change frequently. Overdosage: If you think you have taken too much of this medicine contact a poison control center or emergency room at once. NOTE: This medicine is only for you. Do not share this  medicine with others. What if I miss a dose? If you miss a dose, use it as soon as you can. If it is almost time for your next dose, use only that dose. Do not use double or extra doses. What may interact with this medicine? Do not take this medicine with any of the following medications:  aromatase inhibitors like aminoglutethimide, anastrozole, exemestane, letrozole, testolactone This medicine may also interact with the following medications:  barbiturates used for inducing sleep or treating seizures  carbamazepine  grapefruit juice  medicines for fungal infections like itraconazole and ketoconazole  raloxifene or tamoxifen  rifabutin  rifampin  rifapentine  ritonavir  some antibiotics used to treat infections  St. John's Wort  warfarin This list may not describe all possible interactions. Give your health care provider a list of all the medicines, herbs, non-prescription drugs, or dietary supplements you use. Also tell them if you smoke, drink alcohol, or use illegal drugs. Some items may interact with your medicine. What should I watch for   while using this medicine? Visit your health care professional for regular checks on your progress. You will need a regular breast and pelvic exam. You should also discuss the need for regular mammograms with your health care professional, and follow his or her guidelines. This medicine can make your body retain fluid, making your fingers, hands, or ankles swell. Your blood pressure can go up. Contact your doctor or health care professional if you feel you are retaining fluid. If you have any reason to think you are pregnant; stop taking this medicine at once and contact your doctor or health care professional. Tobacco smoking increases the risk of getting a blood clot or having a stroke, especially if you are more than 58 years old. You are strongly advised not to smoke. If you wear contact lenses and notice visual changes, or if the  lenses begin to feel uncomfortable, consult your eye care specialist. If you are going to have elective surgery, you may need to stop taking this medicine beforehand. Consult your health care professional for advice prior to scheduling the surgery. What side effects may I notice from receiving this medicine? Side effects that you should report to your doctor or health care professional as soon as possible:  allergic reactions like skin rash, itching or hives, swelling of the face, lips, or tongue  breast tissue changes or discharge  changes in vision  chest pain  confusion, trouble speaking or understanding  dark urine  general ill feeling or flu-like symptoms  light-colored stools  nausea, vomiting  pain, swelling, warmth in the leg  right upper belly pain  severe headaches  shortness of breath  sudden numbness or weakness of the face, arm or leg  trouble walking, dizziness, loss of balance or coordination  unusual vaginal bleeding  yellowing of the eyes or skin Side effects that usually do not require medical attention (report to your doctor or health care professional if they continue or are bothersome):  hair loss  increased hunger or thirst  increased urination  symptoms of vaginal infection like itching, irritation or unusual discharge  unusually weak or tired This list may not describe all possible side effects. Call your doctor for medical advice about side effects. You may report side effects to FDA at 1-800-FDA-1088. Where should I keep my medicine? Keep out of the reach of children. Store at room temperature between 15 and 30 degrees C (59 and 86 degrees F). Throw away any unused medicine after the expiration date. NOTE: This sheet is a summary. It may not cover all possible information. If you have questions about this medicine, talk to your doctor, pharmacist, or health care provider.  2020 Elsevier/Gold Standard (2010-07-21 09:20:36)  

## 2019-03-11 ENCOUNTER — Ambulatory Visit
Admission: RE | Admit: 2019-03-11 | Discharge: 2019-03-11 | Disposition: A | Discharge status: Home / Self Care | Payer: Self-pay | Source: Ambulatory Visit | Attending: Internal Medicine | Admitting: Internal Medicine

## 2019-03-11 ENCOUNTER — Other Ambulatory Visit: Payer: Self-pay

## 2019-03-11 DIAGNOSIS — Z1231 Encounter for screening mammogram for malignant neoplasm of breast: Secondary | ICD-10-CM | POA: Diagnosis not present

## 2019-03-13 ENCOUNTER — Other Ambulatory Visit: Payer: Self-pay | Admitting: Internal Medicine

## 2019-03-13 ENCOUNTER — Ambulatory Visit: Payer: PRIVATE HEALTH INSURANCE | Admitting: Sports Medicine

## 2019-03-13 DIAGNOSIS — R928 Other abnormal and inconclusive findings on diagnostic imaging of breast: Secondary | ICD-10-CM

## 2019-03-15 ENCOUNTER — Ambulatory Visit (INDEPENDENT_AMBULATORY_CARE_PROVIDER_SITE_OTHER): Payer: Self-pay | Admitting: Sports Medicine

## 2019-03-15 ENCOUNTER — Other Ambulatory Visit: Payer: Self-pay

## 2019-03-15 ENCOUNTER — Ambulatory Visit (INDEPENDENT_AMBULATORY_CARE_PROVIDER_SITE_OTHER): Payer: BC Managed Care – PPO

## 2019-03-15 ENCOUNTER — Encounter: Payer: Self-pay | Admitting: Sports Medicine

## 2019-03-15 DIAGNOSIS — M25522 Pain in left elbow: Secondary | ICD-10-CM

## 2019-03-15 MED ORDER — ACETAMINOPHEN ER 650 MG PO TBCR: 650.0000 mg | EXTENDED_RELEASE_TABLET | Freq: Three times a day (TID) | ORAL | 3 refills | Status: AC | PRN

## 2019-03-15 NOTE — Progress Notes (Signed)
Subjective:    CC: Left elbow pain  HPI:  For several weeks now this pleasant 58 year old female has had pain that she localizes in the posterior lateral elbow, worse in the mornings and at night, moderate gelling, better with NSAIDs but she is getting some GI discomfort.  She has not had imaging, has not done any therapy, worse when golfing.  Moderate, persistent.  I reviewed the past medical history, family history, social history, surgical history, and allergies today and no changes were needed.  Please see the problem list section below in epic for further details.  Past Medical History: Past Medical History:  Diagnosis Date  . Abnormal Pap smear of cervix    47yrs ago  . Classic migraine 09/18/2013  . Diplopia 09/18/2013  . Diverticulitis   . Generalized headaches   . Hyperlipidemia   . Hypertension    Past Surgical History: Past Surgical History:  Procedure Laterality Date  . ABDOMINAL HYSTERECTOMY    . BREAST SURGERY     reduction  . CERVICAL CONE BIOPSY     54yrs ago  . CESAREAN SECTION     x 2  . LAPAROSCOPIC APPENDECTOMY N/A 07/31/2014   Procedure: APPENDECTOMY LAPAROSCOPIC;  Surgeon: Ovidio Kin, MD;  Location: [facility];  Service: General;  Laterality: N/A;  . REDUCTION MAMMAPLASTY    . tendon surgery on elbow    . TONSILLECTOMY    . TUBAL LIGATION     Social History: Social History   Socioeconomic History  . Marital status: Married    Spouse name: Not on file  . Number of children: 2  . Years of education: college  . Highest education level: Not on file  Occupational History  . Occupation: Tree surgeon  Social Needs  . Financial resource strain: Not on file  . Food insecurity    Worry: Not on file    Inability: Not on file  . Transportation needs    Medical: Not on file    Non-medical: Not on file  Tobacco Use  . Smoking status: Never Smoker  . Smokeless tobacco: Never Used  Substance and Sexual Activity  . Alcohol use: Yes    Comment: 0-3 a day  .  Drug use: No  . Sexual activity: Yes    Birth control/protection: Surgical    Comment: hysterectomy  Lifestyle  . Physical activity    Days per week: Not on file    Minutes per session: Not on file  . Stress: Not on file  Relationships  . Social Musician on phone: Not on file    Gets together: Not on file    Attends religious service: Not on file    Active member of club or organization: Not on file    Attends meetings of clubs or organizations: Not on file    Relationship status: Not on file  Other Topics Concern  . Not on file  Social History Narrative   Lives at home w/ her husband   Right-handed   Caffeine: weekly coffee and daily hot tea   Family History: Family History  Problem Relation Age of Onset  . Prostate cancer Father   . Heart disease Father 50       CABGs x 3  . Hypertension Father   . Hyperlipidemia Sister   . Hypertension Sister   . Renal cancer Sister   . Other Sister        bile duct cancer  . Alcohol abuse Brother   .  Diabetes Brother        PVD  . Hypertension Brother   . Cancer Brother   . Hypertension Brother   . Hypertension Brother   . Ovarian cancer Maternal Grandmother    Allergies: Allergies  Allergen Reactions  . Statins Other (See Comments)    Cognitive impairment - Atorvastatin, Rosuvastatin, Pravastatin, Simvastatin (high and low doses)  . Other     Anything with venom  . Ramipril Cough  . Sulfa Antibiotics Other (See Comments)    Itching.  . Yellow Jacket Venom [Bee Venom] Hives, Swelling and Rash   Medications: See med rec.  Review of Systems: No headache, visual changes, nausea, vomiting, diarrhea, constipation, dizziness, abdominal pain, skin rash, fevers, chills, night sweats, swollen lymph nodes, weight loss, chest pain, body aches, joint swelling, muscle aches, shortness of breath, mood changes, visual or auditory hallucinations.  Objective:    General: Well Developed, well nourished, and in no acute  distress.  Neuro: Alert and oriented x3, extra-ocular muscles intact, sensation grossly intact.  HEENT: Normocephalic, atraumatic, pupils equal round reactive to light, neck supple, no masses, no lymphadenopathy, thyroid nonpalpable.  Skin: Warm and dry, no rashes noted.  Cardiac: Regular rate and rhythm, no murmurs rubs or gallops.  Respiratory: Clear to auscultation bilaterally. Not using accessory muscles, speaking in full sentences.  Abdominal: Soft, nontender, nondistended, positive bowel sounds, no masses, no organomegaly.  Left elbow: Unremarkable to inspection. Range of motion full pronation, supination, flexion, extension. Strength is full to all of the above directions Stable to varus, valgus stress. Negative moving valgus stress test. Minimal discomfort on the posterior lateral elbow over the anconeus muscle and the joint line, most part no tenderness of the common extensor tendon origin. Ulnar nerve does not sublux. Negative cubital tunnel Tinel's.  Impression and Recommendations:    The patient was counselled, risk factors were discussed, anticipatory guidance given.  Left elbow pain Several weeks of pain in the left elbow, localized over the lateral epicondyle but also referrable to the joint. X-rays, arthritis strength Tylenol, counterforce brace, formal PT. Return to see me in 4 weeks, MR for interventional planning if no better.   ___________________________________________ Gwen Her. Dianah Field, M.D., ABFM., CAQSM. Primary Care and Sports Medicine [facility]  Adjunct Professor of [employer] [facility] [employer]

## 2019-03-15 NOTE — Assessment & Plan Note (Signed)
Several weeks of pain in the left elbow, localized over the lateral epicondyle but also referrable to the joint. X-rays, arthritis strength Tylenol, counterforce brace, formal PT. Return to see me in 4 weeks, MR for interventional planning if no better.

## 2019-03-18 ENCOUNTER — Other Ambulatory Visit: Payer: PRIVATE HEALTH INSURANCE

## 2019-03-18 ENCOUNTER — Other Ambulatory Visit: Payer: Self-pay | Admitting: Internal Medicine

## 2019-03-18 ENCOUNTER — Ambulatory Visit
Admission: RE | Admit: 2019-03-18 | Discharge: 2019-03-18 | Disposition: A | Discharge status: Home / Self Care | Payer: PRIVATE HEALTH INSURANCE | Source: Ambulatory Visit | Attending: Internal Medicine | Admitting: Internal Medicine

## 2019-03-18 ENCOUNTER — Other Ambulatory Visit: Payer: Self-pay

## 2019-03-18 DIAGNOSIS — R928 Other abnormal and inconclusive findings on diagnostic imaging of breast: Secondary | ICD-10-CM

## 2019-03-18 DIAGNOSIS — R590 Localized enlarged lymph nodes: Secondary | ICD-10-CM | POA: Diagnosis not present

## 2019-03-20 ENCOUNTER — Ambulatory Visit: Payer: BC Managed Care – PPO | Attending: Sports Medicine | Admitting: Physical Therapy

## 2019-03-25 NOTE — Telephone Encounter (Signed)
LM for patient to call back to discuss virtual clinic on 04/04/2019 Appointment type changed Need to confirm with patient or r/s

## 2019-04-02 DIAGNOSIS — E782 Mixed hyperlipidemia: Secondary | ICD-10-CM | POA: Diagnosis not present

## 2019-04-02 LAB — LIPID PANEL
Chol/HDL Ratio: 3.1 ratio (ref 0.0–4.4)
Cholesterol, Total: 178 mg/dL (ref 100–199)
HDL: 58 mg/dL (ref >39)
LDL Chol Calc (NIH): 74 mg/dL (ref 0–99)
Triglycerides: 285 mg/dL — ABNORMAL HIGH (ref 0–149)
VLDL Cholesterol Cal: 46 mg/dL — ABNORMAL HIGH (ref 5–40)

## 2019-04-03 ENCOUNTER — Other Ambulatory Visit: Payer: Self-pay | Admitting: Internal Medicine

## 2019-04-03 DIAGNOSIS — E782 Mixed hyperlipidemia: Secondary | ICD-10-CM

## 2019-04-04 ENCOUNTER — Encounter: Payer: Self-pay | Admitting: Internal Medicine

## 2019-04-04 ENCOUNTER — Telehealth (INDEPENDENT_AMBULATORY_CARE_PROVIDER_SITE_OTHER): Payer: BC Managed Care – PPO | Admitting: Internal Medicine

## 2019-04-04 VITALS — BP 122/82 | HR 77 | Ht 64.5 in | Wt 167.0 lb

## 2019-04-04 DIAGNOSIS — E782 Mixed hyperlipidemia: Secondary | ICD-10-CM

## 2019-04-04 DIAGNOSIS — Z8342 Family history of familial hypercholesterolemia: Secondary | ICD-10-CM

## 2019-04-04 DIAGNOSIS — Z789 Other specified health status: Secondary | ICD-10-CM

## 2019-04-04 NOTE — Progress Notes (Signed)
Virtual Visit via Video Note   This visit type was conducted due to national recommendations for restrictions regarding the COVID-19 Pandemic (e.g. social distancing) in an effort to limit this patient's exposure and mitigate transmission in our community.  Due to her co-morbid illnesses, this patient is at least at moderate risk for complications without adequate follow up.  This format is felt to be most appropriate for this patient at this time.  All issues noted in this document were discussed and addressed.  A limited physical exam was performed with this format.  Please refer to the patient's chart for her consent to telehealth for [facility].   Evaluation Performed: Telephone visit  Date:  04/04/2019   ID:  [name] [name], DOB Sep 03, 1960, MRN [id number]  Patient Location:  [address] [place] [address]  Provider location:   [address] [place], [place] [address]  PCP:  Margaree Mackintosh, MD  Cardiologist:  No primary care provider on file. Electrophysiologist:  None   Chief Complaint:  No complaints  History of Present Illness:    [name] [name] is a 58 y.o. female who presents via audio/video conferencing for a telehealth visit today.  [name] [name] was seen today for video follow-up.  Overall she is doing well on Repatha and has no significant side effects, compared to the side effect she had on statin therapy.  Please report she can has continued good control of her cholesterol.  Recent labs showed total cholesterol 160, triglycerides 239, HDL 60 and LDL of 52.  Her LDL 10 months ago was 119 with total cholesterol 220.  Her triglycerides have gone up somewhat accordingly, however given the fact she has no demonstrated coronary calcification, I think there is unfortunately little evidence supporting the addition of medications to lower her triglycerides.  While there is strong evidence for the use of Vascepa, and reducing cardiovascular events,  this is in a patient population with known coronary artery disease on top of statin or PCSK9 inhibitor therapy.  There is little evidence of cardiovascular event reduction or mortality benefit in patients with fibrate's.  Based on that, I am recommending dietary interventions including reduction in alcohol intake and weight loss with more exercise which has been an issue in the past several months per her report due to isolating from the COVID-19 pandemic.  04/04/2019  [name] [name] returns today for follow-up.  Overall she is doing well.  She says she has lost some weight and continues to eat healthy and exercise regularly.  She has been compliant with her Repatha and ezetimibe.  Spite this, there has been small increase in her cholesterol.  Her total cholesterol is at now 178, triglycerides 285, HDL 58 and LDL of 74.  The patient does not have symptoms concerning for COVID-19 infection (fever, chills, cough, or new SHORTNESS OF BREATH).    Prior CV studies:   The following studies were reviewed today:  Chart reviewed  PMHx:  Past Medical History:  Diagnosis Date  . Abnormal Pap smear of cervix    86yrs ago  . Classic migraine 09/18/2013  . Diplopia 09/18/2013  . Diverticulitis   . Generalized headaches   . Hyperlipidemia   . Hypertension     Past Surgical History:  Procedure Laterality Date  . ABDOMINAL HYSTERECTOMY    . BREAST SURGERY     reduction  . CERVICAL CONE BIOPSY     38yrs ago  . CESAREAN SECTION     x  2  . LAPAROSCOPIC APPENDECTOMY N/A 07/31/2014   Procedure: APPENDECTOMY LAPAROSCOPIC;  Surgeon: Alphonsa Overall, MD;  Location: [facility];  Service: General;  Laterality: N/A;  . REDUCTION MAMMAPLASTY    . tendon surgery on elbow    . TONSILLECTOMY    . TUBAL LIGATION      FAMHx:  Family History  Problem Relation Age of Onset  . Prostate cancer Father   . Heart disease Father 89       CABGs x 3  . Hypertension Father   . Hyperlipidemia Sister   . Hypertension Sister    . Renal cancer Sister   . Other Sister        bile duct cancer  . Alcohol abuse Brother   . Diabetes Brother        PVD  . Hypertension Brother   . Cancer Brother   . Hypertension Brother   . Hypertension Brother   . Ovarian cancer Maternal Grandmother     SOCHx:   reports that she has never smoked. She has never used smokeless tobacco. She reports current alcohol use. She reports that she does not use drugs.  ALLERGIES:  Allergies  Allergen Reactions  . Statins Other (See Comments)    Cognitive impairment - Atorvastatin, Rosuvastatin, Pravastatin, Simvastatin (high and low doses)  . Other     Anything with venom  . Ramipril Cough  . Sulfa Antibiotics Other (See Comments)    Itching.  . Yellow Jacket Venom [Bee Venom] Hives, Swelling and Rash    MEDS:  Current Meds  Medication Sig  . acetaminophen (TYLENOL) 650 MG CR tablet Take 1 tablet (650 mg total) by mouth every 8 (eight) hours as needed for pain.  Marland Kitchen amLODipine (NORVASC) 5 MG tablet TAKE 1 TABLET BY MOUTH EVERY DAY  . ezetimibe (ZETIA) 10 MG tablet Take 10 mg by mouth daily.  . hydrochlorothiazide (HYDRODIURIL) 25 MG tablet TAKE 1 TABLET BY MOUTH DAILY  . levothyroxine (SYNTHROID, LEVOTHROID) 50 MCG tablet TAKE 1 TABLET(50 MCG) BY MOUTH DAILY BEFORE BREAKFAST  . potassium chloride (K-DUR) 10 MEQ tablet TAKE 1 TABLET(10 MEQ) BY MOUTH DAILY  . REPATHA SURECLICK 825 MG/ML SOAJ INJECT ONE ML INTO THE SKIN EVERY FOURTEEN DAYS  . topiramate (TOPAMAX) 25 MG tablet TAKE 1 TABLET(25 MG) BY MOUTH DAILY  . venlafaxine XR (EFFEXOR-XR) 75 MG 24 hr capsule TAKE 1 CAPSULE(75 MG) BY MOUTH DAILY WITH BREAKFAST     ROS: Pertinent items noted in HPI and remainder of comprehensive ROS otherwise negative.  Labs/Other Tests and Data Reviewed:    Recent Labs: 05/11/2018: ALT 25; BUN 24; Creat 0.81; Hemoglobin 14.3; Platelets 189; Potassium 3.9; Sodium 143; TSH 1.73   Recent Lipid Panel Lab Results  Component Value Date/Time    CHOL 178 04/02/2019 09:06 AM   TRIG 285 (H) 04/02/2019 09:06 AM   HDL 58 04/02/2019 09:06 AM   CHOLHDL 3.1 04/02/2019 09:06 AM   CHOLHDL 2.5 05/11/2018 09:52 AM   LDLCALC 74 04/02/2019 09:06 AM   LDLCALC 77 05/11/2018 09:52 AM   LDLDIRECT 130.7 11/27/2008 11:41 AM    Wt Readings from Last 3 Encounters:  04/04/19 167 lb (75.8 kg)  09/26/18 183 lb (83 kg)  05/15/18 174 lb (78.9 kg)     Exam:    Vital Signs:  BP 122/82   Pulse 77   Ht 5' 4.5" (1.638 m)   Wt 167 lb (75.8 kg)   BMI 28.22 kg/m    Deferred  ASSESSMENT &  PLAN:    1. 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 No CAC (03/2016) 3. Strong family history of premature CAD in mother, brothers and sister 74. Statin intolerance  [name] [name] has had a slight increase in her LDL cholesterol and triglycerides although says she has had no significant dietary changes and in fact thinks she has lost some weight.  She felt like her last office weight was unusually high it may have been erroneous.  Nonetheless she seemed frustrated that her cholesterol was somewhat worse.  I do not think this represents a failure of the medication rather may be related to dietary factors or less activity. She does not think these as possibilities however I encouraged her to continue to work on that and we will plan a repeat lipid in 6 months.  COVID-19 Education: The signs and symptoms of COVID-19 were discussed with the patient and how to seek care for testing (follow up with PCP or arrange E-visit).  The importance of social distancing was discussed today.  Patient Risk:   After full review of this patients clinical status, I feel that they are at least moderate risk at this time.  Time:   Today, I have spent 15 minutes with the patient with telehealth technology discussing lipid management, dietary education, risk factor modification.     Medication Adjustments/Labs and Tests Ordered: Current medicines are  reviewed at length with the patient today.  Concerns regarding medicines are outlined above.   Tests Ordered: Orders Placed This Encounter  Procedures  . Lipid panel    Medication Changes: No orders of the defined types were placed in this encounter.   Disposition:  in 6 month(s)  Chrystie NoseKenneth C. Salsabeel Gorelick, MD, [facility] LLCFACC, FACP  [place]  [facility]  Medical Director of the Advanced Lipid Disorders &  Cardiovascular Risk Reduction Clinic Diplomate of the American Board of Clinical Lipidology Attending Cardiologist  Direct Dial: [phone]  Fax: [phone]  Website:  [email or web address]  Chrystie NoseKenneth C Trevan Messman, MD  04/04/2019 10:30 AM

## 2019-04-04 NOTE — Patient Instructions (Signed)
Medication Instructions:  No change *If you need a refill on your cardiac medications before your next appointment, please call your pharmacy*  Lab Work: Fasting lipid panel in 6 months  If you have labs (blood work) drawn today and your tests are completely normal, you will receive your results only by: Marland Kitchen MyChart Message (if you have MyChart) OR . A paper copy in the mail If you have any lab test that is abnormal or we need to change your treatment, we will call you to review the results.  Follow-Up: At [facility], you and your health needs are our priority.  As part of our continuing mission to provide you with exceptional heart care, we have created designated Provider Care Teams.  These Care Teams include your primary Cardiologist (physician) and Advanced Practice Providers (APPs -  Physician Assistants and Nurse Practitioners) who all work together to provide you with the care you need, when you need it.  Your next appointment:   6 month(s)  The format for your next appointment:   Either In Person or Virtual  Provider:   Raliegh Ip Mali Hilty, MD  Other Instructions

## 2019-04-08 ENCOUNTER — Ambulatory Visit: Payer: Self-pay | Admitting: Neurology

## 2019-04-15 ENCOUNTER — Ambulatory Visit: Payer: Self-pay | Admitting: Neurology

## 2019-04-15 ENCOUNTER — Telehealth: Payer: Self-pay

## 2019-04-15 ENCOUNTER — Encounter: Payer: Self-pay | Admitting: Neurology

## 2019-04-15 NOTE — Progress Notes (Deleted)
PATIENT: [name] DOB: 1961-03-10  REASON FOR VISIT: follow up HISTORY FROM: patient  HISTORY OF PRESENT ILLNESS: Today 04/15/19  Ms. Ewton is a 58 year old female with history of migraine headache.  She has done well on low-dose Topamax.  If she tries to come off medication she will have headache recurrence.  She takes Maxalt if needed.  HISTORY 01/05/2018 Dr. Anne Hahn: Ms. Tennison is a 58 year old right-handed white female with a history of migraine headache.  The patient has done very well even on a low-dose of the Topamax taking 25 mg daily.  If she tries to come off the medication, she will begin having migraine within 2 days.  She occasionally may have a dull pain behind the right eye when she goes to sleep at night, but she never develops a migraine.  She has Maxalt to take at home but she does not take the medication as she does not need to.  The patient remains on Effexor as well.  She came off of Effexor for 8 months but felt poorly, she went back on the medication.  The patient does report some mild problems with remembering names for people, this has been life long problem, but she is becoming a bit more concerned about that as she gets older.  The patient returns for an evaluation.  REVIEW OF SYSTEMS: Out of a complete 14 system review of symptoms, the patient complains only of the following symptoms, and all other reviewed systems are negative.  ALLERGIES: Allergies  Allergen Reactions  . Statins Other (See Comments)    Cognitive impairment - Atorvastatin, Rosuvastatin, Pravastatin, Simvastatin (high and low doses)  . Other     Anything with venom  . Ramipril Cough  . Sulfa Antibiotics Other (See Comments)    Itching.  . Yellow Jacket Venom [Bee Venom] Hives, Swelling and Rash    HOME MEDICATIONS: Outpatient Medications Prior to Visit  Medication Sig Dispense Refill  . acetaminophen (TYLENOL) 650 MG CR tablet Take 1 tablet (650 mg total) by mouth every 8  (eight) hours as needed for pain. 90 tablet 3  . amLODipine (NORVASC) 5 MG tablet TAKE 1 TABLET BY MOUTH EVERY DAY 90 tablet 3  . ezetimibe (ZETIA) 10 MG tablet Take 10 mg by mouth daily.    . hydrochlorothiazide (HYDRODIURIL) 25 MG tablet TAKE 1 TABLET BY MOUTH DAILY 90 tablet 2  . levothyroxine (SYNTHROID, LEVOTHROID) 50 MCG tablet TAKE 1 TABLET(50 MCG) BY MOUTH DAILY BEFORE BREAKFAST 90 tablet 3  . potassium chloride (K-DUR) 10 MEQ tablet TAKE 1 TABLET(10 MEQ) BY MOUTH DAILY 90 tablet 3  . REPATHA SURECLICK 140 MG/ML SOAJ INJECT ONE ML INTO THE SKIN EVERY FOURTEEN DAYS 2 mL 0  . topiramate (TOPAMAX) 25 MG tablet TAKE 1 TABLET(25 MG) BY MOUTH DAILY 90 tablet 3  . venlafaxine XR (EFFEXOR-XR) 75 MG 24 hr capsule TAKE 1 CAPSULE(75 MG) BY MOUTH DAILY WITH BREAKFAST 90 capsule 3   No facility-administered medications prior to visit.    PAST MEDICAL HISTORY: Past Medical History:  Diagnosis Date  . Abnormal Pap smear of cervix    10yrs ago  . Classic migraine 09/18/2013  . Diplopia 09/18/2013  . Diverticulitis   . Generalized headaches   . Hyperlipidemia   . Hypertension     PAST SURGICAL HISTORY: Past Surgical History:  Procedure Laterality Date  . ABDOMINAL HYSTERECTOMY    . BREAST SURGERY     reduction  . CERVICAL CONE BIOPSY  43yrs ago  . CESAREAN SECTION     x 2  . LAPAROSCOPIC APPENDECTOMY N/A 07/31/2014   Procedure: APPENDECTOMY LAPAROSCOPIC;  Surgeon: Alphonsa Overall, MD;  Location: [facility];  Service: General;  Laterality: N/A;  . REDUCTION MAMMAPLASTY    . tendon surgery on elbow    . TONSILLECTOMY    . TUBAL LIGATION      FAMILY HISTORY: Family History  Problem Relation Age of Onset  . Prostate cancer Father   . Heart disease Father 60       CABGs x 3  . Hypertension Father   . Hyperlipidemia Sister   . Hypertension Sister   . Renal cancer Sister   . Other Sister        bile duct cancer  . Alcohol abuse Brother   . Diabetes Brother        PVD  .  Hypertension Brother   . Cancer Brother   . Hypertension Brother   . Hypertension Brother   . Ovarian cancer Maternal Grandmother     SOCIAL HISTORY: Social History   Socioeconomic History  . Marital status: Married    Spouse name: Not on file  . Number of children: 2  . Years of education: college  . Highest education level: Not on file  Occupational History  . Occupation: Training and development officer  Tobacco Use  . Smoking status: Never Smoker  . Smokeless tobacco: Never Used  Substance and Sexual Activity  . Alcohol use: Yes    Comment: 0-3 a day  . Drug use: No  . Sexual activity: Yes    Birth control/protection: Surgical    Comment: hysterectomy  Other Topics Concern  . Not on file  Social History Narrative   Lives at home w/ her husband   Right-handed   Caffeine: weekly coffee and daily hot tea   Social Determinants of Health   Financial Resource Strain:   . Difficulty of Paying Living Expenses: Not on file  Food Insecurity:   . Worried About Charity fundraiser in the Last Year: Not on file  . Ran Out of Food in the Last Year: Not on file  Transportation Needs:   . Lack of Transportation (Medical): Not on file  . Lack of Transportation (Non-Medical): Not on file  Physical Activity:   . Days of Exercise per Week: Not on file  . Minutes of Exercise per Session: Not on file  Stress:   . Feeling of Stress : Not on file  Social Connections:   . Frequency of Communication with Friends and Family: Not on file  . Frequency of Social Gatherings with Friends and Family: Not on file  . Attends Religious Services: Not on file  . Active Member of Clubs or Organizations: Not on file  . Attends Archivist Meetings: Not on file  . Marital Status: Not on file  Intimate Partner Violence:   . Fear of Current or Ex-Partner: Not on file  . Emotionally Abused: Not on file  . Physically Abused: Not on file  . Sexually Abused: Not on file      PHYSICAL EXAM  There were no  vitals filed for this visit. There is no height or weight on file to calculate BMI.  Generalized: Well developed, in no acute distress   Neurological examination  Mentation: Alert oriented to time, place, history taking. Follows all commands speech and language fluent Cranial nerve II-XII: Pupils were equal round reactive to light. Extraocular movements were full, visual field  were full on confrontational test. Facial sensation and strength were normal. Uvula tongue midline. Head turning and shoulder shrug  were normal and symmetric. Motor: The motor testing reveals 5 over 5 strength of all 4 extremities. Good symmetric motor tone is noted throughout.  Sensory: Sensory testing is intact to soft touch on all 4 extremities. No evidence of extinction is noted.  Coordination: Cerebellar testing reveals good finger-nose-finger and heel-to-shin bilaterally.  Gait and station: Gait is normal. Tandem gait is normal. Romberg is negative. No drift is seen.  Reflexes: Deep tendon reflexes are symmetric and normal bilaterally.   DIAGNOSTIC DATA (LABS, IMAGING, TESTING) - I reviewed patient records, labs, notes, testing and imaging myself where available.  Lab Results  Component Value Date   WBC 4.0 05/11/2018   HGB 14.3 05/11/2018   HCT 41.4 05/11/2018   MCV 90.2 05/11/2018   PLT 189 05/11/2018      Component Value Date/Time   NA 143 05/11/2018 0952   K 3.9 05/11/2018 0952   CL 106 05/11/2018 0952   CO2 29 05/11/2018 0952   GLUCOSE 103 (H) 05/11/2018 0952   BUN 24 05/11/2018 0952   CREATININE 0.81 05/11/2018 0952   CALCIUM 9.7 05/11/2018 0952   PROT 6.8 05/11/2018 0952   ALBUMIN 4.1 12/12/2016 1006   AST 21 05/11/2018 0952   ALT 25 05/11/2018 0952   ALKPHOS 104 12/12/2016 1006   BILITOT 0.5 05/11/2018 0952   GFRNONAA 81 05/11/2018 0952   GFRAA 93 05/11/2018 0952   Lab Results  Component Value Date   CHOL 178 04/02/2019   HDL 58 04/02/2019   LDLCALC 74 04/02/2019   LDLDIRECT 130.7  11/27/2008   TRIG 285 (H) 04/02/2019   CHOLHDL 3.1 04/02/2019   Lab Results  Component Value Date   HGBA1C 5.9 (H) 11/30/2018   Lab Results  Component Value Date   VITAMINB12 386 09/18/2013   Lab Results  Component Value Date   TSH 1.73 05/11/2018      ASSESSMENT AND PLAN 58 y.o. year old female  has a past medical history of Abnormal Pap smear of cervix, Classic migraine (09/18/2013), Diplopia (09/18/2013), Diverticulitis, Generalized headaches, Hyperlipidemia, and Hypertension. here with ***   I spent 15 minutes with the patient. 50% of this time was spent   Margie EgeSarah Shaquaya Wuellner, PelkieAGNP-C, DNP 04/15/2019, 5:30 AM [facility] [address], [address] [place], [place] [id number] [phone]

## 2019-04-15 NOTE — Telephone Encounter (Signed)
Patient was a no call/no show for their appointment today.   

## 2019-05-02 ENCOUNTER — Other Ambulatory Visit: Payer: Self-pay | Admitting: Internal Medicine

## 2019-05-02 DIAGNOSIS — E782 Mixed hyperlipidemia: Secondary | ICD-10-CM

## 2019-05-10 ENCOUNTER — Telehealth: Payer: Self-pay | Admitting: Internal Medicine

## 2019-05-10 NOTE — Telephone Encounter (Signed)
PA for repatha submitted via CMM [employer] (Key: [id number])

## 2019-05-15 NOTE — Telephone Encounter (Addendum)
Patient has been approved per [email or web address] 1 year (start date 05/14/2019 - end date 05/13/2020)

## 2019-05-19 ENCOUNTER — Ambulatory Visit: Payer: BC Managed Care – PPO | Attending: Internal Medicine

## 2019-05-19 DIAGNOSIS — Z23 Encounter for immunization: Secondary | ICD-10-CM | POA: Diagnosis not present

## 2019-05-19 NOTE — Progress Notes (Signed)
   Covid-19 Vaccination Clinic  Name:  [name] [name]    MRN: [id number] DOB: 11-28-1960  05/19/2019  [name] was observed post Covid-19 immunization for 15 MINS without incidence. She was provided with Vaccine Information Sheet and instruction to access the V-Safe system.   [name] was instructed to call 911 with any severe reactions post vaccine: Marland Kitchen Difficulty breathing  . Swelling of your face and throat  . A fast heartbeat  . A bad rash all over your body  . Dizziness and weakness

## 2019-05-21 ENCOUNTER — Other Ambulatory Visit: Payer: Self-pay

## 2019-05-21 ENCOUNTER — Other Ambulatory Visit: Payer: BC Managed Care – PPO | Admitting: Internal Medicine

## 2019-05-21 DIAGNOSIS — Z789 Other specified health status: Secondary | ICD-10-CM

## 2019-05-21 DIAGNOSIS — E039 Hypothyroidism, unspecified: Secondary | ICD-10-CM

## 2019-05-21 DIAGNOSIS — R7302 Impaired glucose tolerance (oral): Secondary | ICD-10-CM

## 2019-05-21 DIAGNOSIS — Z0000 Encounter for general adult medical examination without abnormal findings: Secondary | ICD-10-CM | POA: Diagnosis not present

## 2019-05-21 DIAGNOSIS — E782 Mixed hyperlipidemia: Secondary | ICD-10-CM | POA: Diagnosis not present

## 2019-05-21 DIAGNOSIS — I10 Essential (primary) hypertension: Secondary | ICD-10-CM | POA: Diagnosis not present

## 2019-05-22 LAB — COMPLETE METABOLIC PANEL WITH GFR
AG Ratio: 2 (calc) (ref 1.0–2.5)
ALT: 31 U/L — ABNORMAL HIGH (ref 6–29)
AST: 24 U/L (ref 10–35)
Albumin: 4.5 g/dL (ref 3.6–5.1)
Alkaline phosphatase (APISO): 101 U/L (ref 37–153)
BUN: 19 mg/dL (ref 7–25)
CO2: 28 mmol/L (ref 20–32)
Calcium: 9.8 mg/dL (ref 8.6–10.4)
Chloride: 102 mmol/L (ref 98–110)
Creat: 0.99 mg/dL (ref 0.50–1.05)
GFR, Est African American: 73 mL/min/{1.73_m2} (ref >=60)
GFR, Est Non African American: 63 mL/min/{1.73_m2} (ref >=60)
Globulin: 2.3 g/dL (calc) (ref 1.9–3.7)
Glucose, Bld: 102 mg/dL — ABNORMAL HIGH (ref 65–99)
Potassium: 3.4 mmol/L — ABNORMAL LOW (ref 3.5–5.3)
Sodium: 140 mmol/L (ref 135–146)
Total Bilirubin: 0.5 mg/dL (ref 0.2–1.2)
Total Protein: 6.8 g/dL (ref 6.1–8.1)

## 2019-05-22 LAB — CBC WITH DIFFERENTIAL/PLATELET
Absolute Monocytes: 450 cells/uL (ref 200–950)
Basophils Absolute: 59 cells/uL (ref 0–200)
Basophils Relative: 1.3 %
Eosinophils Absolute: 81 cells/uL (ref 15–500)
Eosinophils Relative: 1.8 %
HCT: 41.4 % (ref 35.0–45.0)
Hemoglobin: 14.3 g/dL (ref 11.7–15.5)
Lymphs Abs: 1364 cells/uL (ref 850–3900)
MCH: 30.7 pg (ref 27.0–33.0)
MCHC: 34.5 g/dL (ref 32.0–36.0)
MCV: 88.8 fL (ref 80.0–100.0)
MPV: 10.2 fL (ref 7.5–12.5)
Monocytes Relative: 10 %
Neutro Abs: 2547 cells/uL (ref 1500–7800)
Neutrophils Relative %: 56.6 %
Platelets: 213 10*3/uL (ref 140–400)
RBC: 4.66 10*6/uL (ref 3.80–5.10)
RDW: 13 % (ref 11.0–15.0)
Total Lymphocyte: 30.3 %
WBC: 4.5 10*3/uL (ref 3.8–10.8)

## 2019-05-22 LAB — LIPID PANEL
Cholesterol: 162 mg/dL (ref <200)
HDL: 54 mg/dL (ref >=50)
LDL Cholesterol (Calc): 75 mg/dL (calc)
Non-HDL Cholesterol (Calc): 108 mg/dL (calc) (ref <130)
Total CHOL/HDL Ratio: 3 (calc) (ref <5.0)
Triglycerides: 253 mg/dL — ABNORMAL HIGH (ref <150)

## 2019-05-22 LAB — HEMOGLOBIN A1C
Hgb A1c MFr Bld: 6 % of total Hgb — ABNORMAL HIGH (ref <5.7)
Mean Plasma Glucose: 126 (calc)
eAG (mmol/L): 7 (calc)

## 2019-05-22 LAB — TSH: TSH: 2.25 mIU/L (ref 0.40–4.50)

## 2019-05-23 ENCOUNTER — Encounter: Payer: Self-pay | Admitting: Internal Medicine

## 2019-05-23 ENCOUNTER — Ambulatory Visit (INDEPENDENT_AMBULATORY_CARE_PROVIDER_SITE_OTHER): Payer: BC Managed Care – PPO | Admitting: Internal Medicine

## 2019-05-23 ENCOUNTER — Other Ambulatory Visit: Payer: Self-pay

## 2019-05-23 VITALS — BP 102/70 | HR 74 | Temp 98.0°F | Ht 64.5 in | Wt 178.0 lb

## 2019-05-23 DIAGNOSIS — Z8669 Personal history of other diseases of the nervous system and sense organs: Secondary | ICD-10-CM

## 2019-05-23 DIAGNOSIS — E039 Hypothyroidism, unspecified: Secondary | ICD-10-CM | POA: Diagnosis not present

## 2019-05-23 DIAGNOSIS — Z789 Other specified health status: Secondary | ICD-10-CM | POA: Diagnosis not present

## 2019-05-23 DIAGNOSIS — I10 Essential (primary) hypertension: Secondary | ICD-10-CM | POA: Diagnosis not present

## 2019-05-23 DIAGNOSIS — Z0000 Encounter for general adult medical examination without abnormal findings: Secondary | ICD-10-CM | POA: Diagnosis not present

## 2019-05-23 DIAGNOSIS — R7302 Impaired glucose tolerance (oral): Secondary | ICD-10-CM

## 2019-05-23 DIAGNOSIS — E782 Mixed hyperlipidemia: Secondary | ICD-10-CM

## 2019-05-23 LAB — POCT URINALYSIS DIPSTICK
Appearance: NEGATIVE
Bilirubin, UA: NEGATIVE
Blood, UA: NEGATIVE
Glucose, UA: NEGATIVE
Ketones, UA: NEGATIVE
Leukocytes, UA: NEGATIVE
Nitrite, UA: NEGATIVE
Odor: NEGATIVE
Protein, UA: NEGATIVE
Spec Grav, UA: 1.01 (ref 1.010–1.025)
Urobilinogen, UA: 0.2 E.U./dL
pH, UA: 6.5 (ref 5.0–8.0)

## 2019-05-23 MED ORDER — POTASSIUM CHLORIDE ER 10 MEQ PO TBCR: EXTENDED_RELEASE_TABLET | ORAL | 3 refills | Status: DC

## 2019-05-23 NOTE — Patient Instructions (Addendum)
It was a pleasure to see you today. Continue diet and exercise efforts. Continue to monitor BP at home. RTC in one year or as needed. No change in medications.

## 2019-05-23 NOTE — Progress Notes (Signed)
Subjective:    Patient ID: [name] [name], female    DOB: 06-09-1960, 59 y.o.   MRN: [id number]  HPI 59 year old Female for health maintenance exam and evaluation of medical issues.  History of hyperlipidemia and hypertension.  She is statin intolerant and is now being treated at the lipid clinic with good success.  Has gained 4 pounds since January 2020.  Used to be more physically active playing tennis but does not do that now.  History of migraine headaches seen by Dr. Jannifer Franklin September 2019 treated with low-dose Topamax.  History of varicose veins seen by vascular surgery.  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  She is asymptomatic.  She saw  pulmonologist who did not think systemic steroids were necessary.  She had pulmonary function test as well as CT of the chest.  No evidence of primary pulmonary airway process.  Takes Effexor for history of mood disorder  History of mild glucose intolerance.  History of hypertension treated with amlodipine and HCTZ.  SHx: Married. 2 adult daughters. Nonsmoker. Social ETOH. Homemaker.College degree.Husband is a Stage manager.  Family history: Father deceased with history of metastatic prostate cancer.  Father has had CABG x3.  He has a history of hypertension.  Sister passed away from renal cancer with history of hypertension and hyperlipidemia.  Brother with history of alcohol abuse, diabetes and hypertension.  Surgical history: Cesarean sections x2 in 1992 in 1993.  Laparoscopic appendectomy 2016.  History of tonsillectomy.   History of tubal ligation with abdominoplasty 1994.  Benign breast biopsy 1993.  Hospitalized with Campylobacter diarrhea in 2001.     Review of Systems  Constitutional: Negative.   Respiratory: Negative.   Cardiovascular: Negative.   Gastrointestinal: Negative.   Genitourinary: Negative.   Neurological: Negative.   Psychiatric/Behavioral:  Negative.        Objective:   Physical Exam Vitals reviewed.  Constitutional:      General: She is not in acute distress.    Appearance: Normal appearance.  HENT:     Head: Normocephalic.     Right Ear: Tympanic membrane normal.     Left Ear: Tympanic membrane normal.     Nose: Nose normal.  Eyes:     Extraocular Movements: Extraocular movements intact.     Conjunctiva/sclera: Conjunctivae normal.     Pupils: Pupils are equal, round, and reactive to light.  Neck:     Vascular: No carotid bruit.  Cardiovascular:     Rate and Rhythm: Normal rate and regular rhythm.     Pulses: Normal pulses.     Heart sounds: Normal heart sounds. No murmur.  Pulmonary:     Effort: Pulmonary effort is normal.     Breath sounds: Normal breath sounds. No wheezing.  Abdominal:     Palpations: There is no mass.     Tenderness: There is no guarding or rebound.  Genitourinary:    Comments: Deferred to GYN physician Musculoskeletal:     Cervical back: Neck supple.     Right lower leg: No edema.     Left lower leg: No edema.  Lymphadenopathy:     Cervical: No cervical adenopathy.  Skin:    General: Skin is warm and dry.  Neurological:     General: No focal deficit present.     Mental Status: She is alert.  Psychiatric:        Mood and Affect: Mood normal.  Thought Content: Thought content normal.        Judgment: Judgment normal.           Assessment & Plan:  Normal health maintenance exam  History of essential hypertension stable on current regimen  History of migraine headaches  Hyperlipidemia treated with Repatha and Zetia by lipid clinic.  Triglycerides are elevated at 253 otherwise remainder of lipid panel is within normal limits.  Depression treated with Effexor and stable  Impaired glucose tolerance with stable hemoglobin A1c of 6%.  Fasting glucose is 102.  Plan: Has second dose of COVID-19 vaccine in early February.  Continue current medications and return in 1  year or as needed.

## 2019-05-27 ENCOUNTER — Other Ambulatory Visit: Payer: Self-pay | Admitting: Neurology

## 2019-05-27 ENCOUNTER — Other Ambulatory Visit: Payer: Self-pay | Admitting: Internal Medicine

## 2019-05-30 NOTE — Progress Notes (Signed)
PATIENT: [name] [name] DOB: [birth year]  REASON FOR VISIT: follow up HISTORY FROM: patient  HISTORY OF PRESENT ILLNESS: Today 06/03/19  [name] [name] is a 59 year old female with history of migraine headache.  She has done very well on low-dose Topamax, if she tries to come off medication she will began having migraines.  She is also taking Effexor for headaches and depression.  If she tries to come off Effexor, she feels very down.  She has reported some troubles with memory, difficulty recalling names.  Overall, her headaches do very well as long as she takes her medication.  Her overall health is well, she does have hypertension, elevated cholesterol. She works as an Training and development officer.  She denies any new problems or concerns.  She presents today for follow-up unaccompanied.  HISTORY 01/05/2018 Dr. Jannifer Franklin: [name] [name] is a 59 year old right-handed white female with a history of migraine headache.  The patient has done very well even on a low-dose of the Topamax taking 25 mg daily.  If she tries to come off the medication, she will begin having migraine within 2 days.  She occasionally may have a dull pain behind the right eye when she goes to sleep at night, but she never develops a migraine.  She has Maxalt to take at home but she does not take the medication as she does not need to.  The patient remains on Effexor as well.  She came off of Effexor for 8 months but felt poorly, she went back on the medication.  The patient does report some mild problems with remembering names for people, this has been life long problem, but she is becoming a bit more concerned about that as she gets older.  The patient returns for an evaluation.  REVIEW OF SYSTEMS: Out of a complete 14 system review of symptoms, the patient complains only of the following symptoms, and all other reviewed systems are negative.  Headache  ALLERGIES: Allergies  Allergen Reactions  . Statins Other (See Comments)    Cognitive impairment  - Atorvastatin, Rosuvastatin, Pravastatin, Simvastatin (high and low doses)  . Other     Anything with venom  . Ramipril Cough  . Sulfa Antibiotics Other (See Comments)    Itching.  . Yellow Jacket Venom [Bee Venom] Hives, Swelling and Rash    HOME MEDICATIONS: Outpatient Medications Prior to Visit  Medication Sig Dispense Refill  . acetaminophen (TYLENOL) 650 MG CR tablet Take 1 tablet (650 mg total) by mouth every 8 (eight) hours as needed for pain. 90 tablet 3  . amLODipine (NORVASC) 5 MG tablet TAKE 1 TABLET BY MOUTH DAILY 90 tablet 2  . ezetimibe (ZETIA) 10 MG tablet TAKE ONE TABLET BY MOUTH ONE TIME DAILY  90 tablet 2  . hydrochlorothiazide (HYDRODIURIL) 25 MG tablet TAKE 1 TABLET BY MOUTH DAILY 90 tablet 2  . levothyroxine (SYNTHROID) 50 MCG tablet TAKE ONE TABLET BY MOUTH ONE TIME DAILY BEFORE BREAKFAST 90 tablet 2  . potassium chloride (KLOR-CON) 10 MEQ tablet TAKE 1 TABLET(10 MEQ) BY MOUTH DAILY 90 tablet 3  . REPATHA SURECLICK 235 MG/ML SOAJ INJECT 1 PEN (140MG ) SUBCUTANEOUSLY EVERY 14 DAYS 2 mL 0  . topiramate (TOPAMAX) 25 MG tablet TAKE 1 TABLET(25 MG) BY MOUTH DAILY 90 tablet 3  . venlafaxine XR (EFFEXOR-XR) 75 MG 24 hr capsule TAKE ONE CAPSULE BY MOUTH DAILY WITH BREAKFAST 90 capsule 2   No facility-administered medications prior to visit.    PAST MEDICAL HISTORY: Past Medical History:  Diagnosis Date  . Abnormal Pap smear of cervix    56yrs ago  . Classic migraine 09/18/2013  . Diplopia 09/18/2013  . Diverticulitis   . Generalized headaches   . Hyperlipidemia   . Hypertension     PAST SURGICAL HISTORY: Past Surgical History:  Procedure Laterality Date  . ABDOMINAL HYSTERECTOMY    . BREAST SURGERY     reduction  . CERVICAL CONE BIOPSY     81yrs ago  . CESAREAN SECTION     x 2  . LAPAROSCOPIC APPENDECTOMY N/A 07/31/2014   Procedure: APPENDECTOMY LAPAROSCOPIC;  Surgeon: Ovidio Kin, MD;  Location: [facility];  Service: General;  Laterality: N/A;  . REDUCTION  MAMMAPLASTY    . tendon surgery on elbow    . TONSILLECTOMY    . TUBAL LIGATION      FAMILY HISTORY: Family History  Problem Relation Age of Onset  . Prostate cancer Father   . Heart disease Father 50       CABGs x 3  . Hypertension Father   . Hyperlipidemia Sister   . Hypertension Sister   . Renal cancer Sister   . Other Sister        bile duct cancer  . Alcohol abuse Brother   . Diabetes Brother        PVD  . Hypertension Brother   . Cancer Brother   . Hypertension Brother   . Hypertension Brother   . Ovarian cancer Maternal Grandmother     SOCIAL HISTORY: Social History   Socioeconomic History  . Marital status: Married    Spouse name: Not on file  . Number of children: 2  . Years of education: college  . Highest education level: Not on file  Occupational History  . Occupation: Tree surgeon  Tobacco Use  . Smoking status: Never Smoker  . Smokeless tobacco: Never Used  Substance and Sexual Activity  . Alcohol use: Yes    Comment: 0-3 a day  . Drug use: No  . Sexual activity: Yes    Birth control/protection: Surgical    Comment: hysterectomy  Other Topics Concern  . Not on file  Social History Narrative   Lives at home w/ her husband   Right-handed   Caffeine: weekly coffee and daily hot tea   Social Determinants of Health   Financial Resource Strain:   . Difficulty of Paying Living Expenses: Not on file  Food Insecurity:   . Worried About Programme researcher, broadcasting/film/video in the Last Year: Not on file  . Ran Out of Food in the Last Year: Not on file  Transportation Needs:   . Lack of Transportation (Medical): Not on file  . Lack of Transportation (Non-Medical): Not on file  Physical Activity:   . Days of Exercise per Week: Not on file  . Minutes of Exercise per Session: Not on file  Stress:   . Feeling of Stress : Not on file  Social Connections:   . Frequency of Communication with Friends and Family: Not on file  . Frequency of Social Gatherings with Friends  and Family: Not on file  . Attends Religious Services: Not on file  . Active Member of Clubs or Organizations: Not on file  . Attends Banker Meetings: Not on file  . Marital Status: Not on file  Intimate Partner Violence:   . Fear of Current or Ex-Partner: Not on file  . Emotionally Abused: Not on file  . Physically Abused: Not on file  .  Sexually Abused: Not on file   PHYSICAL EXAM  Vitals:   06/03/19 0844  BP: 122/86  Pulse: 88  Temp: (!) 97.3 F (36.3 C)  Weight: 178 lb (80.7 kg)  Height: 5' 4.5" (1.638 m)   Body mass index is 30.08 kg/m.  Generalized: Well developed, in no acute distress   Neurological examination  Mentation: Alert oriented to time, place, history taking. Follows all commands speech and language fluent Cranial nerve II-XII: Pupils were equal round reactive to light. Extraocular movements were full, visual field were full on confrontational test. Facial sensation and strength were normal. . Head turning and shoulder shrug  were normal and symmetric. Motor: The motor testing reveals 5 over 5 strength of all 4 extremities. Good symmetric motor tone is noted throughout.  Sensory: Sensory testing is intact to soft touch on all 4 extremities. No evidence of extinction is noted.  Coordination: Cerebellar testing reveals good finger-nose-finger and heel-to-shin bilaterally.  Gait and station: Gait is normal. Tandem gait is normal. Romberg is negative. No drift is seen.  Reflexes: Deep tendon reflexes are symmetric and normal bilaterally.   DIAGNOSTIC DATA (LABS, IMAGING, TESTING) - I reviewed patient records, labs, notes, testing and imaging myself where available.  Lab Results  Component Value Date   WBC 4.5 05/21/2019   HGB 14.3 05/21/2019   HCT 41.4 05/21/2019   MCV 88.8 05/21/2019   PLT 213 05/21/2019      Component Value Date/Time   NA 140 05/21/2019 0901   K 3.4 (L) 05/21/2019 0901   CL 102 05/21/2019 0901   CO2 28 05/21/2019 0901    GLUCOSE 102 (H) 05/21/2019 0901   BUN 19 05/21/2019 0901   CREATININE 0.99 05/21/2019 0901   CALCIUM 9.8 05/21/2019 0901   PROT 6.8 05/21/2019 0901   ALBUMIN 4.1 12/12/2016 1006   AST 24 05/21/2019 0901   ALT 31 (H) 05/21/2019 0901   ALKPHOS 104 12/12/2016 1006   BILITOT 0.5 05/21/2019 0901   GFRNONAA 63 05/21/2019 0901   GFRAA 73 05/21/2019 0901   Lab Results  Component Value Date   CHOL 162 05/21/2019   HDL 54 05/21/2019   LDLCALC 75 05/21/2019   LDLDIRECT 130.7 11/27/2008   TRIG 253 (H) 05/21/2019   CHOLHDL 3.0 05/21/2019   Lab Results  Component Value Date   HGBA1C 6.0 (H) 05/21/2019   Lab Results  Component Value Date   VITAMINB12 386 09/18/2013   Lab Results  Component Value Date   TSH 2.25 05/21/2019    ASSESSMENT AND PLAN 59 y.o. year old female  has a past medical history of Abnormal Pap smear of cervix, Classic migraine (09/18/2013), Diplopia (09/18/2013), Diverticulitis, Generalized headaches, Hyperlipidemia, and Hypertension. here with:  1.  Common migraine 2.  Reported memory disturbance  Overall, her headaches are under excellent control as long as she takes the medication.  She will remain on Topamax, also Effexor prescribed by PCP.  She has difficulty with remembering names of people, all other cognitive functions appear to be normal.  She will follow-up in 1 year or sooner if needed.  I spent 15 minutes with the patient. 50% of this time was spent discussing her plan of care.   Margie Ege, AGNP-C, DNP 06/03/2019, 8:48 AM [facility] [address] [place], [place] [address] [phone]

## 2019-06-03 ENCOUNTER — Other Ambulatory Visit: Payer: Self-pay

## 2019-06-03 ENCOUNTER — Ambulatory Visit (INDEPENDENT_AMBULATORY_CARE_PROVIDER_SITE_OTHER): Payer: BC Managed Care – PPO | Admitting: Neurology

## 2019-06-03 ENCOUNTER — Encounter: Payer: Self-pay | Admitting: Neurology

## 2019-06-03 VITALS — BP 122/86 | HR 88 | Temp 97.3°F | Ht 64.5 in | Wt 178.0 lb

## 2019-06-03 DIAGNOSIS — G43109 Migraine with aura, not intractable, without status migrainosus: Secondary | ICD-10-CM | POA: Diagnosis not present

## 2019-06-03 MED ORDER — TOPIRAMATE 25 MG PO TABS: ORAL_TABLET | ORAL | 3 refills | Status: DC

## 2019-06-03 NOTE — Patient Instructions (Signed)
It was great to meet you!  I will refill your Topamax for 1 year   See you in 1 year or sooner if needed!

## 2019-06-03 NOTE — Progress Notes (Signed)
I have read the note, and I agree with the clinical assessment and plan.  Rossi Silvestro K Javarie Crisp   

## 2019-06-07 ENCOUNTER — Ambulatory Visit: Payer: BC Managed Care – PPO | Attending: Internal Medicine

## 2019-06-07 DIAGNOSIS — Z23 Encounter for immunization: Secondary | ICD-10-CM

## 2019-06-07 NOTE — Progress Notes (Signed)
   Covid-19 Vaccination Clinic  Name:  Vannah Nadal    MRN: [id number] DOB: [birth year]  06/07/2019  Ms. Critzer was observed post Covid-19 immunization for 15 minutes without incidence. She was provided with Vaccine Information Sheet and instruction to access the V-Safe system.   Ms. Economos was instructed to call 911 with any severe reactions post vaccine: Marland Kitchen Difficulty breathing  . Swelling of your face and throat  . A fast heartbeat  . A bad rash all over your body  . Dizziness and weakness    Immunizations Administered    Name Date Dose VIS Date Route   Pfizer COVID-19 Vaccine 06/07/2019  9:06 AM 0.3 mL 04/12/2019 Intramuscular   Manufacturer: [employer], [employer]   Lot: [id number]   NDC: [address]-4

## 2019-07-22 ENCOUNTER — Encounter: Payer: Self-pay | Admitting: Certified Nurse Midwife

## 2019-09-16 ENCOUNTER — Other Ambulatory Visit: Payer: Self-pay | Admitting: Internal Medicine

## 2019-09-16 DIAGNOSIS — R7309 Other abnormal glucose: Secondary | ICD-10-CM

## 2019-09-16 DIAGNOSIS — E782 Mixed hyperlipidemia: Secondary | ICD-10-CM | POA: Diagnosis not present

## 2019-09-16 LAB — LIPID PANEL
Chol/HDL Ratio: 2.8 ratio (ref 0.0–4.4)
Cholesterol, Total: 152 mg/dL (ref 100–199)
HDL: 54 mg/dL (ref >39)
LDL Chol Calc (NIH): 72 mg/dL (ref 0–99)
Triglycerides: 149 mg/dL (ref 0–149)
VLDL Cholesterol Cal: 26 mg/dL (ref 5–40)

## 2019-09-17 LAB — HEMOGLOBIN A1C
Est. average glucose Bld gHb Est-mCnc: 114 mg/dL
Hgb A1c MFr Bld: 5.6 % (ref 4.8–5.6)

## 2019-09-19 ENCOUNTER — Other Ambulatory Visit: Payer: Self-pay

## 2019-09-19 ENCOUNTER — Encounter: Payer: Self-pay | Admitting: Internal Medicine

## 2019-09-19 ENCOUNTER — Ambulatory Visit (INDEPENDENT_AMBULATORY_CARE_PROVIDER_SITE_OTHER): Payer: BC Managed Care – PPO | Admitting: Internal Medicine

## 2019-09-19 VITALS — BP 127/80 | HR 80 | Temp 97.2°F | Ht 65.0 in | Wt 171.6 lb

## 2019-09-19 DIAGNOSIS — E782 Mixed hyperlipidemia: Secondary | ICD-10-CM

## 2019-09-19 DIAGNOSIS — Z789 Other specified health status: Secondary | ICD-10-CM

## 2019-09-19 NOTE — Patient Instructions (Signed)
Medication Instructions:  Your physician recommends that you continue on your current medications as directed. Please refer to the Current Medication list given to you today.  *If you need a refill on your cardiac medications before your next appointment, please call your pharmacy*   Lab Work: FASTING lipid panel in 1 year (before next appointment)  If you have labs (blood work) drawn today and your tests are completely normal, you will receive your results only by: Marland Kitchen MyChart Message (if you have MyChart) OR . A paper copy in the mail If you have any lab test that is abnormal or we need to change your treatment, we will call you to review the results.   Testing/Procedures: NONE   Follow-Up: At [facility], you and your health needs are our priority.  As part of our continuing mission to provide you with exceptional heart care, we have created designated Provider Care Teams.  These Care Teams include your primary Cardiologist (physician) and Advanced Practice Providers (APPs -  Physician Assistants and Nurse Practitioners) who all work together to provide you with the care you need, when you need it.  We recommend signing up for the patient portal called "MyChart".  Sign up information is provided on this After Visit Summary.  MyChart is used to connect with patients for Virtual Visits (Telemedicine).  Patients are able to view lab/test results, encounter notes, upcoming appointments, etc.  Non-urgent messages can be sent to your provider as well.   To learn more about what you can do with MyChart, go to [email or web address].    Your next appointment:   12 month(s) - lipid clinic  The format for your next appointment:   Either In Person or Virtual  Provider:   K. Italy Hilty, MD   Other Instructions

## 2019-09-19 NOTE — Progress Notes (Signed)
Chief Complaint:  Follow-up dyslipidemia  Primary Care Physician: Elby Showers, MD  HPI:  [name] [name] is a 59 y.o. female who is being seen today for the evaluation of dyslipidemia and statin intolerance at the request of Baxley, Cresenciano Lick, MD. This is a is a 59 year old female patient of Dr. Renold Genta, who is kindly referred to me for evaluation of marked dyslipidemia.  She is previously seen my partner Dr. Percival Spanish, for evaluation of dyslipidemia.  In the past he is ordered a coronary artery calcium score which was more performed in November 2017.  This demonstrated 0 coronary artery calcification.  While this is reassuring, she is at significant increased risk based on family history.  Of note her family history is significant for premature onset heart disease in her father who had MI and numerous bypasses starting in his late 47s.  She also had a brother with significant PAD who died prematurely.  There is a sister who had early onset heart disease and 2 other brothers who are on statin therapy for dyslipidemia.  This pattern is suggestive of familial hyperlipidemia.  In addition, her most recent lipid profile showed total cholesterol of 295, HDL 66, triglycerides 172 and LDL-C 195.  She reports eating a very healthy diet, in fact a number of years ago she went completely vegetarian for at least one year.  Not only does she feel like it did not improve her lipid profile, she says she thinks her cholesterol actually was higher.  She is gone back to eating red meats once or twice every couple weeks, and does eat shellfish however once or twice a week and eats about 4 eggs per week.  Most of her diet consists of vegetables and fruits.  Unfortunately she has been statin intolerant as well.  She is been on a number of medications in the past including atorvastatin, rosuvastatin, simvastatin and pravastatin at low and high doses.  Although she has not had myalgias, she has had significant cognitive  impairment which has improved with discontinuing the medications fairly promptly.  She was referred for evaluation of possible PCSK9 inhibitor therapy.  11/30/2017  [name] [name] returns today for follow-up.  She seems to be doing well on Repatha.  Unfortunately her last injection she injected a little to anterior of the thigh and probably had a blood vessel.  This caused some pain and she redrew the needle before it was completely injected.  She would not likely get the full dose.  Despite this she is been doing well and is had a very good reduction in cholesterol.  Her total cholesterol now is only 220 with LDL 119.  Although this is not the 65% reduction we would expect, she was also on ezetimibe and for some reason was told to discontinue this.  I had intended her to continue on ezetimibe with this.  Subsequently she is restarted ezetimibe.  She denies any cognitive impairments.  03/05/2018  [name] [name] is seen today in follow-up.  She is very pleased with her control dyslipidemia.  She is tolerating Repatha and was injecting it into her abdomen.  Her total cholesterols come down significantly to 156 with HDL 61 and LDL 45.  Triglycerides are fluctuating and are elevated at 249 today, but have been up and down.  Overall her cardiovascular risk is reduced significantly on this regimen and she is additionally taking ezetimibe.  She reports her medications are well-tolerated and is not having any the side effects she  had with the statins.  This is very aggressive therapy considering she had no detectable coronary disease although does have a strong family history of heart disease.  09/19/2019  [name] [name] is seen today in follow-up.  She has made major changes in her diet.  She cut out pretty much all carbohydrates and started eating more meat.  She is also doing intermittent fasting, not eating 24 hours 2 days/week.  She says this is made major differences in her numbers and in fact her lipid profile has  improved.  Total cholesterol is 152, triglycerides 149, HDL 54 and LDL of 72.  She remains on Repatha.  Hemoglobin A1c is also decreased from 6.0-5.6.  PMHx:  Past Medical History:  Diagnosis Date   Abnormal Pap smear of cervix    31yrs ago   Classic migraine 09/18/2013   Diplopia 09/18/2013   Diverticulitis    Generalized headaches    Hyperlipidemia    Hypertension     Past Surgical History:  Procedure Laterality Date   ABDOMINAL HYSTERECTOMY     BREAST SURGERY     reduction   CERVICAL CONE BIOPSY     96yrs ago   CESAREAN SECTION     x 2   LAPAROSCOPIC APPENDECTOMY N/A 07/31/2014   Procedure: APPENDECTOMY LAPAROSCOPIC;  Surgeon: Ovidio Kin, MD;  Location: [facility];  Service: General;  Laterality: N/A;   REDUCTION MAMMAPLASTY     tendon surgery on elbow     TONSILLECTOMY     TUBAL LIGATION      FAMHx:  Family History  Problem Relation Age of Onset   Prostate cancer Father    Heart disease Father 51       CABGs x 3   Hypertension Father    Hyperlipidemia Sister    Hypertension Sister    Renal cancer Sister    Other Sister        bile duct cancer   Alcohol abuse Brother    Diabetes Brother        PVD   Hypertension Brother    Cancer Brother    Hypertension Brother    Hypertension Brother    Ovarian cancer Maternal Grandmother     SOCHx:   reports that she has never smoked. She has never used smokeless tobacco. She reports current alcohol use. She reports that she does not use drugs.  ALLERGIES:  Allergies  Allergen Reactions   Statins Other (See Comments)    Cognitive impairment - Atorvastatin, Rosuvastatin, Pravastatin, Simvastatin (high and low doses)   Other     Anything with venom   Ramipril Cough   Sulfa Antibiotics Other (See Comments)    Itching.   Yellow Jacket Venom [Bee Venom] Hives, Swelling and Rash    ROS: Pertinent items noted in HPI and remainder of comprehensive ROS otherwise negative.  HOME  MEDS: Current Outpatient Medications on File Prior to Visit  Medication Sig Dispense Refill   acetaminophen (TYLENOL) 650 MG CR tablet Take 1 tablet (650 mg total) by mouth every 8 (eight) hours as needed for pain. 90 tablet 3   amLODipine (NORVASC) 5 MG tablet TAKE 1 TABLET BY MOUTH DAILY 90 tablet 2   ezetimibe (ZETIA) 10 MG tablet TAKE ONE TABLET BY MOUTH ONE TIME DAILY  90 tablet 2   hydrochlorothiazide (HYDRODIURIL) 25 MG tablet TAKE 1 TABLET BY MOUTH DAILY 90 tablet 2   levothyroxine (SYNTHROID) 50 MCG tablet TAKE ONE TABLET BY MOUTH ONE TIME DAILY BEFORE BREAKFAST 90 tablet  2   potassium chloride (KLOR-CON) 10 MEQ tablet TAKE 1 TABLET(10 MEQ) BY MOUTH DAILY 90 tablet 3   REPATHA SURECLICK 140 MG/ML SOAJ INJECT 1 PEN (140MG ) SUBCUTANEOUSLY EVERY 14 DAYS 2 mL 0   topiramate (TOPAMAX) 25 MG tablet TAKE 1 TABLET(25 MG) BY MOUTH DAILY 90 tablet 3   venlafaxine XR (EFFEXOR-XR) 75 MG 24 hr capsule TAKE ONE CAPSULE BY MOUTH DAILY WITH BREAKFAST 90 capsule 2   No current facility-administered medications on file prior to visit.    LABS/IMAGING: No results found for this or any previous visit (from the past 48 hour(s)). No results found.  LIPID PANEL:    Component Value Date/Time   CHOL 152 09/16/2019 0919   TRIG 149 09/16/2019 0919   HDL 54 09/16/2019 0919   CHOLHDL 2.8 09/16/2019 0919   CHOLHDL 3.0 05/21/2019 0901   VLDL 70 (H) 12/12/2016 1006   LDLCALC 72 09/16/2019 0919   LDLCALC 75 05/21/2019 0901   LDLDIRECT 130.7 11/27/2008 1141    WEIGHTS: Wt Readings from Last 3 Encounters:  09/19/19 171 lb 9.6 oz (77.8 kg)  06/03/19 178 lb (80.7 kg)  05/23/19 178 lb (80.7 kg)    VITALS: BP 127/80    Pulse 80    Temp (!) 97.2 F (36.2 C)    Ht 5\' 5"  (1.651 m)    Wt 171 lb 9.6 oz (77.8 kg)    SpO2 97%    BMI 28.56 kg/m   EXAM: Deferred  EKG: Deferred  ASSESSMENT: 1. 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 No CAC  (03/2016) 3. Strong family history of premature CAD in mother, brothers and sister 66. Statin intolerance  PLAN: 1.   [name] [name] has made significant dietary changes including higher protein, lower carbohydrates and intermittent fasting which is improved her numbers.  A1c now is 5.6.  LDL 72 and triglycerides are at target.  No changes in her medicines today.  I would continue the Repatha and we will plan to see her back annually or sooner as necessary.  10, MD, [facility], FACP  [place]   [facility]  Medical Director of the [facility] Lipid Disorders &  Cardiovascular Risk Reduction Clinic Diplomate of the American Board of Clinical Lipidology Attending Cardiologist  Direct Dial: [phone]   Fax: [phone]  Website:  [email or web address] Mikel Pyon 09/19/2019, 9:58 AM

## 2019-10-02 ENCOUNTER — Other Ambulatory Visit: Payer: Self-pay | Admitting: Internal Medicine

## 2019-10-02 DIAGNOSIS — E782 Mixed hyperlipidemia: Secondary | ICD-10-CM

## 2019-12-06 ENCOUNTER — Other Ambulatory Visit: Payer: Self-pay | Admitting: Internal Medicine

## 2019-12-09 DIAGNOSIS — Z20822 Contact with and (suspected) exposure to covid-19: Secondary | ICD-10-CM | POA: Diagnosis not present

## 2020-02-24 ENCOUNTER — Other Ambulatory Visit: Payer: Self-pay | Admitting: Internal Medicine

## 2020-02-24 NOTE — Telephone Encounter (Signed)
CPE due January 2022. Please book before refilling.

## 2020-02-24 NOTE — Telephone Encounter (Signed)
Left message to call back  

## 2020-03-10 ENCOUNTER — Ambulatory Visit: Payer: BC Managed Care – PPO | Admitting: Certified Nurse Midwife

## 2020-04-17 ENCOUNTER — Other Ambulatory Visit: Payer: Self-pay | Admitting: Internal Medicine

## 2020-04-17 DIAGNOSIS — Z1231 Encounter for screening mammogram for malignant neoplasm of breast: Secondary | ICD-10-CM

## 2020-04-30 ENCOUNTER — Telehealth: Payer: Self-pay | Admitting: Internal Medicine

## 2020-04-30 NOTE — Telephone Encounter (Signed)
PA for repatha submitted via CMM (Key: [id number])

## 2020-05-07 NOTE — Telephone Encounter (Signed)
PA submitted via CMM with new key generated from pharmacy  (Key: [id number]) - [id number]

## 2020-05-11 NOTE — Telephone Encounter (Signed)
Patient has been approved for repatha from 05/11/2020. No end date specified

## 2020-05-19 ENCOUNTER — Other Ambulatory Visit: Payer: Self-pay | Admitting: Internal Medicine

## 2020-05-20 ENCOUNTER — Other Ambulatory Visit: Payer: Self-pay | Admitting: Internal Medicine

## 2020-05-22 ENCOUNTER — Other Ambulatory Visit: Payer: BC Managed Care – PPO | Admitting: Internal Medicine

## 2020-05-22 ENCOUNTER — Other Ambulatory Visit: Payer: Self-pay

## 2020-05-22 DIAGNOSIS — E039 Hypothyroidism, unspecified: Secondary | ICD-10-CM

## 2020-05-22 DIAGNOSIS — Z0000 Encounter for general adult medical examination without abnormal findings: Secondary | ICD-10-CM

## 2020-05-22 DIAGNOSIS — R7302 Impaired glucose tolerance (oral): Secondary | ICD-10-CM | POA: Diagnosis not present

## 2020-05-22 DIAGNOSIS — E782 Mixed hyperlipidemia: Secondary | ICD-10-CM | POA: Diagnosis not present

## 2020-05-22 DIAGNOSIS — Z789 Other specified health status: Secondary | ICD-10-CM

## 2020-05-22 DIAGNOSIS — I10 Essential (primary) hypertension: Secondary | ICD-10-CM | POA: Diagnosis not present

## 2020-05-23 LAB — COMPLETE METABOLIC PANEL WITH GFR
AG Ratio: 2.1 (calc) (ref 1.0–2.5)
ALT: 26 U/L (ref 6–29)
AST: 21 U/L (ref 10–35)
Albumin: 4.9 g/dL (ref 3.6–5.1)
Alkaline phosphatase (APISO): 97 U/L (ref 37–153)
BUN: 24 mg/dL (ref 7–25)
CO2: 27 mmol/L (ref 20–32)
Calcium: 10.2 mg/dL (ref 8.6–10.4)
Chloride: 103 mmol/L (ref 98–110)
Creat: 0.77 mg/dL (ref 0.50–1.05)
GFR, Est African American: 98 mL/min/{1.73_m2} (ref >=60)
GFR, Est Non African American: 85 mL/min/{1.73_m2} (ref >=60)
Globulin: 2.3 g/dL (calc) (ref 1.9–3.7)
Glucose, Bld: 107 mg/dL — ABNORMAL HIGH (ref 65–99)
Potassium: 4.2 mmol/L (ref 3.5–5.3)
Sodium: 140 mmol/L (ref 135–146)
Total Bilirubin: 0.6 mg/dL (ref 0.2–1.2)
Total Protein: 7.2 g/dL (ref 6.1–8.1)

## 2020-05-23 LAB — CBC WITH DIFFERENTIAL/PLATELET
Absolute Monocytes: 462 cells/uL (ref 200–950)
Basophils Absolute: 60 cells/uL (ref 0–200)
Basophils Relative: 1 %
Eosinophils Absolute: 138 cells/uL (ref 15–500)
Eosinophils Relative: 2.3 %
HCT: 43.4 % (ref 35.0–45.0)
Hemoglobin: 15.3 g/dL (ref 11.7–15.5)
Lymphs Abs: 1572 cells/uL (ref 850–3900)
MCH: 32.3 pg (ref 27.0–33.0)
MCHC: 35.3 g/dL (ref 32.0–36.0)
MCV: 91.8 fL (ref 80.0–100.0)
MPV: 10.3 fL (ref 7.5–12.5)
Monocytes Relative: 7.7 %
Neutro Abs: 3768 cells/uL (ref 1500–7800)
Neutrophils Relative %: 62.8 %
Platelets: 235 10*3/uL (ref 140–400)
RBC: 4.73 10*6/uL (ref 3.80–5.10)
RDW: 12.8 % (ref 11.0–15.0)
Total Lymphocyte: 26.2 %
WBC: 6 10*3/uL (ref 3.8–10.8)

## 2020-05-23 LAB — HEMOGLOBIN A1C
Hgb A1c MFr Bld: 5.9 % of total Hgb — ABNORMAL HIGH (ref <5.7)
Mean Plasma Glucose: 123 mg/dL
eAG (mmol/L): 6.8 mmol/L

## 2020-05-23 LAB — LIPID PANEL
Cholesterol: 218 mg/dL — ABNORMAL HIGH (ref <200)
HDL: 64 mg/dL (ref >=50)
LDL Cholesterol (Calc): 114 mg/dL (calc) — ABNORMAL HIGH
Non-HDL Cholesterol (Calc): 154 mg/dL (calc) — ABNORMAL HIGH (ref <130)
Total CHOL/HDL Ratio: 3.4 (calc) (ref <5.0)
Triglycerides: 298 mg/dL — ABNORMAL HIGH (ref <150)

## 2020-05-23 LAB — TSH: TSH: 3.53 mIU/L (ref 0.40–4.50)

## 2020-05-25 ENCOUNTER — Other Ambulatory Visit: Payer: Self-pay

## 2020-05-25 ENCOUNTER — Ambulatory Visit (INDEPENDENT_AMBULATORY_CARE_PROVIDER_SITE_OTHER): Payer: BC Managed Care – PPO | Admitting: Internal Medicine

## 2020-05-25 ENCOUNTER — Encounter: Payer: Self-pay | Admitting: Internal Medicine

## 2020-05-25 VITALS — BP 130/80 | HR 80 | Ht 65.0 in

## 2020-05-25 DIAGNOSIS — Z8669 Personal history of other diseases of the nervous system and sense organs: Secondary | ICD-10-CM | POA: Diagnosis not present

## 2020-05-25 DIAGNOSIS — Z0000 Encounter for general adult medical examination without abnormal findings: Secondary | ICD-10-CM | POA: Diagnosis not present

## 2020-05-25 DIAGNOSIS — Z8659 Personal history of other mental and behavioral disorders: Secondary | ICD-10-CM

## 2020-05-25 DIAGNOSIS — I10 Essential (primary) hypertension: Secondary | ICD-10-CM

## 2020-05-25 DIAGNOSIS — R7302 Impaired glucose tolerance (oral): Secondary | ICD-10-CM

## 2020-05-25 DIAGNOSIS — Z789 Other specified health status: Secondary | ICD-10-CM

## 2020-05-25 DIAGNOSIS — E782 Mixed hyperlipidemia: Secondary | ICD-10-CM | POA: Diagnosis not present

## 2020-05-25 DIAGNOSIS — E039 Hypothyroidism, unspecified: Secondary | ICD-10-CM

## 2020-05-25 LAB — POCT URINALYSIS DIPSTICK
Appearance: NEGATIVE
Bilirubin, UA: NEGATIVE
Blood, UA: NEGATIVE
Glucose, UA: NEGATIVE
Ketones, UA: NEGATIVE
Leukocytes, UA: NEGATIVE
Nitrite, UA: NEGATIVE
Odor: NEGATIVE
Protein, UA: NEGATIVE
Spec Grav, UA: 1.01 (ref 1.010–1.025)
Urobilinogen, UA: 0.2 E.U./dL
pH, UA: 6.5 (ref 5.0–8.0)

## 2020-05-25 MED ORDER — BUPROPION HCL ER (XL) 150 MG PO TB24: 150.0000 mg | ORAL_TABLET | Freq: Every day | ORAL | 1 refills | Status: DC

## 2020-05-25 MED ORDER — VENLAFAXINE HCL ER 37.5 MG PO CP24: 37.5000 mg | ORAL_CAPSULE | Freq: Every day | ORAL | 0 refills | Status: DC

## 2020-05-25 NOTE — Progress Notes (Unsigned)
Subjective:    Patient ID: [name] [name], female    DOB: 1960-07-02, 60 y.o.   MRN: [id number]  HPI 60 year old Female seen for health maintenance exam and evaluation of medical issues.  History of hyperlipidemia followed by Dr. Rennis Golden.  Is statin intolerant.  Is on Repatha and Zetia.  History of hypertension.  Is worried about her weight gain recently during the pandemic.  Her daughter is getting married in the next few months.  Patient would like to try Wellbutrin.  Her daughter takes it.  She would like to decrease dose of Effexor to 37.5 mg daily thinking it is causing some weight gain.  History of migraine headaches seen by Dr. Anne Hahn September 2019 treated with low-dose Topamax.  History of varicose veins seen by vascular surgery.  There is a remote history of sarcoidosis based on right axillary node biopsy in 2014.  Noncaseating granulomas noted.  She is asymptomatic.  She saw a pulmonologist who did not think systemic steroids were necessary.  She had pulmonary function testing as well as CT of the chest.  There was no evidence of primary pulmonary airway process.  History of mild glucose intolerance.  History of hypertension treated with amlodipine and HCTZ.  Social history: Married.  2 adult daughters.  Non-smoker.  Social alcohol consumption.  Husband is a Marine scientist with [employer].  She is a homemaker with a college degree.  Family history: Father deceased with history of metastatic prostate cancer.  Father had CABG x3.  He had a history of hypertension.  Sister passed away from renal cancer with history of hypertension and hyperlipidemia.  Brother with history of alcohol abuse, diabetes and hypertension.  Patient was hospitalized with Campylobacter diarrhea in 2001.  Surgical history: Cesarean sections x2 in 1992 in 1993.  Laparoscopic appendectomy in 2016.  History of tonsillectomy.  History of tubal ligation with abdominoplasty 1994.  Benign breast biopsy  1993.  Review of Systems  Constitutional: Negative.   Respiratory: Negative.   Cardiovascular: Negative.   Gastrointestinal: Negative.   Genitourinary: Negative.   Neurological: Negative.    see above     Objective:   Physical Exam Blood pressure 130/80 pulse 80 pulse oximetry 95% height 5 feet 5 inches BMI 28.56  Skin is warm and dry.  Nodes none.  TMs are clear.  Neck is supple without JVD thyromegaly or carotid bruits.  Chest is clear to auscultation.  Breasts without masses.  Cardiac exam regular rate and rhythm normal S1 and S2 without murmurs or gallops.  Abdomen is soft nondistended without hepatosplenomegaly masses or tenderness.  No lower extremity pitting edema.  Neuro is intact without focal deficits.  Affect thought and judgment are normal.       Assessment & Plan:  Hyperlipidemia treated at lipid clinic with Repatha and Zetia.  Total cholesterol is 218, triglycerides 298, and LDL 114.  TSH is normal.  Impaired glucose tolerance-hemoglobin A1c 5.9%.  She does not want to be on Metformin.  Continue diet and exercise.  Fasting glucose is 107.  Health maintenance: Has had 3 COVID-19 immunizations.  Had flu vaccine in October.  Colonoscopy done 2015 and tentatively due May 2025.  History of depression treated with Effexor.  Patient would like to try Wellbutrin with Effexor.  She is worried about weight gain.  Although Effexor does not seem to cause weight gain, we are going to reduce the dose to 37.5 mg daily and add Wellbutrin XL 150 mg daily.  History  of migraine headaches treated with Topamax  Plan: She will continue to monitor her blood pressure at home.  She will let me know how things are going with Wellbutrin and Effexor at current dosages.  Leota Sauers, CNM is who she sees for well woman checks.  Last seen November 2020.  She may return in 1 year or as needed.

## 2020-05-25 NOTE — Patient Instructions (Addendum)
It was a pleasure to see you today.  Patient wants to try Wellbutrin.  She is not happy with her weight.  She is going to decrease Effexor to 37.5 mg daily and start Wellbutrin XL 150 mg daily.  She will continue with antihypertensive medication.  Continue follow-up for hyperlipidemia with Dr. Rennis Golden.  She will let me know if antidepressant medication regimen is not working.  Otherwise return in 1 year.

## 2020-05-29 ENCOUNTER — Other Ambulatory Visit: Payer: Self-pay

## 2020-05-29 ENCOUNTER — Ambulatory Visit
Admission: RE | Admit: 2020-05-29 | Discharge: 2020-05-29 | Disposition: A | Discharge status: Home / Self Care | Payer: BC Managed Care – PPO | Source: Ambulatory Visit | Attending: Internal Medicine | Admitting: Internal Medicine

## 2020-05-29 DIAGNOSIS — Z1231 Encounter for screening mammogram for malignant neoplasm of breast: Secondary | ICD-10-CM

## 2020-06-03 ENCOUNTER — Other Ambulatory Visit: Payer: Self-pay

## 2020-06-03 ENCOUNTER — Ambulatory Visit (INDEPENDENT_AMBULATORY_CARE_PROVIDER_SITE_OTHER): Payer: BC Managed Care – PPO | Admitting: Neurology

## 2020-06-03 ENCOUNTER — Encounter: Payer: Self-pay | Admitting: Neurology

## 2020-06-03 VITALS — BP 133/91 | HR 74 | Ht 65.0 in | Wt 174.0 lb

## 2020-06-03 DIAGNOSIS — G43109 Migraine with aura, not intractable, without status migrainosus: Secondary | ICD-10-CM

## 2020-06-03 MED ORDER — TOPIRAMATE 25 MG PO TABS: ORAL_TABLET | ORAL | 3 refills | Status: DC

## 2020-06-03 NOTE — Patient Instructions (Addendum)
Continue Topamax, and plan to wean off the Effexor See you back in 1 year

## 2020-06-03 NOTE — Progress Notes (Signed)
I have read the note, and I agree with the clinical assessment and plan.  Charles K Willis   

## 2020-06-03 NOTE — Progress Notes (Signed)
PATIENT: [name] [name] DOB: [birth year]  REASON FOR VISIT: follow up HISTORY FROM: patient  HISTORY OF PRESENT ILLNESS: Today 06/03/20 [name] [name] is a 60 year old female with history of migraine headache.  Has done very well on low-dose Topamax, if she tries to come off the medication she will start having migraines.  Also on Effexor for headaches and depression. She is currently trying to come off Effexor, has been started on Wellbutrin. Feels the Effexor has resulted in weight gain over the years. It has historically been difficult for her to come off with, makes her emotinal. She is doing a very slow wean, is dropping the dose to 37.5 mg. Migraines are well controlled on average 1-2 a month. May have mild morning headache, that improves when she eats. There is no consistent snoring. Her daughter is getting married in August. She is an Tree surgeon. Here today for evaluation unaccompanied.  HISTORY 06/03/2019 SS: Ms. [name] [name] is a 60 year old female with history of migraine headache.  She has done very well on low-dose Topamax, if she tries to come off medication she will began having migraines.  She is also taking Effexor for headaches and depression.  If she tries to come off Effexor, she feels very down.  She has reported some troubles with memory, difficulty recalling names.  Overall, her headaches do very well as long as she takes her medication.  Her overall health is well, she does have hypertension, elevated cholesterol. She works as an Tree surgeon.  She denies any new problems or concerns.  She presents today for follow-up unaccompanied.  REVIEW OF SYSTEMS: Out of a complete 14 system review of symptoms, the patient complains only of the following symptoms, and all other reviewed systems are negative.  Headache  ALLERGIES: Allergies  Allergen Reactions  . Statins Other (See Comments)    Cognitive impairment - Atorvastatin, Rosuvastatin, Pravastatin, Simvastatin (high and low doses)  .  Other     Anything with venom  . Ramipril Cough  . Sulfa Antibiotics Other (See Comments)    Itching.  . Yellow Jacket Venom [Bee Venom] Hives, Swelling and Rash    HOME MEDICATIONS: Outpatient Medications Prior to Visit  Medication Sig Dispense Refill  . acetaminophen (TYLENOL) 650 MG CR tablet Take 1 tablet (650 mg total) by mouth every 8 (eight) hours as needed for pain. 90 tablet 3  . amLODipine (NORVASC) 5 MG tablet TAKE ONE TABLET BY MOUTH ONE TIME DAILY 90 tablet 0  . buPROPion (WELLBUTRIN XL) 150 MG 24 hr tablet Take 1 tablet (150 mg total) by mouth daily. 90 tablet 1  . ezetimibe (ZETIA) 10 MG tablet TAKE ONE TABLET BY MOUTH ONE TIME DAILY 90 tablet 2  . hydrochlorothiazide (HYDRODIURIL) 25 MG tablet TAKE ONE TABLET BY MOUTH ONE TIME DAILY 90 tablet 0  . levothyroxine (SYNTHROID) 50 MCG tablet TAKE 1 TABLET BY MOUTH ONE TIME DAILY BEFORE BREAKFAST 90 tablet 0  . potassium chloride (KLOR-CON) 10 MEQ tablet TAKE 1 TABLET BY MOUTH DAILY 90 tablet 1  . REPATHA SURECLICK 140 MG/ML SOAJ INJECT ONE DOSE SUBCUTANEOUSLY EVERY 14 DAYS 2 mL 11  . venlafaxine XR (EFFEXOR XR) 37.5 MG 24 hr capsule Take 1 capsule (37.5 mg total) by mouth daily with breakfast. 90 capsule 0  . topiramate (TOPAMAX) 25 MG tablet TAKE 1 TABLET(25 MG) BY MOUTH DAILY 90 tablet 3   No facility-administered medications prior to visit.    PAST MEDICAL HISTORY: Past Medical History:  Diagnosis Date  .  Abnormal Pap smear of cervix    11yrs ago  . Classic migraine 09/18/2013  . Diplopia 09/18/2013  . Diverticulitis   . Generalized headaches   . Hyperlipidemia   . Hypertension     PAST SURGICAL HISTORY: Past Surgical History:  Procedure Laterality Date  . ABDOMINAL HYSTERECTOMY    . BREAST SURGERY     reduction  . CERVICAL CONE BIOPSY     46yrs ago  . CESAREAN SECTION     x 2  . LAPAROSCOPIC APPENDECTOMY N/A 07/31/2014   Procedure: APPENDECTOMY LAPAROSCOPIC;  Surgeon: Ovidio Kin, MD;  Location: [facility];   Service: General;  Laterality: N/A;  . REDUCTION MAMMAPLASTY    . tendon surgery on elbow    . TONSILLECTOMY    . TUBAL LIGATION      FAMILY HISTORY: Family History  Problem Relation Age of Onset  . Prostate cancer Father   . Heart disease Father 50       CABGs x 3  . Hypertension Father   . Hyperlipidemia Sister   . Hypertension Sister   . Renal cancer Sister   . Other Sister        bile duct cancer  . Alcohol abuse Brother   . Diabetes Brother        PVD  . Hypertension Brother   . Cancer Brother   . Hypertension Brother   . Hypertension Brother   . Ovarian cancer Maternal Grandmother     SOCIAL HISTORY: Social History   Socioeconomic History  . Marital status: Married    Spouse name: Not on file  . Number of children: 2  . Years of education: college  . Highest education level: Not on file  Occupational History  . Occupation: Tree surgeon  Tobacco Use  . Smoking status: Never Smoker  . Smokeless tobacco: Never Used  Substance and Sexual Activity  . Alcohol use: Yes    Comment: 0-3 a day  . Drug use: No  . Sexual activity: Yes    Birth control/protection: Surgical    Comment: hysterectomy  Other Topics Concern  . Not on file  Social History Narrative   Lives at home w/ her husband   Right-handed   Caffeine: weekly coffee and daily hot tea   Social Determinants of Health   Financial Resource Strain: Not on file  Food Insecurity: Not on file  Transportation Needs: Not on file  Physical Activity: Not on file  Stress: Not on file  Social Connections: Not on file  Intimate Partner Violence: Not on file   PHYSICAL EXAM  Vitals:   06/03/20 0908  BP: (!) 133/91  Pulse: 74  Weight: 174 lb (78.9 kg)  Height: 5\' 5"  (1.651 m)   Body mass index is 28.96 kg/m.  Generalized: Well developed, in no acute distress   Neurological examination  Mentation: Alert oriented to time, place, history taking. Follows all commands speech and language fluent Cranial  nerve II-XII: Pupils were equal round reactive to light. Extraocular movements were full, visual field were full on confrontational test. Facial sensation and strength were normal.  Head turning and shoulder shrug  were normal and symmetric. Mild head tremor noted. Motor: The motor testing reveals 5 over 5 strength of all 4 extremities. Good symmetric motor tone is noted throughout.  Sensory: Sensory testing is intact to soft touch on all 4 extremities. No evidence of extinction is noted.  Coordination: Cerebellar testing reveals good finger-nose-finger and heel-to-shin bilaterally.  Gait and station: Gait  is normal. Tandem gait is normal. Romberg is negative. No drift is seen.  Reflexes: Deep tendon reflexes are symmetric and normal bilaterally.   DIAGNOSTIC DATA (LABS, IMAGING, TESTING) - I reviewed patient records, labs, notes, testing and imaging myself where available.  Lab Results  Component Value Date   WBC 6.0 05/22/2020   HGB 15.3 05/22/2020   HCT 43.4 05/22/2020   MCV 91.8 05/22/2020   PLT 235 05/22/2020      Component Value Date/Time   NA 140 05/22/2020 1138   K 4.2 05/22/2020 1138   CL 103 05/22/2020 1138   CO2 27 05/22/2020 1138   GLUCOSE 107 (H) 05/22/2020 1138   BUN 24 05/22/2020 1138   CREATININE 0.77 05/22/2020 1138   CALCIUM 10.2 05/22/2020 1138   PROT 7.2 05/22/2020 1138   ALBUMIN 4.1 12/12/2016 1006   AST 21 05/22/2020 1138   ALT 26 05/22/2020 1138   ALKPHOS 104 12/12/2016 1006   BILITOT 0.6 05/22/2020 1138   GFRNONAA 85 05/22/2020 1138   GFRAA 98 05/22/2020 1138   Lab Results  Component Value Date   CHOL 218 (H) 05/22/2020   HDL 64 05/22/2020   LDLCALC 114 (H) 05/22/2020   LDLDIRECT 130.7 11/27/2008   TRIG 298 (H) 05/22/2020   CHOLHDL 3.4 05/22/2020   Lab Results  Component Value Date   HGBA1C 5.9 (H) 05/22/2020   Lab Results  Component Value Date   VITAMINB12 386 09/18/2013   Lab Results  Component Value Date   TSH 3.53 05/22/2020     ASSESSMENT AND PLAN 59 y.o. year old female  has a past medical history of Abnormal Pap smear of cervix, Classic migraine (09/18/2013), Diplopia (09/18/2013), Diverticulitis, Generalized headaches, Hyperlipidemia, and Hypertension. here with:  1. Common migraine headache  Headaches remain stable. She will remain on low-dose Topamax 25 mg daily. She is currently trying to wean off Effexor, she has been started on Wellbutrin. She will follow-up in 1 year or sooner if needed. She has not been able to come off even low-dose Topamax due to worsening headaches.  I spent 20 minutes of face-to-face and non-face-to-face time with patient.  This included previsit chart review, lab review, study review, order entry, electronic health record documentation, patient education.  Margie Ege, AGNP-C, DNP 06/03/2020, 9:45 AM [facility] [address] [place], [place] [address] [phone]

## 2020-07-01 ENCOUNTER — Telehealth: Payer: Self-pay | Admitting: Internal Medicine

## 2020-07-01 NOTE — Telephone Encounter (Signed)
[name] [name] [phone]  Raegan called to say she is going to have some elective surgery done on 07/28/2020 and would like to have labs done here. They have to be done according to her within 90 days of surgery. She also said she needed EKG but it looks like she has contacted DR [facility] office for that.  CBC w/Diff & PLT-- CPT 85025 CMP --- CPT 80053 Hemoglobin A1c  Prothrombin Time with INR -- CPT 85610 Partial Thromboplastin Time, Activated -- CPT 85730  Diagnosis Code Z01.812 (Pre-Operative Labs)   [facility] Any questions call Maralyn Sago [phone] Fax lab results [phone]

## 2020-07-01 NOTE — Telephone Encounter (Signed)
Returned the call to the patient. She stated that she is having an elective procedure on March 29th and the surgeon is requiring an EKG prior. She stated that she can bring the paperwork in that needs to be signed but would also like to know if she can get an EKG.

## 2020-07-01 NOTE — Telephone Encounter (Signed)
Patient states her surgeon is requiring an ekg. She states they did not mention anything about needing clearance and just asked for the ekg signed by Dr. Rennis Golden.

## 2020-07-01 NOTE — Telephone Encounter (Signed)
Ok to get a preop EKG.  Dr Rexene Edison

## 2020-07-02 NOTE — Telephone Encounter (Signed)
Left message to call back regarding NURSE VISIT - EKG   Patient can come by today 07/02/20 in the PM, tomorrow 07/03/20 in the PM, Tuesday 3/8 between 2 and 4pm  Of note, Dr. Rennis Golden will NOT be in the office any of these days - another provider will need to review her EKG.   If clearance form is needing to be completed, should be inputted into Epic per pre-op standard work

## 2020-07-02 NOTE — Telephone Encounter (Signed)
[name] [name] is having Neck Lift and Blephoplsty

## 2020-07-02 NOTE — Telephone Encounter (Signed)
Please find out what operation is to be done so I know. Otherwise Ok to do labs as requested.

## 2020-07-03 ENCOUNTER — Telehealth: Payer: Self-pay | Admitting: Internal Medicine

## 2020-07-03 ENCOUNTER — Other Ambulatory Visit: Payer: Self-pay

## 2020-07-03 ENCOUNTER — Ambulatory Visit (INDEPENDENT_AMBULATORY_CARE_PROVIDER_SITE_OTHER): Payer: BC Managed Care – PPO | Admitting: *Deleted

## 2020-07-03 VITALS — HR 82

## 2020-07-03 DIAGNOSIS — Z01810 Encounter for preprocedural cardiovascular examination: Secondary | ICD-10-CM | POA: Diagnosis not present

## 2020-07-03 DIAGNOSIS — E782 Mixed hyperlipidemia: Secondary | ICD-10-CM

## 2020-07-03 NOTE — Telephone Encounter (Signed)
   [facility] Pre-operative Risk Assessment    Request for surgical clearance:  1. What type of surgery is being performed? Neck lift   2. When is this surgery scheduled? 07/28/2020   3. What type of clearance is required (medical clearance vs. Pharmacy clearance to hold med vs. Both)? Medical   4. Are there any medications that need to be held prior to surgery and how long? None specified   5. Practice name and name of physician performing surgery? Dr. Preston Fleeting with [facility]   6. What is the office phone number? [phone]   7.   What is the office fax number? [phone]  8.   Anesthesia type (None, local, MAC, general) ? General anesthesia - outpatient office setting   [name] [name] 07/03/2020, 2:31 PM  _________________________________________________________________   (provider comments below)

## 2020-07-03 NOTE — Telephone Encounter (Signed)
Patient scheduled 07/03/20 @ 2pm for EKG

## 2020-07-03 NOTE — Progress Notes (Signed)
   1.) Reason for visit: EKG - pre-operative clearance per surgeons office request  2.) Name of MD requesting visit: Dr. Oneita Kras with [facility]  3.) H&P: dyslipidemia  4.) ROS related to problem: no symptoms, EKG needed for surgery/clearance  5.) Assessment and plan per MD: EKG performed - HR 82, prolonged QT per report - reviewed by DOD Dr. Antoine Poche - will have scanned to chart and input pre-operative clearance request

## 2020-07-03 NOTE — Telephone Encounter (Signed)
Await EKG to be scanned to chart.  Alver Sorrow, NP

## 2020-07-07 ENCOUNTER — Other Ambulatory Visit: Payer: Self-pay

## 2020-07-07 ENCOUNTER — Other Ambulatory Visit: Payer: BC Managed Care – PPO | Admitting: Internal Medicine

## 2020-07-07 DIAGNOSIS — Z01818 Encounter for other preprocedural examination: Secondary | ICD-10-CM

## 2020-07-07 NOTE — Addendum Note (Signed)
Addended by: Gregery Na on: 07/07/2020 09:57 AM   Modules accepted: Orders

## 2020-07-08 LAB — APTT: aPTT: 27 s (ref 23–32)

## 2020-07-08 LAB — PROTIME-INR
INR: 1
Prothrombin Time: 9.8 s (ref 9.0–11.5)

## 2020-07-08 NOTE — Telephone Encounter (Signed)
Faxed labs from 07/07/2020 and 05/22/20, demographics, medication list, and H&P to Dr Betsey Holiday at [facility] [place] office fax [phone] phone [phone]

## 2020-07-08 NOTE — Telephone Encounter (Signed)
[name] Nose, MD  You 2 minutes ago (10:45 AM)     Called patient back on 3/9/202 at 10:45 am - no concerning features on her EKG - ok for surgery.   Dr Rexene Edison

## 2020-07-08 NOTE — Telephone Encounter (Signed)
   Primary Cardiologist: Chrystie Nose, MD  Chart reviewed as part of pre-operative protocol coverage.   Pt is having an elective procedure and we have been asked to review an updated EKG. EKG performed  07/03/20 and appears to be sinus rhythm with HR 82, first degree heart block, and nonspecific ST changes - all stable from prior tracing in 2017. No further workup needed based on this EKG.   I will route this recommendation to the requesting party via Epic fax function and remove from pre-op pool. Please call with questions.  Roe Rutherford Duke, PA 07/08/2020, 8:29 AM

## 2020-07-20 DIAGNOSIS — H02831 Dermatochalasis of right upper eyelid: Secondary | ICD-10-CM | POA: Diagnosis not present

## 2020-07-22 ENCOUNTER — Telehealth: Payer: Self-pay | Admitting: Internal Medicine

## 2020-07-22 DIAGNOSIS — Z789 Other specified health status: Secondary | ICD-10-CM

## 2020-07-22 DIAGNOSIS — E782 Mixed hyperlipidemia: Secondary | ICD-10-CM

## 2020-07-22 NOTE — Telephone Encounter (Signed)
Patient is due for 1 year lipid clinic visit approx May/June 2022 Lab(s) order & reminder sent via MyChart

## 2020-07-22 NOTE — Telephone Encounter (Signed)
   Pt is calling back, she said per Dr. Dutch Quint office they have not receive clearance yet. Please refax clearance to [phone]

## 2020-07-22 NOTE — Telephone Encounter (Signed)
Re faxed labs 07/07/2020,05/22/2020, demographics, medication list and H&P to Dr Betsey Holiday office fax [phone], phone [phone]

## 2020-07-22 NOTE — Telephone Encounter (Signed)
Forwarded to requesting party via Epic fax function 

## 2020-08-07 ENCOUNTER — Other Ambulatory Visit: Payer: Self-pay | Admitting: Internal Medicine

## 2020-08-17 DIAGNOSIS — K6289 Other specified diseases of anus and rectum: Secondary | ICD-10-CM | POA: Diagnosis not present

## 2020-08-24 ENCOUNTER — Other Ambulatory Visit: Payer: Self-pay

## 2020-08-24 ENCOUNTER — Telehealth: Payer: Self-pay | Admitting: Internal Medicine

## 2020-08-24 ENCOUNTER — Ambulatory Visit (INDEPENDENT_AMBULATORY_CARE_PROVIDER_SITE_OTHER): Payer: BC Managed Care – PPO | Admitting: Internal Medicine

## 2020-08-24 ENCOUNTER — Encounter: Payer: Self-pay | Admitting: Internal Medicine

## 2020-08-24 VITALS — BP 110/80 | HR 78 | Temp 99.6°F | Ht 65.0 in | Wt 174.0 lb

## 2020-08-24 DIAGNOSIS — H60501 Unspecified acute noninfective otitis externa, right ear: Secondary | ICD-10-CM

## 2020-08-24 MED ORDER — AZITHROMYCIN 250 MG PO TABS: ORAL_TABLET | ORAL | 0 refills | Status: AC

## 2020-08-24 MED ORDER — FLUCONAZOLE 150 MG PO TABS: 150.0000 mg | ORAL_TABLET | Freq: Once | ORAL | 0 refills | Status: AC

## 2020-08-24 MED ORDER — HYDROCODONE-ACETAMINOPHEN 5-325 MG PO TABS: 1.0000 | ORAL_TABLET | Freq: Four times a day (QID) | ORAL | 0 refills | Status: DC | PRN

## 2020-08-24 NOTE — Telephone Encounter (Signed)
Please work her in

## 2020-08-24 NOTE — Telephone Encounter (Signed)
[name] [name] [phone]  Raynelle Fanning called to say she has right ear pain that started yesterday, no other symptoms, would like for you to look at it. She normally would not call, however she is leaving on Wednesday to go on a plane to go out of town.

## 2020-08-24 NOTE — Telephone Encounter (Signed)
scheduled

## 2020-08-24 NOTE — Patient Instructions (Addendum)
Has appt to see ENT Physician at [facility] at 9:45 am tomorrow morning.

## 2020-08-24 NOTE — Progress Notes (Signed)
   Subjective:    Patient ID: [name] [name], female    DOB: Jan 12, 1961, 60 y.o.   MRN: [id number]  HPI 60 year old Female  With history of HTN and hyperlipidemia seen today for complaint of right ear pain and decreased hearing. Patient had a plastic surgery procedure one month ago involving face and neck lift. Has healing surgical incision behind right ear and debris in right external ear canal. No fever or chills. No sore throat.  She has had 3 Covid vaccines. Is planning to fly to [place] [place] for a class reunion on Wednesday but is concerned about flying with ear pain.    Review of Systems see above     Objective:   Physical Exam VS BP 110/80 pulse 78 T 99.6 degrees pulse ox 93% Weight 174 pounds  Healing surgical scar behind right ear with no evidence of secondary infection.  She has debris seen from observation of external ear canal. Could not visualize drum. Tried to remove with currette and barely touched the ear. She jumped and said it was sore.     Assessment & Plan:  Possible otitis externa right ear and maybe ROM also  S/p Neck lift procedure/ facelift about a month ago by plastic surgeon who unfortunately is out of town and not available to see her.  HTN stable on amlodipine and HCTZ  Hx of migraine headaches treated with prophylactic Topamax  Hypothyroidism- is on thyroid replacement medication  Hyperlipidemia seen at lipid clinic by dr. Rennis Golden and treated with Repatha and Zetia  Plan: Zithromax Z-PAK to take 2 tablets day 1 followed by 1 tab days 2 through 5.  Norco 5/325 (#15) one by mouth every 8 hours as needed with food for ear pain. Diflucan is needed for Candida vaginitis while on antibiotics.   [facility] has graciously agreed to see her regarding her ear at 9:45 am tomorrow. Patient informed today of appointment.

## 2020-08-25 DIAGNOSIS — H938X3 Other specified disorders of ear, bilateral: Secondary | ICD-10-CM | POA: Diagnosis not present

## 2020-08-25 DIAGNOSIS — H9201 Otalgia, right ear: Secondary | ICD-10-CM | POA: Diagnosis not present

## 2020-08-25 DIAGNOSIS — J343 Hypertrophy of nasal turbinates: Secondary | ICD-10-CM | POA: Diagnosis not present

## 2020-09-03 ENCOUNTER — Other Ambulatory Visit: Payer: Self-pay | Admitting: Internal Medicine

## 2020-09-03 DIAGNOSIS — E782 Mixed hyperlipidemia: Secondary | ICD-10-CM

## 2020-10-01 ENCOUNTER — Other Ambulatory Visit: Payer: Self-pay | Admitting: Internal Medicine

## 2020-10-01 DIAGNOSIS — E782 Mixed hyperlipidemia: Secondary | ICD-10-CM

## 2020-10-22 ENCOUNTER — Other Ambulatory Visit: Payer: Self-pay | Admitting: Internal Medicine

## 2020-10-27 ENCOUNTER — Other Ambulatory Visit: Payer: Self-pay

## 2020-10-27 MED ORDER — POTASSIUM CHLORIDE ER 10 MEQ PO TBCR: EXTENDED_RELEASE_TABLET | ORAL | 1 refills | Status: DC

## 2020-10-27 MED ORDER — AMLODIPINE BESYLATE 5 MG PO TABS: 5.0000 mg | ORAL_TABLET | Freq: Every day | ORAL | 1 refills | Status: DC

## 2020-10-30 ENCOUNTER — Telehealth: Payer: Self-pay | Admitting: Internal Medicine

## 2020-10-30 MED ORDER — EZETIMIBE 10 MG PO TABS: 10.0000 mg | ORAL_TABLET | Freq: Every day | ORAL | 0 refills | Status: DC

## 2020-10-30 NOTE — Telephone Encounter (Signed)
Sent in refills for patient's Zetia.

## 2020-10-30 NOTE — Telephone Encounter (Signed)
*  STAT* If patient is at the pharmacy, call can be transferred to refill team.   1. Which medications need to be refilled? (please list name of each medication and dose if known) ezetimibe (ZETIA) 10 MG tablet  2. Which pharmacy/location (including street and city if local pharmacy) is medication to be sent to? [facility] - [place], [place] - [address]  3. Do they need a 30 day or 90 day supply? 90

## 2020-12-09 ENCOUNTER — Other Ambulatory Visit: Payer: Self-pay | Admitting: Internal Medicine

## 2021-01-12 ENCOUNTER — Other Ambulatory Visit: Payer: Self-pay | Admitting: Internal Medicine

## 2021-01-13 ENCOUNTER — Ambulatory Visit (HOSPITAL_BASED_OUTPATIENT_CLINIC_OR_DEPARTMENT_OTHER): Payer: BC Managed Care – PPO | Admitting: Internal Medicine

## 2021-01-17 ENCOUNTER — Telehealth: Payer: BC Managed Care – PPO | Admitting: Internal Medicine

## 2021-01-17 MED ORDER — LEVOTHYROXINE SODIUM 50 MCG PO TABS: ORAL_TABLET | ORAL | 1 refills | Status: DC

## 2021-01-17 MED ORDER — HYDROCHLOROTHIAZIDE 25 MG PO TABS: 25.0000 mg | ORAL_TABLET | Freq: Every day | ORAL | 1 refills | Status: DC

## 2021-01-17 MED ORDER — BUPROPION HCL ER (XL) 150 MG PO TB24: 150.0000 mg | ORAL_TABLET | Freq: Every day | ORAL | 2 refills | Status: DC

## 2021-01-17 NOTE — Telephone Encounter (Signed)
Received fax order request on HCTZ, Wellbutrin and Levothyroxine. Has booked CPE late January. Have refilled x 6 months electronically to mail order pharmacy.

## 2021-02-01 DIAGNOSIS — Z86010 Personal history of colonic polyps: Secondary | ICD-10-CM | POA: Diagnosis not present

## 2021-02-01 DIAGNOSIS — R194 Change in bowel habit: Secondary | ICD-10-CM | POA: Diagnosis not present

## 2021-02-01 DIAGNOSIS — R1032 Left lower quadrant pain: Secondary | ICD-10-CM | POA: Diagnosis not present

## 2021-02-02 DIAGNOSIS — R1032 Left lower quadrant pain: Secondary | ICD-10-CM | POA: Diagnosis not present

## 2021-02-02 DIAGNOSIS — Z86010 Personal history of colonic polyps: Secondary | ICD-10-CM | POA: Diagnosis not present

## 2021-02-02 DIAGNOSIS — R194 Change in bowel habit: Secondary | ICD-10-CM | POA: Diagnosis not present

## 2021-02-22 ENCOUNTER — Encounter: Payer: Self-pay | Admitting: Internal Medicine

## 2021-02-22 DIAGNOSIS — K5730 Diverticulosis of large intestine without perforation or abscess without bleeding: Secondary | ICD-10-CM | POA: Diagnosis not present

## 2021-02-22 DIAGNOSIS — K635 Polyp of colon: Secondary | ICD-10-CM | POA: Diagnosis not present

## 2021-02-22 DIAGNOSIS — K621 Rectal polyp: Secondary | ICD-10-CM | POA: Diagnosis not present

## 2021-02-22 DIAGNOSIS — D128 Benign neoplasm of rectum: Secondary | ICD-10-CM | POA: Diagnosis not present

## 2021-02-22 DIAGNOSIS — K5790 Diverticulosis of intestine, part unspecified, without perforation or abscess without bleeding: Secondary | ICD-10-CM | POA: Diagnosis not present

## 2021-02-22 DIAGNOSIS — D123 Benign neoplasm of transverse colon: Secondary | ICD-10-CM | POA: Diagnosis not present

## 2021-02-22 DIAGNOSIS — D120 Benign neoplasm of cecum: Secondary | ICD-10-CM | POA: Diagnosis not present

## 2021-02-22 LAB — HM COLONOSCOPY

## 2021-03-02 ENCOUNTER — Encounter: Payer: Self-pay | Admitting: Internal Medicine

## 2021-03-26 ENCOUNTER — Encounter: Payer: Self-pay | Admitting: Internal Medicine

## 2021-03-30 NOTE — Telephone Encounter (Signed)
Scheduled video visit 

## 2021-03-31 ENCOUNTER — Other Ambulatory Visit: Payer: Self-pay | Admitting: Internal Medicine

## 2021-03-31 ENCOUNTER — Other Ambulatory Visit: Payer: Self-pay

## 2021-03-31 ENCOUNTER — Telehealth (INDEPENDENT_AMBULATORY_CARE_PROVIDER_SITE_OTHER): Payer: BC Managed Care – PPO | Admitting: Internal Medicine

## 2021-03-31 ENCOUNTER — Encounter: Payer: Self-pay | Admitting: Internal Medicine

## 2021-03-31 VITALS — BP 137/99 | HR 74

## 2021-03-31 DIAGNOSIS — I10 Essential (primary) hypertension: Secondary | ICD-10-CM

## 2021-03-31 MED ORDER — LOSARTAN POTASSIUM 50 MG PO TABS: 50.0000 mg | ORAL_TABLET | Freq: Every day | ORAL | 0 refills | Status: DC

## 2021-03-31 MED ORDER — LOSARTAN POTASSIUM 25 MG PO TABS: 25.0000 mg | ORAL_TABLET | Freq: Every day | ORAL | 0 refills | Status: DC

## 2021-03-31 NOTE — Patient Instructions (Signed)
We will increase losartan from 25 to 50 mg daily.  Continue to monitor blood pressure.  It may take a few days to see an improvement.  Please have basic metabolic panel drawn at [facility] in [place] in approximately 2 weeks.  Continue HCTZ and amlodipine as previously prescribed.

## 2021-03-31 NOTE — Progress Notes (Addendum)
   Subjective:    Patient ID: [name] [name], female    DOB: Sep 22, 1960, 60 y.o.   MRN: [id number]  HPI 60 year old Female seen via interactive audio and video telecommunications due to Coronavirus pandemic.  She is agreeable to visit in this format today.  She is identified using 2 identifiers as [name] [name]. Sunday, a patient in this practice.  Patient is at her home, and I am at my office.  Patient has a longstanding history of hypertension and currently is on amlodipine 5 mg daily and HCTZ 25 mg daily.  Recently her blood pressure has been elevated.  She exercises every day.  Patient reports that blood pressure has been elevated in the mornings for the past 5 months.  An example is that some mornings it is 140/99.  We chatted about ideal blood pressure control.  Would like to aim for 120/80 is ideal.  We discussed various medication options for blood pressure control and.  It really would not be of help to increase HCTZ to 50 mg daily.  25 mg and 50.  We can increase losartan to 25 mg    Review of Systems other than elevated blood pressure readings has no new complaints.     Objective:   Physical Exam  Reports blood pressure to be 137/99 this morning with pulse of 74. Says she has been getting similar readings particularly in the mornings.     Assessment & Plan:   Essential hypertension  Plan: Increase losartan to 50 mg daily.  Continue HCTZ and amlodipine.  It may take 7 to 10 days to see improvement in blood pressure after starting increased dose.  Patient will let me know if not improved on this regimen.  She has appointment for health maintenance exam in late January.  In approximately 2 weeks I would like for her to get a basic metabolic panel at [facility] in [facility] where she is currently residing.  An order has been sent to that facility today.  Time spent with this visit is 20 minutes

## 2021-04-14 ENCOUNTER — Encounter: Payer: Self-pay | Admitting: Internal Medicine

## 2021-04-21 ENCOUNTER — Encounter: Payer: Self-pay | Admitting: Internal Medicine

## 2021-04-21 ENCOUNTER — Telehealth: Payer: Self-pay | Admitting: Internal Medicine

## 2021-04-21 NOTE — Telephone Encounter (Signed)
PA for repatha sureclick submitted via CMM (Key: [id number])

## 2021-04-29 NOTE — Telephone Encounter (Signed)
PA approved - dates unknown at this time

## 2021-05-18 DIAGNOSIS — Z0289 Encounter for other administrative examinations: Secondary | ICD-10-CM

## 2021-05-25 ENCOUNTER — Other Ambulatory Visit: Payer: BC Managed Care – PPO | Admitting: Internal Medicine

## 2021-05-27 ENCOUNTER — Encounter: Payer: BC Managed Care – PPO | Admitting: Internal Medicine

## 2021-06-03 ENCOUNTER — Ambulatory Visit: Payer: BC Managed Care – PPO | Admitting: Neurology

## 2021-07-14 ENCOUNTER — Encounter: Payer: Self-pay | Admitting: Internal Medicine

## 2021-07-14 ENCOUNTER — Other Ambulatory Visit: Payer: Self-pay

## 2021-07-14 ENCOUNTER — Telehealth (INDEPENDENT_AMBULATORY_CARE_PROVIDER_SITE_OTHER): Payer: BC Managed Care – PPO | Admitting: Internal Medicine

## 2021-07-14 VITALS — BP 120/70

## 2021-07-14 DIAGNOSIS — I10 Essential (primary) hypertension: Secondary | ICD-10-CM

## 2021-07-14 DIAGNOSIS — E782 Mixed hyperlipidemia: Secondary | ICD-10-CM | POA: Diagnosis not present

## 2021-07-14 DIAGNOSIS — Z789 Other specified health status: Secondary | ICD-10-CM | POA: Diagnosis not present

## 2021-07-14 NOTE — Patient Instructions (Signed)
Medication Instructions:  ?Your physician recommends that you continue on your current medications as directed. Please refer to the Current Medication list given to you today. ? ?*If you need a refill on your cardiac medications before your next appointment, please call your pharmacy* ? ? ?Lab Work: ?FASTING lipid panel in 1 year  ? ? ?Follow-Up: ?At [facility], you and your health needs are our priority.  As part of our continuing mission to provide you with exceptional heart care, we have created designated Provider Care Teams.  These Care Teams include your primary Cardiologist (physician) and Advanced Practice Providers (APPs -  Physician Assistants and Nurse Practitioners) who all work together to provide you with the care you need, when you need it. ? ?We recommend signing up for the patient portal called "MyChart".  Sign up information is provided on this After Visit Summary.  MyChart is used to connect with patients for Virtual Visits (Telemedicine).  Patients are able to view lab/test results, encounter notes, upcoming appointments, etc.  Non-urgent messages can be sent to your provider as well.   ?To learn more about what you can do with MyChart, go to [email or web address].   ? ?Your next appointment:   ?12 month(s) ? ?The format for your next appointment:   ?In Person or Virtual ? ?Provider:   ?Chrystie Nose, MD { ? ? ?

## 2021-07-14 NOTE — Progress Notes (Signed)
? ?Virtual Visit via Video Note  ? ?This visit type was conducted due to national recommendations for restrictions regarding the COVID-19 Pandemic (e.g. social distancing) in an effort to limit this patient's exposure and mitigate transmission in our community.  Due to her co-morbid illnesses, this patient is at least at moderate risk for complications without adequate follow up.  This format is felt to be most appropriate for this patient at this time.  All issues noted in this document were discussed and addressed.  A limited physical exam was performed with this format.  Please refer to the patient's chart for her consent to telehealth for [facility].  ? ?Evaluation Performed: Telephone visit ? ?Date:  07/14/2021  ? ?ID:  [name] [name], DOB 03/09/1961, MRN [id number] ? ?Patient Location:  ?[address] ?[place] [id number] ? ?Provider location:   ?[address], [address] ?[place] [address] ? ?PCP:  No primary care provider on file.  ?Cardiologist:  Chrystie NoseKenneth C Alya Smaltz, MD ?Electrophysiologist:  None  ? ?Chief Complaint:  No complaints ? ?History of Present Illness:   ? ?[name] EhrlichJulie Paradise [name] is a 61 y.o. female who presents via audio/video conferencing for a telehealth visit today.  [name] [name] was seen today for video follow-up.  Overall she is doing well on Repatha and has no significant side effects, compared to the side effect she had on statin therapy.  Please report she can has continued good control of her cholesterol.  Recent labs showed total cholesterol 160, triglycerides 239, HDL 60 and LDL of 52.  Her LDL 10 months ago was 119 with total cholesterol 220.  Her triglycerides have gone up somewhat accordingly, however given the fact she has no demonstrated coronary calcification, I think there is unfortunately little evidence supporting the addition of medications to lower her triglycerides.  While there is strong evidence for the use of Vascepa, and reducing cardiovascular events, this  is in a patient population with known coronary artery disease on top of statin or PCSK9 inhibitor therapy.  There is little evidence of cardiovascular event reduction or mortality benefit in patients with fibrate's.  Based on that, I am recommending dietary interventions including reduction in alcohol intake and weight loss with more exercise which has been an issue in the past several months per her report due to isolating from the COVID-19 pandemic. ? ?04/04/2019 ? ?[name] [name] returns today for follow-up.  Overall she is doing well.  She says she has lost some weight and continues to eat healthy and exercise regularly.  She has been compliant with her Repatha and ezetimibe.  Spite this, there has been small increase in her cholesterol.  Her total cholesterol is at now 178, triglycerides 285, HDL 58 and LDL of 74. ? ?07/14/2021 ? ?[name] [name] returns today for follow-up.  She continues to do well on combination of Repatha and ezetimibe.  Repeat lipids show total cholesterol 178, triglycerides 200, HDL 62 and LDL 83.  Although slightly higher triglycerides have improved.  Overall very favorable lipid profile.  She is very active and is asymptomatic denying chest pain or worsening shortness of breath.  Of note she had no coronary calcium back in 2017.  We have discussed possibility of repeating that next year. ? ? ?The patient does not have symptoms concerning for COVID-19 infection (fever, chills, cough, or new SHORTNESS OF BREATH).  ? ? ?Prior CV studies:   ?The following studies were reviewed today: ? ?Chart reviewed ? ?PMHx:  ?Past Medical History:  ?  Diagnosis Date  ? Abnormal Pap smear of cervix   ? 74yrs ago  ? Classic migraine 09/18/2013  ? Diplopia 09/18/2013  ? Diverticulitis   ? Generalized headaches   ? Hyperlipidemia   ? Hypertension   ? ? ?Past Surgical History:  ?Procedure Laterality Date  ? ABDOMINAL HYSTERECTOMY    ? BREAST SURGERY    ? reduction  ? CERVICAL CONE BIOPSY    ? 92yrs ago  ? CESAREAN SECTION     ? x 2  ? LAPAROSCOPIC APPENDECTOMY N/A 07/31/2014  ? Procedure: APPENDECTOMY LAPAROSCOPIC;  Surgeon: Ovidio Kin, MD;  Location: [facility];  Service: General;  Laterality: N/A;  ? REDUCTION MAMMAPLASTY    ? tendon surgery on elbow    ? TONSILLECTOMY    ? TUBAL LIGATION    ? ? ?FAMHx:  ?Family History  ?Problem Relation Age of Onset  ? Prostate cancer Father   ? Heart disease Father 28  ?     CABGs x 3  ? Hypertension Father   ? Hyperlipidemia Sister   ? Hypertension Sister   ? Renal cancer Sister   ? Other Sister   ?     bile duct cancer  ? Alcohol abuse Brother   ? Diabetes Brother   ?     PVD  ? Hypertension Brother   ? Cancer Brother   ? Hypertension Brother   ? Hypertension Brother   ? Ovarian cancer Maternal Grandmother   ? ? ?SOCHx:  ? reports that she has never smoked. She has never used smokeless tobacco. She reports current alcohol use. She reports that she does not use drugs. ? ?ALLERGIES:  ?Allergies  ?Allergen Reactions  ? Statins Other (See Comments)  ?  Cognitive impairment - Atorvastatin, Rosuvastatin, Pravastatin, Simvastatin (high and low doses)  ? Other   ?  Anything with venom  ? Ramipril Cough  ? Sulfa Antibiotics Other (See Comments)  ?  Itching.  ? Yellow Jacket Venom [Bee Venom] Hives, Swelling and Rash  ? ? ?MEDS: ? ?Current Meds  ?Medication Sig  ? acetaminophen (TYLENOL) 650 MG CR tablet Take 1 tablet (650 mg total) by mouth every 8 (eight) hours as needed for pain.  ? amLODipine (NORVASC) 5 MG tablet TAKE ONE TABLET BY MOUTH DAILY  ? buPROPion (WELLBUTRIN XL) 150 MG 24 hr tablet Take 1 tablet (150 mg total) by mouth daily.  ? ezetimibe (ZETIA) 10 MG tablet Take 1 tablet (10 mg total) by mouth daily. Please keep upcoming appt in March 2023 with Dr. Rennis Golden before anymore refills. Thank you Final Attempt  ? hydrochlorothiazide (HYDRODIURIL) 25 MG tablet Take 1 tablet (25 mg total) by mouth daily.  ? HYDROcodone-acetaminophen (NORCO) 5-325 MG tablet Take 1 tablet by mouth every 6 (six) hours as  needed for moderate pain.  ? levothyroxine (SYNTHROID) 50 MCG tablet TAKE 1 TABLET BY MOUTH ONE TIME DAILY BEFORE BREAKFAST  ? losartan (COZAAR) 50 MG tablet Take 1 tablet (50 mg total) by mouth daily.  ? potassium chloride (KLOR-CON) 10 MEQ tablet TAKE ONE TABLET BY MOUTH DAILY  ? REPATHA SURECLICK 140 MG/ML SOAJ INJECT 1 PEN (140MG ) SUBCUTANEOUSLY EVERY 14 DAYS  ? topiramate (TOPAMAX) 25 MG tablet TAKE 1 TABLET(25 MG) BY MOUTH DAILY  ?  ? ?ROS: ?Pertinent items noted in HPI and remainder of comprehensive ROS otherwise negative. ? ?Labs/Other Tests and Data Reviewed:   ? ?Recent Labs: ?No results found for requested labs within last 8760 hours.  ? ?Recent  Lipid Panel ?Lab Results  ?Component Value Date/Time  ? CHOL 218 (H) 05/22/2020 11:38 AM  ? CHOL 152 09/16/2019 09:19 AM  ? TRIG 298 (H) 05/22/2020 11:38 AM  ? HDL 64 05/22/2020 11:38 AM  ? HDL 54 09/16/2019 09:19 AM  ? CHOLHDL 3.4 05/22/2020 11:38 AM  ? LDLCALC 114 (H) 05/22/2020 11:38 AM  ? LDLDIRECT 130.7 11/27/2008 11:41 AM  ? ? ?Wt Readings from Last 3 Encounters:  ?08/24/20 174 lb (78.9 kg)  ?06/03/20 174 lb (78.9 kg)  ?09/19/19 171 lb 9.6 oz (77.8 kg)  ?  ? ?Exam:   ? ?Vital Signs:  BP 120/70   ? ?General appearance: alert and no distress ?Lungs: No visual respiratory difficulty ?Abdomen: Normal weight ?Extremities: extremities normal, atraumatic, no cyanosis or edema ?Skin: Skin color, texture, turgor normal. No rashes or lesions ?Neurologic: Grossly normal ?Psych: Pleasant ? ?ASSESSMENT & PLAN:   ? 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 ?Ms. Pelster continues to have significant reduction in her lipids on combination of Repatha and ezetimibe.  LDL has crept up slightly but her triglycerides have improved and overall total cholesterol has not changed.  She is very active and exercises regularly and remains  asymptomatic.  We will consider repeat calcium score next year.  Plan follow-up with me annually or sooner as necessary. ? ?COVID-19 Education: ?The signs and symptoms of COVID-19 were discussed with the patie

## 2021-07-15 ENCOUNTER — Telehealth: Payer: Self-pay | Admitting: Internal Medicine

## 2021-07-15 NOTE — Telephone Encounter (Signed)
[employer] called and said that the patient will require a new PA for the Repatha as the patients insurance changed 05/02/21 ? ?Please fax PA to [phone] ?

## 2021-07-15 NOTE — Telephone Encounter (Signed)
Routed to primary nurse to make aware.   

## 2021-07-19 NOTE — Telephone Encounter (Signed)
PA submitted via CMM ?(Key: [id number]) ?

## 2021-07-22 NOTE — Telephone Encounter (Signed)
PA for repatha denied with new insurance, [employer] ?

## 2021-08-02 NOTE — Telephone Encounter (Signed)
Per patient MyChart message, was given phone # for insurance contact/liaison.  ? ?Rosann Auerbach directly: [phone]. - with [employer]  ? ?Left message for Okey Dupre to call back  ?

## 2021-08-05 ENCOUNTER — Telehealth: Payer: Self-pay | Admitting: Internal Medicine

## 2021-08-05 DIAGNOSIS — E782 Mixed hyperlipidemia: Secondary | ICD-10-CM

## 2021-08-05 NOTE — Telephone Encounter (Signed)
Message relayed to patient via MyChart message

## 2021-08-05 NOTE — Telephone Encounter (Signed)
Spoke with Jaci Carrel on 08/04/21 -- was supposed to receive a fax.  ? ?Called Jaci Carrel today 08/05/21 and advised nothing received. She provided # for Christin Bach [phone] ? ? ?

## 2021-08-05 NOTE — Telephone Encounter (Signed)
Pt c/o medication issue: ? ?1. Name of Medication: REPATHA SURECLICK 140 MG/ML SOAJ ? ?2. How are you currently taking this medication (dosage and times per day)? N/A ? ?3. Are you having a reaction (difficulty breathing--STAT)? N/A ? ?4. What is your medication issue? Jasmine December is calling stating she spoke with [place] today in regards to this medication. Is calling back to inform her [employer] has accepted the approval.  ?

## 2021-08-05 NOTE — Telephone Encounter (Signed)
Spoke with Christin Bach, another account specialist with [employer] and she notified me that patient should have been grandfathered into an approval when drug benefits changed from [employer] to [employer] since [place] was approved 04/2021. She said that [employer] received Jan 2022-Jan 2023 approved from [facility]. Advised will send her a copy of the 04/2021 approval, which authorizes Repatha until 04/2022 so that patient may be able to receive medication.  ?

## 2021-08-06 NOTE — Telephone Encounter (Signed)
Separate phone message received that Repatha is approved -- grandfathered in from [facility] approval from 04/2021 -- valid until 04/2022 ? ?Patient aware via MyChart message ?

## 2021-08-17 MED ORDER — REPATHA SURECLICK 140 MG/ML ~~LOC~~ SOAJ: SUBCUTANEOUS | 3 refills | Status: DC

## 2021-08-17 NOTE — Addendum Note (Signed)
Addended by: Lindell Spar on: 08/17/2021 09:16 AM ? ? Modules accepted: Orders ? ?

## 2021-09-06 ENCOUNTER — Other Ambulatory Visit: Payer: Self-pay | Admitting: Internal Medicine

## 2021-09-06 NOTE — Telephone Encounter (Signed)
Also LVM to confirm, however when I spoke with her in January she canceled her CPE because she was moving everything to [facility]. ?

## 2021-09-06 NOTE — Telephone Encounter (Signed)
Yes, she did. She moved her PCP to [facility] ?

## 2021-11-08 DIAGNOSIS — M722 Plantar fascial fibromatosis: Secondary | ICD-10-CM | POA: Diagnosis not present

## 2021-11-08 DIAGNOSIS — R269 Unspecified abnormalities of gait and mobility: Secondary | ICD-10-CM | POA: Diagnosis not present

## 2021-11-08 DIAGNOSIS — M25571 Pain in right ankle and joints of right foot: Secondary | ICD-10-CM | POA: Diagnosis not present

## 2021-11-23 DIAGNOSIS — R269 Unspecified abnormalities of gait and mobility: Secondary | ICD-10-CM | POA: Diagnosis not present

## 2021-11-23 DIAGNOSIS — M25571 Pain in right ankle and joints of right foot: Secondary | ICD-10-CM | POA: Diagnosis not present

## 2021-11-23 DIAGNOSIS — M722 Plantar fascial fibromatosis: Secondary | ICD-10-CM | POA: Diagnosis not present

## 2021-11-24 DIAGNOSIS — R269 Unspecified abnormalities of gait and mobility: Secondary | ICD-10-CM | POA: Diagnosis not present

## 2021-11-24 DIAGNOSIS — M25571 Pain in right ankle and joints of right foot: Secondary | ICD-10-CM | POA: Diagnosis not present

## 2021-11-24 DIAGNOSIS — M722 Plantar fascial fibromatosis: Secondary | ICD-10-CM | POA: Diagnosis not present

## 2021-11-25 DIAGNOSIS — M25571 Pain in right ankle and joints of right foot: Secondary | ICD-10-CM | POA: Diagnosis not present

## 2021-11-25 DIAGNOSIS — M722 Plantar fascial fibromatosis: Secondary | ICD-10-CM | POA: Diagnosis not present

## 2021-11-25 DIAGNOSIS — R269 Unspecified abnormalities of gait and mobility: Secondary | ICD-10-CM | POA: Diagnosis not present

## 2021-11-26 ENCOUNTER — Telehealth: Payer: Self-pay | Admitting: Internal Medicine

## 2021-11-26 NOTE — Telephone Encounter (Signed)
Patient stated she had a reaction to the last 2 Repatha injections. The next to last injection caused itchiness all over. The last injection caused hives around the injection site. She took 12.5mg  Benedryl with relief. She has been on Repatha 4- 5 years and wonders if the formulation has currently changed. Please advise on allergic reaction to Repatha.

## 2021-11-26 NOTE — Telephone Encounter (Signed)
[name] Nose, MD  You; Melina Modena, RN 8 minutes ago (2:05 PM)    I'm not aware of any changes to Repatha -would be unusual to have an injection site reaction now, if she has not had it in the past several years on the medicine. If this persists, we may need to consider switching to Praluent.   Dr. Rexene Edison

## 2021-11-26 NOTE — Telephone Encounter (Signed)
Patient aware of advice from MD. She reports she has had 2 injections from different lot #s that caused a reaction. She would like to try Praluent.   Advised a prior authorization will be submitted.    PA submitted in CMM (Key: [id number])

## 2021-11-26 NOTE — Telephone Encounter (Signed)
Pt c/o medication issue:  1. Name of Medication: Repatha  2. How are you currently taking this medication (dosage and times per day)?  1 time every 2 weeks  3. Are you having a reaction (difficulty breathing--STAT)?   4. What is your medication issue? Allergic reaction- rash and itching

## 2021-11-29 DIAGNOSIS — M25571 Pain in right ankle and joints of right foot: Secondary | ICD-10-CM | POA: Diagnosis not present

## 2021-11-29 DIAGNOSIS — M722 Plantar fascial fibromatosis: Secondary | ICD-10-CM | POA: Diagnosis not present

## 2021-11-29 DIAGNOSIS — R269 Unspecified abnormalities of gait and mobility: Secondary | ICD-10-CM | POA: Diagnosis not present

## 2021-12-01 DIAGNOSIS — M722 Plantar fascial fibromatosis: Secondary | ICD-10-CM | POA: Diagnosis not present

## 2021-12-01 DIAGNOSIS — M25571 Pain in right ankle and joints of right foot: Secondary | ICD-10-CM | POA: Diagnosis not present

## 2021-12-01 DIAGNOSIS — R269 Unspecified abnormalities of gait and mobility: Secondary | ICD-10-CM | POA: Diagnosis not present

## 2021-12-02 DIAGNOSIS — R269 Unspecified abnormalities of gait and mobility: Secondary | ICD-10-CM | POA: Diagnosis not present

## 2021-12-02 DIAGNOSIS — M722 Plantar fascial fibromatosis: Secondary | ICD-10-CM | POA: Diagnosis not present

## 2021-12-02 DIAGNOSIS — M25571 Pain in right ankle and joints of right foot: Secondary | ICD-10-CM | POA: Diagnosis not present

## 2021-12-05 NOTE — Progress Notes (Signed)
[facility]  PATIENT: [name] [name] DOB: 1961-01-26  REFERRING DOCTOR OR PCP:   SOURCE: Patient, imaging and lab reports.  MRI images personally reviewed.  _________________________________   HISTORICAL  CHIEF COMPLAINT:  Chief Complaint  Patient presents with   New Patient (Initial Visit)    Rm 1, alone. Pt referred to r/o possible MS. Pt having worsening migraines, tremors and tingling. Pt has occular migraines mainly in R eye, w aura. The following day pt reports severe r eye pain. Pt is now having issues w left eye, believes it may be a sty or clogged duct.  Pt had double her topiramate. Taking 50mg  of topiramate. Per pt her big aura migraines have stopped. Currently having dull HA. Pt was on Repatha and had an allergic reaction. Pt broke out in hives, tingling.     HISTORY OF PRESENT ILLNESS:  I had the pleasure of seeing patient, [name] [name], at [facility] for neurologic consultation regarding her headaches and neurologic symptoms.  She is a 61 year old woman who is concerned about MS.   She reports having sensory symptoms that started after a Repatha injection in mid July 2023.  She had a reaction at the injections site her first injection earlier and had hives after the second injection a couple weeks ago.   The next day she had tingling in her feet greater than hands.    She also had headaches and lethargy.  Her eyes were painful.    Her essential tremor worsened.   She denied weakness but felt her balance was a little off and she felt mildly dizzy.    She had some diplopia and visual focus was difficult.  She had trouble concentrating.   The tingling has gradually improved over the last week but she has mild tingling pparesthesia, especially in her feet.   Repatha was working well for her cholesterol and she had been on x 5 years before reactions.    She has had migraine headaches since age 61.  She gets a visual aura.  She often  has nausea but not vomiting.   She has photophobia > phonophobia and moving intensifies the pain.   She usually rests in her bed with temperature down and lights off..   Caffeine can help.    She takes topiramate 25 mg generally but doubled the dose last week. (Years ago, she was on a higher dose).   She still has a dull headache.    Migraine prophylactic medication tried   Topiramete, amlodipine,  venlafaxine Migraine treatment medications:  Cafergot,.  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   Her mom had BET.       Imaging: MRI of the brain 04/09/2009 was normal.  MR angiogram 04/09/2009 showed no stenosis.  Incidental note of a fetal origin of the left posterior cerebral artery, a normal.   REVIEW OF SYSTEMS: Constitutional: No fevers, chills, sweats, or change in appetite Eyes: No visual changes, double vision, eye pain Ear, nose and throat: No hearing loss, ear pain, nasal congestion, sore throat Cardiovascular: No chest pain, palpitations Respiratory:  No shortness of breath at rest or with exertion.   No wheezes GastrointestinaI: No nausea, vomiting, diarrhea, abdominal pain, fecal incontinence Genitourinary:  No dysuria, urinary retention or frequency.  No nocturia. Musculoskeletal:  No neck pain, back pain Integumentary: No rash, pruritus, skin lesions Neurological: as above  Psychiatric: No depression at this time.  No anxiety Endocrine: No palpitations, diaphoresis, change in appetite, change in weigh or increased thirst Hematologic/Lymphatic:  No anemia, purpura, petechiae. Allergic/Immunologic: No itchy/runny eyes, nasal congestion, recent allergic reactions, rashes  ALLERGIES: Allergies  Allergen Reactions   Statins Other (See Comments)    Cognitive impairment - Atorvastatin, Rosuvastatin, Pravastatin, Simvastatin (high and low doses)   Other     Anything with venom    Ramipril Cough   Sulfa Antibiotics Other (See Comments)    Itching.   Yellow Jacket Venom [Bee Venom] Hives, Swelling and Rash    HOME MEDICATIONS:  Current Outpatient Medications:    acetaminophen (TYLENOL) 650 MG CR tablet, Take 1 tablet (650 mg total) by mouth every 8 (eight) hours as needed for pain., Disp: 90 tablet, Rfl: 3   amLODipine (NORVASC) 5 MG tablet, TAKE ONE TABLET BY MOUTH DAILY, Disp: 90 tablet, Rfl: 0   ezetimibe (ZETIA) 10 MG tablet, Take 1 tablet (10 mg total) by mouth daily. Please keep upcoming appt in March 2023 with Dr. Rennis Golden before anymore refills. Thank you Final Attempt, Disp: 90 tablet, Rfl: 0   hydrochlorothiazide (HYDRODIURIL) 25 MG tablet, Take 1 tablet (25 mg total) by mouth daily., Disp: 90 tablet, Rfl: 1   HYDROcodone-acetaminophen (NORCO) 5-325 MG tablet, Take 1 tablet by mouth every 6 (six) hours as needed for moderate pain., Disp: 30 tablet, Rfl: 0   levothyroxine (SYNTHROID) 50 MCG tablet, TAKE 1 TABLET BY MOUTH ONE TIME DAILY BEFORE BREAKFAST, Disp: 90 tablet, Rfl: 1   losartan (COZAAR) 50 MG tablet, Take 1 tablet (50 mg total) by mouth daily., Disp: 90 tablet, Rfl: 0   potassium chloride (KLOR-CON) 10 MEQ tablet, TAKE ONE TABLET BY MOUTH DAILY, Disp: 90 tablet, Rfl: 0   topiramate (TOPAMAX) 25 MG tablet, TAKE 1 TABLET(25 MG) BY MOUTH DAILY, Disp: 90 tablet, Rfl: 3  PAST MEDICAL HISTORY: Past Medical History:  Diagnosis Date   Abnormal Pap smear of cervix    61yrs ago   Classic migraine 09/18/2013   Diplopia 09/18/2013   Diverticulitis    Generalized headaches    Hyperlipidemia    Hypertension     PAST SURGICAL HISTORY: Past Surgical History:  Procedure Laterality Date   ABDOMINAL HYSTERECTOMY     BREAST SURGERY     reduction   CERVICAL CONE BIOPSY     34yrs ago   CESAREAN SECTION     x 2   LAPAROSCOPIC APPENDECTOMY N/A 07/31/2014   Procedure: APPENDECTOMY LAPAROSCOPIC;  Surgeon: Ovidio Kin, MD;  Location: [facility];  Service: General;   Laterality: N/A;   REDUCTION MAMMAPLASTY     tendon surgery on elbow     TONSILLECTOMY     TUBAL LIGATION      FAMILY HISTORY: Family History  Problem Relation Age of Onset   Prostate cancer Father    Heart disease Father 80       CABGs x 3   Hypertension Father    Hyperlipidemia Sister    Hypertension Sister    Renal cancer Sister    Other Sister        bile duct cancer   Alcohol abuse Brother    Diabetes Brother        PVD   Hypertension Brother    Cancer Brother    Hypertension Brother    Hypertension Brother    Ovarian cancer Maternal Grandmother     SOCIAL HISTORY:  Social History   Socioeconomic History  Marital status: Married    Spouse name: Ok Edwards"   Number of children: 2   Years of education: college   Highest education level: Not on file  Occupational History   Occupation: Tree surgeon  Tobacco Use   Smoking status: Never   Smokeless tobacco: Never  Substance and Sexual Activity   Alcohol use: Yes    Comment: 0-3 a day   Drug use: No   Sexual activity: Yes    Birth control/protection: Surgical    Comment: hysterectomy  Other Topics Concern   Not on file  Social History Narrative   Lives at home w/ her husband   Right-handed   Caffeine: daily hot tea   Social Determinants of Health   Financial Resource Strain: Not on file  Food Insecurity: Not on file  Transportation Needs: Not on file  Physical Activity: Not on file  Stress: Not on file  Social Connections: Not on file  Intimate Partner Violence: Not on file     PHYSICAL EXAM  Vitals:   12/07/21 0903  BP: (!) 149/98  Pulse: 69  Weight: 183 lb 8 oz (83.2 kg)  Height: 5\' 5"  (1.651 m)    Body mass index is 30.54 kg/m.  Vision Screening   Right eye Left eye Both eyes  Without correction 20/40 20/50 20/30   With correction     Comments: Last eye exam was possibly last year or year before.      General: The patient is well-developed and well-nourished and in no acute  distress  HEENT:  Head is [place]/AT.  Sclera are anicteric.  Funduscopic examination showed normal optic discs and retinal vessels.  Neck: No carotid bruits are noted.  The neck is nontender.  Cardiovascular: The heart has a regular rate and rhythm with a normal S1 and S2. There were no murmurs, gallops or rubs.    Skin: Extremities are without rash or  edema.  Musculoskeletal:  Back is nontender  Neurologic Exam  Mental status: The patient is alert and oriented x 3 at the time of the examination. The patient has apparent normal recent and remote memory, with an apparently normal attention span and concentration ability.   Speech is normal.  Cranial nerves: Extraocular movements are full. Pupils are equal, round, and reactive to light and accomodation.  Visual fields are full.  Facial symmetry is present. There is good facial sensation to soft touch bilaterally.Facial strength is normal.  Trapezius and sternocleidomastoid strength is normal. No dysarthria is noted.  The tongue is midline, and the patient has symmetric elevation of the soft palate. No obvious hearing deficits are noted.  Motor: She has a tremor in the chin but none in the hands.  Muscle bulk is normal.   Tone is normal. Strength is  5 / 5 in all 4 extremities.   Sensory: Sensory testing is intact to pinprick, soft touch and vibration sensation in the arms and mildly reduced sensation in both legs to touch   Coordination: Cerebellar testing reveals good finger-nose-finger and heel-to-shin bilaterally.  Gait and station: Station is normal.   Gait is normal. Tandem gait is normal. Romberg is negative.   Reflexes: Deep tendon reflexes are symmetric and normal bilaterally.   Plantar responses are flexor.    DIAGNOSTIC DATA (LABS, IMAGING, TESTING) - I reviewed patient records, labs, notes, testing and imaging myself where available.  Lab Results  Component Value Date   WBC 6.0 05/22/2020   HGB 15.3 05/22/2020   HCT 43.4  05/22/2020  MCV 91.8 05/22/2020   PLT 235 05/22/2020      Component Value Date/Time   NA 140 05/22/2020 1138   K 4.2 05/22/2020 1138   CL 103 05/22/2020 1138   CO2 27 05/22/2020 1138   GLUCOSE 107 (H) 05/22/2020 1138   BUN 24 05/22/2020 1138   CREATININE 0.77 05/22/2020 1138   CALCIUM 10.2 05/22/2020 1138   PROT 7.2 05/22/2020 1138   ALBUMIN 4.1 12/12/2016 1006   AST 21 05/22/2020 1138   ALT 26 05/22/2020 1138   ALKPHOS 104 12/12/2016 1006   BILITOT 0.6 05/22/2020 1138   GFRNONAA 85 05/22/2020 1138   GFRAA 98 05/22/2020 1138   Lab Results  Component Value Date   CHOL 218 (H) 05/22/2020   HDL 64 05/22/2020   LDLCALC 114 (H) 05/22/2020   LDLDIRECT 130.7 11/27/2008   TRIG 298 (H) 05/22/2020   CHOLHDL 3.4 05/22/2020   Lab Results  Component Value Date   HGBA1C 5.9 (H) 05/22/2020   Lab Results  Component Value Date   VITAMINB12 386 09/18/2013   Lab Results  Component Value Date   TSH 3.53 05/22/2020       ASSESSMENT AND PLAN  Diplopia - Plan: MR BRAIN W WO CONTRAST  Numbness - Plan: MR BRAIN W WO CONTRAST, MR CERVICAL SPINE W WO CONTRAST  Worsening headaches - Plan: MR BRAIN W WO CONTRAST, MR CERVICAL SPINE W WO CONTRAST  In summary, Ms. Creely is a 61 year old woman with numbness, diplopia and worsening headaches.  The etiology of her episode of diplopia with vertigo and the numbness is uncertain.  We did check an MRI of the brain and cervical spine to determine if there is any evidence of demyelination, stroke or other pathology.  The MRI of the cervical spine will also allow [place] to rule out extrinsic myelopathy.  Based on the results, additional studies may be needed.  Her second problem is migraines.  I will have her increase the Topamax to 50 mg instead of 25 mg.  Additionally, I sent in a prescription for rizatriptan that she can take when one of the migraines occur.  She should call us if the headaches become worse.  I will see her back in about 4 months  or sooner if there are new or worsening neurologic symptoms or based on the results of the studies.  Thank you for asking me to see Ms. Kuroda.  Please let me know if I can be of further assistance with her or other patients in the future.   Ari Bernabei A. Epimenio Foot, MD, [facility] 12/07/2021, 9:15 AM Certified in Neurology, Clinical Neurophysiology, Sleep Medicine and Neuroimaging  [facility] [address], [address] [place], [place] [address] [phone]

## 2021-12-07 ENCOUNTER — Ambulatory Visit: Payer: BC Managed Care – PPO | Admitting: Neurology

## 2021-12-07 ENCOUNTER — Encounter: Payer: Self-pay | Admitting: Neurology

## 2021-12-07 VITALS — BP 149/98 | HR 69 | Ht 65.0 in | Wt 183.5 lb

## 2021-12-07 DIAGNOSIS — R519 Headache, unspecified: Secondary | ICD-10-CM

## 2021-12-07 DIAGNOSIS — H532 Diplopia: Secondary | ICD-10-CM

## 2021-12-07 DIAGNOSIS — R200 Anesthesia of skin: Secondary | ICD-10-CM | POA: Diagnosis not present

## 2021-12-07 MED ORDER — RIZATRIPTAN BENZOATE 10 MG PO TBDP
10.0000 mg | ORAL_TABLET | Freq: Three times a day (TID) | ORAL | 3 refills | Status: AC | PRN
Start: 2021-12-07 — End: ?

## 2021-12-07 MED ORDER — TOPIRAMATE 50 MG PO TABS: ORAL_TABLET | ORAL | 3 refills | Status: DC

## 2021-12-08 ENCOUNTER — Encounter: Payer: Self-pay | Admitting: Neurology

## 2021-12-08 DIAGNOSIS — M25571 Pain in right ankle and joints of right foot: Secondary | ICD-10-CM | POA: Diagnosis not present

## 2021-12-08 DIAGNOSIS — R269 Unspecified abnormalities of gait and mobility: Secondary | ICD-10-CM | POA: Diagnosis not present

## 2021-12-08 DIAGNOSIS — M722 Plantar fascial fibromatosis: Secondary | ICD-10-CM | POA: Diagnosis not present

## 2021-12-09 DIAGNOSIS — R269 Unspecified abnormalities of gait and mobility: Secondary | ICD-10-CM | POA: Diagnosis not present

## 2021-12-09 DIAGNOSIS — M722 Plantar fascial fibromatosis: Secondary | ICD-10-CM | POA: Diagnosis not present

## 2021-12-09 DIAGNOSIS — M25571 Pain in right ankle and joints of right foot: Secondary | ICD-10-CM | POA: Diagnosis not present

## 2021-12-12 ENCOUNTER — Telehealth: Payer: Self-pay | Admitting: Neurology

## 2021-12-12 NOTE — Telephone Encounter (Signed)
[employer] NPR via website sent to GI per MD

## 2021-12-18 ENCOUNTER — Ambulatory Visit
Admission: RE | Admit: 2021-12-18 | Discharge: 2021-12-18 | Disposition: A | Discharge status: Home / Self Care | Payer: BC Managed Care – PPO | Source: Ambulatory Visit | Attending: Neurology | Admitting: Neurology

## 2021-12-18 DIAGNOSIS — R519 Headache, unspecified: Secondary | ICD-10-CM

## 2021-12-18 DIAGNOSIS — R200 Anesthesia of skin: Secondary | ICD-10-CM

## 2021-12-18 DIAGNOSIS — H532 Diplopia: Secondary | ICD-10-CM

## 2021-12-18 MED ORDER — GADOBENATE DIMEGLUMINE 529 MG/ML IV SOLN
17.0000 mL | Freq: Once | INTRAVENOUS | Status: AC | PRN
  Administered 2021-12-18: 17 mL via INTRAVENOUS

## 2021-12-27 ENCOUNTER — Telehealth: Payer: Self-pay | Admitting: Internal Medicine

## 2021-12-27 NOTE — Telephone Encounter (Signed)
Returned call to patient, patient states due to previous severe reaction to Repatha, she does not want to try Praluent.     She is requesting to re-trial a statin.  She reports trying all of the statins in the past but this was over 10 years ago.    Advised would route to MD to review.

## 2021-12-27 NOTE — Telephone Encounter (Signed)
Pt c/o medication issue:  1. Name of Medication: repatha  2. How are you currently taking this medication (dosage and times per day)? Not taking  3. Are you having a reaction (difficulty breathing--STAT)? no  4. What is your medication issue? Patient states she had an allergic reaction to the repatha and would like to try a regular statin again.

## 2021-12-30 NOTE — Telephone Encounter (Signed)
I talked with the patient and she said if Dr. Rennis Golden recommend Wilber Bihari she will give the try, she know the it is a process and is have to get a PA for the insurance, she is willing to sign the documents for start the process.

## 2021-12-31 NOTE — Telephone Encounter (Signed)
Enrolled patient in [employer] portal. She has to sign ehipaa attestation that will be emailed to her.

## 2022-01-05 NOTE — Telephone Encounter (Signed)
Patient states via MyChart message, she would like to wait until 04/2022 visit to discuss Leqvio with [facility] MD

## 2022-01-18 NOTE — Telephone Encounter (Signed)
Thanks .Marland Kitchen Labs reviewed. Will discuss Leqvio with her in December.  Dr Lemmie Evens

## 2022-03-29 DIAGNOSIS — I10 Essential (primary) hypertension: Secondary | ICD-10-CM | POA: Diagnosis not present

## 2022-03-29 DIAGNOSIS — G43909 Migraine, unspecified, not intractable, without status migrainosus: Secondary | ICD-10-CM | POA: Diagnosis not present

## 2022-03-29 DIAGNOSIS — R7301 Impaired fasting glucose: Secondary | ICD-10-CM | POA: Diagnosis not present

## 2022-03-29 DIAGNOSIS — Z0289 Encounter for other administrative examinations: Secondary | ICD-10-CM

## 2022-03-29 DIAGNOSIS — E782 Mixed hyperlipidemia: Secondary | ICD-10-CM | POA: Diagnosis not present

## 2022-04-05 ENCOUNTER — Telehealth: Payer: Self-pay

## 2022-04-05 ENCOUNTER — Ambulatory Visit: Payer: BC Managed Care – PPO | Attending: Cardiology | Admitting: Internal Medicine

## 2022-04-05 ENCOUNTER — Encounter: Payer: Self-pay | Admitting: Internal Medicine

## 2022-04-05 VITALS — BP 119/82 | HR 66

## 2022-04-05 DIAGNOSIS — E785 Hyperlipidemia, unspecified: Secondary | ICD-10-CM | POA: Diagnosis not present

## 2022-04-05 DIAGNOSIS — Z789 Other specified health status: Secondary | ICD-10-CM

## 2022-04-05 NOTE — Progress Notes (Addendum)
Virtual Visit via Video Note   This visit type was conducted due to national recommendations for restrictions regarding the COVID-19 Pandemic (e.g. social distancing) in an effort to limit this patient's exposure and mitigate transmission in our community.  Due to her co-morbid illnesses, this patient is at least at moderate risk for complications without adequate follow up.  This format is felt to be most appropriate for this patient at this time.  All issues noted in this document were discussed and addressed.  A limited physical exam was performed with this format.  Please refer to the patient's chart for her consent to telehealth for [facility].   Evaluation Performed: Telephone visit  Date:  04/05/2022   ID:  [name] [name], DOB July 04, 1960, MRN [id number]  Patient Location:  [address] [place] [address]  Provider location:   [address], [address] [place], [place] [address]  PCP:  Patient, No Pcp Per  Cardiologist:  Chrystie Nose, MD Electrophysiologist:  None   Chief Complaint:  No complaints  History of Present Illness:    [name] [name] is a 61 y.o. female who presents via audio/video conferencing for a telehealth visit today.  [name] [name] was seen today for video follow-up.  Overall she is doing well on Repatha and has no significant side effects, compared to the side effect she had on statin therapy.  Please report she can has continued good control of her cholesterol.  Recent labs showed total cholesterol 160, triglycerides 239, HDL 60 and LDL of 52.  Her LDL 10 months ago was 119 with total cholesterol 220.  Her triglycerides have gone up somewhat accordingly, however given the fact she has no demonstrated coronary calcification, I think there is unfortunately little evidence supporting the addition of medications to lower her triglycerides.  While there is strong evidence for the use of Vascepa, and reducing cardiovascular events, this is in a patient  population with known coronary artery disease on top of statin or PCSK9 inhibitor therapy.  There is little evidence of cardiovascular event reduction or mortality benefit in patients with fibrate's.  Based on that, I am recommending dietary interventions including reduction in alcohol intake and weight loss with more exercise which has been an issue in the past several months per her report due to isolating from the COVID-19 pandemic.  04/04/2019  [name] [name] returns today for follow-up.  Overall she is doing well.  She says she has lost some weight and continues to eat healthy and exercise regularly.  She has been compliant with her Repatha and ezetimibe.  Spite this, there has been small increase in her cholesterol.  Her total cholesterol is at now 178, triglycerides 285, HDL 58 and LDL of 74.  07/14/2021  [name] [name] returns today for follow-up.  She continues to do well on combination of Repatha and ezetimibe.  Repeat lipids show total cholesterol 178, triglycerides 200, HDL 62 and LDL 83.  Although slightly higher triglycerides have improved.  Overall very favorable lipid profile.  She is very active and is asymptomatic denying chest pain or worsening shortness of breath.  Of note she had no coronary calcium back in 2017.  We have discussed possibility of repeating that next year.  04/05/2022  [name] [name] is seen today in follow-up. 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  She reported it  to [employer] who noted that others have reported it as well.  I also wonder if it could be vaccine related- she had had vaccines and COVID 19 as well around that time.  Nonetheless, it would be safest to pursue another option. She had repeat bloodwork in September - her TC was 279, TG 263, HDL 63 and LDL 167.  She is on zetia.  She has failed 4 statins due to cognitive impairment.  Prior  CV studies:   The following studies were reviewed today:  Chart reviewed  PMHx:  Past Medical History:  Diagnosis Date   Abnormal Pap smear of cervix    67yrs ago   Classic migraine 09/18/2013   Diplopia 09/18/2013   Diverticulitis    Generalized headaches    Hyperlipidemia    Hypertension     Past Surgical History:  Procedure Laterality Date   ABDOMINAL HYSTERECTOMY     BREAST SURGERY     reduction   CERVICAL CONE BIOPSY     25yrs ago   CESAREAN SECTION     x 2   LAPAROSCOPIC APPENDECTOMY N/A 07/31/2014   Procedure: APPENDECTOMY LAPAROSCOPIC;  Surgeon: Ovidio Kin, MD;  Location: [facility];  Service: General;  Laterality: N/A;   REDUCTION MAMMAPLASTY     tendon surgery on elbow     TONSILLECTOMY     TUBAL LIGATION      FAMHx:  Family History  Problem Relation Age of Onset   Prostate cancer Father    Heart disease Father 76       CABGs x 3   Hypertension Father    Hyperlipidemia Sister    Hypertension Sister    Renal cancer Sister    Other Sister        bile duct cancer   Alcohol abuse Brother    Diabetes Brother        PVD   Hypertension Brother    Cancer Brother    Hypertension Brother    Hypertension Brother    Ovarian cancer Maternal Grandmother     SOCHx:   reports that she has never smoked. She has never used smokeless tobacco. She reports current alcohol use. She reports that she does not use drugs.  ALLERGIES:  Allergies  Allergen Reactions   Statins Other (See Comments)    Cognitive impairment - Atorvastatin, Rosuvastatin, Pravastatin, Simvastatin (high and low doses)   Other     Anything with venom   Ramipril Cough   Sulfa Antibiotics Other (See Comments)    Itching.   Yellow Jacket Venom [Bee Venom] Hives, Swelling and Rash    MEDS:  No outpatient medications have been marked as taking for the 04/05/22 encounter (Video Visit) with Chrystie Nose, MD.     ROS: Pertinent items noted in HPI and remainder of comprehensive ROS otherwise  negative.  Labs/Other Tests and Data Reviewed:    Recent Labs: No results found for requested labs within last 365 days.   Recent Lipid Panel Lab Results  Component Value Date/Time   CHOL 218 (H) 05/22/2020 11:38 AM   CHOL 152 09/16/2019 09:19 AM   TRIG 298 (H) 05/22/2020 11:38 AM   HDL 64 05/22/2020 11:38 AM   HDL 54 09/16/2019 09:19 AM   CHOLHDL 3.4 05/22/2020 11:38 AM   LDLCALC 114 (H) 05/22/2020 11:38 AM   LDLDIRECT 130.7 11/27/2008 11:41 AM    Wt Readings from Last 3 Encounters:  12/07/21 183 lb 8 oz (83.2 kg)  08/24/20 174 lb (78.9 kg)  06/03/20 174 lb (78.9 kg)     Exam:    Vital Signs:  BP 119/82   Pulse 66    General appearance: alert and no distress Lungs: No visual respiratory difficulty Abdomen: Normal weight Extremities: extremities normal, atraumatic, no cyanosis or edema Skin: Skin color, texture, turgor normal. 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  She had previously tolerated some injections.  She reached out to [employer] who said that this has been reported although I am not previously aware of this.  She apparently had also had COVID around that time and had vaccines which can cause a peripheral neuropathy/Guillain-Barr or CIDP picture.  Nonetheless, I would agree with not pursuing antibody PCSK9 inhibitor therapy.  We discussed Leqvio as an alternative.  With how high her cholesterol is she really needs a potent lipid-lowering agent.  This should also be associated with a low risk of side effects.  She says she would be willing to try it and we will have to get prior authorization. Follow-up with me in 6  months with lipid NMR and LP(a).  COVID-19 Education: The signs and symptoms of COVID-19 were discussed with the patient and how to seek care for testing (follow up with PCP or arrange E-visit).  The importance of social distancing was discussed today.  Patient Risk:   After full review of this patients clinical status, I feel that they are at least moderate risk at this time.  Time:   Today, I have spent 25 minutes with the patient with telehealth technology discussing lipid management, dietary education, risk factor modification.     Medication Adjustments/Labs and Tests Ordered: Current medicines are reviewed at length with the patient today.  Concerns regarding medicines are outlined above.   Tests Ordered: No orders of the defined types were placed in this encounter.   Medication Changes: No orders of the defined types were placed in this encounter.   Disposition:  in 6 month(s)  Chrystie Nose, MD, [facility], FACP  [place]  [facility]  Medical Director of the Advanced Lipid Disorders &  Cardiovascular Risk Reduction Clinic Diplomate of the American Board of Clinical Lipidology Attending Cardiologist  Direct Dial: [phone]  Fax: [phone]  Website:  [email or web address]  Chrystie Nose, MD  04/05/2022 8:15 AM

## 2022-04-05 NOTE — Telephone Encounter (Addendum)
Left voice message for patient to review her MyChart for her AVS for today's virtual appointment with Dr. Rennis Golden and to give our office a call if she has any questions. AVS mailed to patient.

## 2022-04-05 NOTE — Patient Instructions (Addendum)
Medication Instructions:   Dr. Blanchie Dessert nurse will contact you about Leqvio  *If you need a refill on your cardiac medications before your next appointment, please call your pharmacy*  Lab Work: Your physician recommends that you return for lab work in 6 months 2-3 days prior to follow up appointment:  LP(a) NMR  If you have labs (blood work) drawn today and your tests are completely normal, you will receive your results only by: MyChart Message (if you have MyChart) OR A paper copy in the mail If you have any lab test that is abnormal or we need to change your treatment, we will call you to review the results.  Testing/Procedures: NONE ordered at this time of appointment   Follow-Up: At [facility], you and your health needs are our priority.  As part of our continuing mission to provide you with exceptional heart care, we have created designated Provider Care Teams.  These Care Teams include your primary Cardiologist (physician) and Advanced Practice Providers (APPs -  Physician Assistants and Nurse Practitioners) who all work together to provide you with the care you need, when you need it.  Your next appointment:   6 month(s)  The format for your next appointment:   In Person  Provider:   Dr. Rennis Golden [facility]      Other Instructions  Important Information About Sugar

## 2022-04-05 NOTE — Telephone Encounter (Signed)
  Patient Consent for Virtual Visit        [name] [name] has provided verbal consent on 04/05/2022 for a virtual visit (video or telephone).   CONSENT FOR VIRTUAL VISIT FOR:  [name] [name]  By participating in this virtual visit I agree to the following:  I hereby voluntarily request, consent and authorize [facility] and its employed or contracted physicians, physician assistants, nurse practitioners or other licensed health care professionals (the Practitioner), to provide me with telemedicine health care services (the "Services") as deemed necessary by the treating Practitioner. I acknowledge and consent to receive the Services by the Practitioner via telemedicine. I understand that the telemedicine visit will involve communicating with the Practitioner through live audiovisual communication technology and the disclosure of certain medical information by electronic transmission. I acknowledge that I have been given the opportunity to request an in-person assessment or other available alternative prior to the telemedicine visit and am voluntarily participating in the telemedicine visit.  I understand that I have the right to withhold or withdraw my consent to the use of telemedicine in the course of my care at any time, without affecting my right to future care or treatment, and that the Practitioner or I may terminate the telemedicine visit at any time. I understand that I have the right to inspect all information obtained and/or recorded in the course of the telemedicine visit and may receive copies of available information for a reasonable fee.  I understand that some of the potential risks of receiving the Services via telemedicine include:  Delay or interruption in medical evaluation due to technological equipment failure or disruption; Information transmitted may not be sufficient (e.g. poor resolution of images) to allow for appropriate medical decision making by the  Practitioner; and/or  In rare instances, security protocols could fail, causing a breach of personal health information.  Furthermore, I acknowledge that it is my responsibility to provide information about my medical history, conditions and care that is complete and accurate to the best of my ability. I acknowledge that Practitioner's advice, recommendations, and/or decision may be based on factors not within their control, such as incomplete or inaccurate data provided by me or distortions of diagnostic images or specimens that may result from electronic transmissions. I understand that the practice of medicine is not an exact science and that Practitioner makes no warranties or guarantees regarding treatment outcomes. I acknowledge that a copy of this consent can be made available to me via my patient portal [facility] MyChart), or I can request a printed copy by calling the office of [facility].    I understand that my insurance will be billed for this visit.   I have read or had this consent read to me. I understand the contents of this consent, which adequately explains the benefits and risks of the Services being provided via telemedicine.  I have been provided ample opportunity to ask questions regarding this consent and the Services and have had my questions answered to my satisfaction. I give my informed consent for the services to be provided through the use of telemedicine in my medical care

## 2022-04-07 ENCOUNTER — Telehealth: Payer: Self-pay | Admitting: Internal Medicine

## 2022-04-07 DIAGNOSIS — E782 Mixed hyperlipidemia: Secondary | ICD-10-CM

## 2022-04-07 NOTE — Telephone Encounter (Signed)
Patient saw Dr. Rennis Golden on 04/05/22 for a video visit She would like to check out her benefits for Leqvio  Enrolled in service center portal  Patient will need to sign HIPAA attestation Sent her a MyChart message

## 2022-04-20 ENCOUNTER — Encounter: Payer: Self-pay | Admitting: Internal Medicine

## 2022-04-20 NOTE — Addendum Note (Signed)
Addended by: Lindell Spar on: 04/20/2022 12:38 PM   Modules accepted: Orders

## 2022-04-20 NOTE — Telephone Encounter (Signed)
Patient has been identified as candidate for Leqvio  Benefits investigation enrollment completed on 04/15/22  Benefits investigation report notes the following:  Type of insurance: [employer] [place]  OOP Max: $4000 / Met: $1913.89  Deductible: $3000  Co-insurance: 10%  PA required: Yes  PA phone number: not provided  Benefits Summary Details:   Patient has [employer] [place] PPO self-funded commerical plan. Coverage subject to $3000 calendar year deductible. Once met, Leqvio is covered at 90% with patient responsibility of 10% coinsurance until reaching $4000 OOP. Once OOP max is emt, Leqvio is covered at 100%. Prior Auth is needed.   

## 2022-04-20 NOTE — Addendum Note (Signed)
Addended by: Lindell Spar on: 04/20/2022 03:06 PM   Modules accepted: Orders

## 2022-04-27 ENCOUNTER — Telehealth: Payer: Self-pay | Admitting: Pharmacy Technician

## 2022-04-27 NOTE — Telephone Encounter (Addendum)
Auth Submission: APPROVED Payer: [employer] BLUE CROSS Medication & CPT/J Code(s) submitted: Leqvio (Inclisiran) [id number] Route of submission (phone, fax, portal):  Phone # [phone] Fax # [phone] Auth type: Buy/Bill Units/visits requested: X2 DOSES Reference number: [id number] Approval from:  to  at [facility]   Patient will be scheduled as soon as possible.  CO-PAY CARD: APPROVED ID: [id number]

## 2022-05-09 ENCOUNTER — Encounter: Payer: Self-pay | Admitting: Internal Medicine

## 2022-05-09 NOTE — Telephone Encounter (Signed)
First leqvio injection 05/11/22

## 2022-05-11 ENCOUNTER — Ambulatory Visit (INDEPENDENT_AMBULATORY_CARE_PROVIDER_SITE_OTHER): Payer: BC Managed Care – PPO | Admitting: *Deleted

## 2022-05-11 VITALS — BP 118/82 | HR 69 | Temp 97.5°F | Resp 16 | Ht 64.0 in | Wt 180.4 lb

## 2022-05-11 DIAGNOSIS — E782 Mixed hyperlipidemia: Secondary | ICD-10-CM

## 2022-05-11 MED ORDER — INCLISIRAN SODIUM 284 MG/1.5ML ~~LOC~~ SOSY
284.0000 mg | PREFILLED_SYRINGE | Freq: Once | SUBCUTANEOUS | Status: AC
  Administered 2022-05-11: 284 mg via SUBCUTANEOUS
  Filled 2022-05-11: qty 1.5

## 2022-05-11 NOTE — Progress Notes (Signed)
Diagnosis: Hyperlipidemia  Provider:  Praveen Mannam MD  Procedure: Injection  Leqvio (inclisiran), Dose: 284 mg, Site: subcutaneous, Number of injections: 1  Post Care: Observation period completed  Discharge: Condition: Good, Destination: Home . AVS provided to patient.   Performed by:  Nikhil Osei A, RN       

## 2022-05-18 ENCOUNTER — Other Ambulatory Visit: Payer: Self-pay | Admitting: *Deleted

## 2022-05-18 DIAGNOSIS — E785 Hyperlipidemia, unspecified: Secondary | ICD-10-CM

## 2022-05-22 ENCOUNTER — Encounter: Payer: Self-pay | Admitting: Internal Medicine

## 2022-06-01 NOTE — Telephone Encounter (Signed)
Tresa Res, [place]  You; Wynetta Fines, CPhT; Jonelle Sidle E, RN10 minutes ago (10:20 AM)    We do have the charge removed. Unfortunately, her payor policy will not cover this going forward. Will need to explore an alternative therapy.  She will only be due $59 for the administration charge as her copay. She should see this reflect on her next statement.  Orpah Cobb.

## 2022-06-02 ENCOUNTER — Ambulatory Visit (HOSPITAL_BASED_OUTPATIENT_CLINIC_OR_DEPARTMENT_OTHER)
Admission: RE | Admit: 2022-06-02 | Discharge: 2022-06-02 | Disposition: A | Discharge status: Home / Self Care | Payer: BC Managed Care – PPO | Source: Ambulatory Visit | Attending: Internal Medicine | Admitting: Internal Medicine

## 2022-06-02 DIAGNOSIS — E782 Mixed hyperlipidemia: Secondary | ICD-10-CM | POA: Insufficient documentation

## 2022-06-06 ENCOUNTER — Telehealth: Payer: Self-pay | Admitting: Internal Medicine

## 2022-06-06 ENCOUNTER — Other Ambulatory Visit: Payer: Self-pay | Admitting: *Deleted

## 2022-06-06 DIAGNOSIS — I77819 Aortic ectasia, unspecified site: Secondary | ICD-10-CM

## 2022-06-06 DIAGNOSIS — E7801 Familial hypercholesterolemia: Secondary | ICD-10-CM

## 2022-06-06 NOTE — Telephone Encounter (Signed)
Tamisha from [employer] - 3rd party of [employer] calling to speak with you.   Reference number [id number]

## 2022-06-08 ENCOUNTER — Encounter: Payer: Self-pay | Admitting: Internal Medicine

## 2022-06-08 DIAGNOSIS — E7801 Familial hypercholesterolemia: Secondary | ICD-10-CM | POA: Insufficient documentation

## 2022-06-09 ENCOUNTER — Telehealth: Payer: Self-pay | Admitting: Pharmacy Technician

## 2022-06-09 NOTE — Telephone Encounter (Addendum)
 Jeani Hawking note:  Auth Submission: APPROVED PA resubmitted with updated DX: E78.01 Payer: [employer] Medication & CPT/J Code(s) submitted: Leqvio (Inclisiran) [phone] Route of submission (phone, fax, portal):  Phone # [phone] Fax # [phone] Auth type: Buy/Bill Units/visits requested: 3 Reference number: [id number] Approval from: 06/09/22 - 12/06/22    Co-pay card: Approved Id: [id number] Bin: [id number] Gr: [id number] Pcn: OHCP  Patient will be scheduled as soon as possible

## 2022-06-10 ENCOUNTER — Telehealth: Payer: Self-pay | Admitting: Pharmacy Technician

## 2022-06-10 NOTE — Telephone Encounter (Signed)
ERROR

## 2022-06-14 ENCOUNTER — Encounter: Payer: Self-pay | Admitting: Neurology

## 2022-06-14 ENCOUNTER — Ambulatory Visit (INDEPENDENT_AMBULATORY_CARE_PROVIDER_SITE_OTHER): Payer: BC Managed Care – PPO | Admitting: Neurology

## 2022-06-14 VITALS — BP 130/86 | HR 67 | Ht 65.0 in | Wt 180.5 lb

## 2022-06-14 DIAGNOSIS — G250 Essential tremor: Secondary | ICD-10-CM | POA: Diagnosis not present

## 2022-06-14 DIAGNOSIS — R200 Anesthesia of skin: Secondary | ICD-10-CM

## 2022-06-14 DIAGNOSIS — R202 Paresthesia of skin: Secondary | ICD-10-CM | POA: Diagnosis not present

## 2022-06-14 DIAGNOSIS — G43109 Migraine with aura, not intractable, without status migrainosus: Secondary | ICD-10-CM

## 2022-06-14 MED ORDER — TOPIRAMATE 50 MG PO TABS: 50.0000 mg | ORAL_TABLET | Freq: Two times a day (BID) | ORAL | 4 refills | Status: DC

## 2022-06-14 NOTE — Progress Notes (Signed)
[facility]  PATIENT: [name] [name] DOB: 06-Jan-1961  REFERRING DOCTOR OR PCP:   SOURCE: Patient, imaging and lab reports.  MRI images personally reviewed.  _________________________________   HISTORICAL  CHIEF COMPLAINT:  Chief Complaint  Patient presents with   Follow-up    Pt in room 10, here for migraines. Pt reports doing well. Pt reports tingling in hands and toes, comes and goes.     HISTORY OF PRESENT ILLNESS:  [name] [name] is a 62 y.o. woman with headaches and neurologic symptoms.  Update 06/14/2022 She is doing well.    Migraines are much better on TPM - maybe once a month the last few months, mostly triggered by bright lights like the sun.  When the migraine occurs they are right sided.    The diplopia has resolved.   Vision is fine.   She denies neck pain.   Gait and balance are doig well.   She still has some finger and toe tingling that started when she was on Repatha.   The paresthesias are not painful.  Migraine Hx: She has had migraine headaches since age 7.  She gets a visual aura.  She often has nausea but not vomiting.   She has photophobia > phonophobia and moving intensifies the pain.   She usually rests in her bed with temperature down and lights off..   Caffeine can help.    She takes topiramate 25 mg generally but doubled the dose last week. (Years ago, she was on a higher dose).   She still has a dull headache.    Migraine prophylactic medication tried   Topiramete, amlodipine,  venlafaxine Migraine treatment medications:  Cafergot,.  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   Her mom had BET.      Imaging: MRI of the brai 12/19/2022 showed Several small T2/FLAIR hyperintense foci in the subcortical and deep white matter of the frontal and parietal lobes. This is a nonspecific finding and most likely represents either mild chronic  microvascular ischemic changes or the sequela of migraine headaches. Demyelination would be less likely. None of these appear to be acute. They do not enhance.   MRI of the cervical spine 12/18/2021 showed The spinal cord appears normal   Fairly mild degenerative changes at C4-C5, C5-C6 and C6-C7 do not lead to spinal stenosis or nerve root compression.  MRI of the brain 04/09/2009 was normal.  MR angiogram 04/09/2009 showed no stenosis.  Incidental note of a fetal origin of the left posterior cerebral artery, a normal.   REVIEW OF SYSTEMS: Constitutional: No fevers, chills, sweats, or change in appetite Eyes: No visual changes, double vision, eye pain Ear, nose and throat: No hearing loss, ear pain, nasal congestion, sore throat Cardiovascular: No chest pain, palpitations Respiratory:  No shortness of breath at rest or with exertion.   No wheezes GastrointestinaI: No nausea, vomiting, diarrhea, abdominal pain, fecal incontinence Genitourinary:  No dysuria, urinary retention or frequency.  No nocturia. Musculoskeletal:  No neck pain, back pain Integumentary: No rash, pruritus, skin lesions Neurological: as above Psychiatric: No depression at this time.  No anxiety Endocrine: No palpitations, diaphoresis, change in appetite, change in weigh or increased thirst Hematologic/Lymphatic:  No anemia, purpura, petechiae. Allergic/Immunologic: No itchy/runny eyes, nasal congestion, recent allergic reactions, rashes  ALLERGIES: Allergies  Allergen Reactions   Statins Other (See Comments)    Cognitive impairment - Atorvastatin,  Rosuvastatin, Pravastatin, Simvastatin (high and low doses)   Other     Anything with venom   Ramipril Cough   Sulfa Antibiotics Other (See Comments)    Itching.   Yellow Jacket Venom [Bee Venom] Hives, Swelling and Rash    HOME MEDICATIONS:  Current Outpatient Medications:    acetaminophen (TYLENOL) 650 MG CR tablet, Take 1 tablet (650 mg total) by mouth every 8  (eight) hours as needed for pain., Disp: 90 tablet, Rfl: 3   amLODipine (NORVASC) 5 MG tablet, TAKE ONE TABLET BY MOUTH DAILY, Disp: 90 tablet, Rfl: 0   ezetimibe (ZETIA) 10 MG tablet, Take 1 tablet (10 mg total) by mouth daily. Please keep upcoming appt in March 2023 with Dr. Debara Pickett before anymore refills. Thank you Final Attempt, Disp: 90 tablet, Rfl: 0   hydrochlorothiazide (HYDRODIURIL) 25 MG tablet, Take 1 tablet (25 mg total) by mouth daily., Disp: 90 tablet, Rfl: 1   HYDROcodone-acetaminophen (NORCO) 5-325 MG tablet, Take 1 tablet by mouth every 6 (six) hours as needed for moderate pain., Disp: 30 tablet, Rfl: 0   levothyroxine (SYNTHROID) 50 MCG tablet, TAKE 1 TABLET BY MOUTH ONE TIME DAILY BEFORE BREAKFAST, Disp: 90 tablet, Rfl: 1   losartan (COZAAR) 50 MG tablet, Take 1 tablet (50 mg total) by mouth daily., Disp: 90 tablet, Rfl: 0   potassium chloride (KLOR-CON) 10 MEQ tablet, TAKE ONE TABLET BY MOUTH DAILY, Disp: 90 tablet, Rfl: 0   rizatriptan (MAXALT-MLT) 10 MG disintegrating tablet, Take 1 tablet (10 mg total) by mouth 3 (three) times daily as needed for migraine., Disp: 27 tablet, Rfl: 3   topiramate (TOPAMAX) 50 MG tablet, Take 1 tablet (50 mg total) by mouth 2 (two) times daily. TAKE 1 TABLET (50 MG) BY MOUTH DAILY, Disp: 180 tablet, Rfl: 4  PAST MEDICAL HISTORY: Past Medical History:  Diagnosis Date   Abnormal Pap smear of cervix    32yr ago   Classic migraine 09/18/2013   Diplopia 09/18/2013   Diverticulitis    Generalized headaches    Hyperlipidemia    Hypertension     PAST SURGICAL HISTORY: Past Surgical History:  Procedure Laterality Date   ABDOMINAL HYSTERECTOMY     BREAST SURGERY     reduction   CERVICAL CONE BIOPSY     397yrago   CESAREAN SECTION     x 2   LAPAROSCOPIC APPENDECTOMY N/A 07/31/2014   Procedure: APPENDECTOMY LAPAROSCOPIC;  Surgeon: DaAlphonsa OverallMD;  Location: [facility];  Service: General;  Laterality: N/A;   REDUCTION MAMMAPLASTY     tendon  surgery on elbow     TONSILLECTOMY     TUBAL LIGATION      FAMILY HISTORY: Family History  Problem Relation Age of Onset   Prostate cancer Father    Heart disease Father [id number]     CABGs x 3   Hypertension Father    Hyperlipidemia Sister    Hypertension Sister    Renal cancer Sister    Other Sister        bile duct cancer   Alcohol abuse Brother    Diabetes Brother        PVD   Hypertension Brother    Cancer Brother    Hypertension Brother    Hypertension Brother    Ovarian cancer Maternal Grandmother     SOCIAL HISTORY:  Social History   Socioeconomic History   Marital status: Married    Spouse name: DaTrina Ao  Number of  children: 2   Years of education: college   Highest education level: Not on file  Occupational History   Occupation: artist  Tobacco Use   Smoking status: Never   Smokeless tobacco: Never  Substance and Sexual Activity   Alcohol use: Yes    Comment: 0-3 a day   Drug use: No   Sexual activity: Yes    Birth control/protection: Surgical    Comment: hysterectomy  Other Topics Concern   Not on file  Social History Narrative   Lives at home w/ her husband   Right-handed   Caffeine: daily hot tea   Social Determinants of Health   Financial Resource Strain: Not on file  Food Insecurity: Not on file  Transportation Needs: Not on file  Physical Activity: Not on file  Stress: Not on file  Social Connections: Not on file  Intimate Partner Violence: Not on file     PHYSICAL EXAM  Vitals:   06/14/22 0853  BP: 130/86  Pulse: 67  Weight: 180 lb 8 oz (81.9 kg)  Height: 5' 5"$  (1.651 m)    Body mass index is 30.04 kg/m.  No results found.    General: The patient is well-developed and well-nourished and in no acute distress  HEENT:  Head is [place]/AT.  Sclera are anicteric.  Funduscopic examination showed normal optic discs and retinal vessels.  Neck: No carotid bruits are noted.  The neck is nontender.  Cardiovascular: The heart  has a regular rate and rhythm with a normal S1 and S2. There were no murmurs, gallops or rubs.    Skin: Extremities are without rash or  edema.  Musculoskeletal:  Back is nontender  Neurologic Exam  Mental status: The patient is alert and oriented x 3 at the time of the examination. The patient has apparent normal recent and remote memory, with [name] apparently normal attention span and concentration ability.   Speech is normal.  Cranial nerves: Extraocular movements are full.  Facial Strength was normal.  No obvious hearing deficits are noted.  Motor: She has a tremor in the chin but none in the hands.  Muscle bulk is normal.   Tone is normal. No bradykinesia.  Strength is  5 / 5 in all 4 extremities.   Sensory: Sensory testing is intact to pinprick, soft touch and vibration sensation in the arms and mildly reduced sensation in both feet to touch   Coordination: Cerebellar testing reveals good finger-nose-finger and heel-to-shin bilaterally.  Gait and station: Station is normal.   Gait is normal. Tandem gait is normal. Turns in one step.   No retropulsion. Romberg is negative.   Reflexes: Deep tendon reflexes are symmetric and normal bilaterally.   Plantar responses are flexor.    DIAGNOSTIC DATA (LABS, IMAGING, TESTING) - I reviewed patient records, labs, notes, testing and imaging myself where available.  Lab Results  Component Value Date   WBC 6.0 05/22/2020   HGB 15.3 05/22/2020   HCT 43.4 05/22/2020   MCV 91.8 05/22/2020   PLT 235 05/22/2020      Component Value Date/Time   NA 140 05/22/2020 1138   K 4.2 05/22/2020 1138   CL 103 05/22/2020 1138   CO2 27 05/22/2020 1138   GLUCOSE 107 (H) 05/22/2020 1138   BUN 24 05/22/2020 1138   CREATININE 0.77 05/22/2020 1138   CALCIUM 10.2 05/22/2020 1138   PROT 7.2 05/22/2020 1138   ALBUMIN 4.1 12/12/2016 1006   AST 21 05/22/2020 1138   ALT 26 05/22/2020  1138   ALKPHOS 104 12/12/2016 1006   BILITOT 0.6 05/22/2020 1138    GFRNONAA 85 05/22/2020 1138   GFRAA 98 05/22/2020 1138   Lab Results  Component Value Date   CHOL 218 (H) 05/22/2020   HDL 64 05/22/2020   LDLCALC 114 (H) 05/22/2020   LDLDIRECT 130.7 11/27/2008   TRIG 298 (H) 05/22/2020   CHOLHDL 3.4 05/22/2020   Lab Results  Component Value Date   HGBA1C 5.9 (H) 05/22/2020   Lab Results  Component Value Date   VITAMINB12 386 09/18/2013   Lab Results  Component Value Date   TSH 3.53 05/22/2020       ASSESSMENT AND PLAN  Migraine with aura and without status migrainosus, not intractable  Essential tremor  Numbness and tingling   Continue TPM 50 mg po bid and prn Maxalt She has paresthesias but sensation was normal.  If this worsens check B12, SPEP/IEF; SSA/SSB. Stay active and exercise as tolerated. Return in 1 year or sooner if there are new or worsening symptoms.  [name] [name] A. Felecia Shelling, MD, [facility] [id number], 0000000 AM Certified in Neurology, Clinical Neurophysiology, Sleep Medicine and Neuroimaging  [facility] [address], [place], [place] [address] [phone]

## 2022-06-17 NOTE — Telephone Encounter (Signed)
[name] [name] and spoke with patient in regards to [facility] approval.  [name] [name] has been approved on 06/09/22 - 12/06/22 under (Medical Benefit)  It seems patient was calling the Pharmacy Benefit (not Medical) and was given incorrect information.  Patient also asked about bill/co-pay card. I have emailed [employer] in regards to applying co-pay card to her bill. Thanks

## 2022-06-23 ENCOUNTER — Telehealth: Payer: Self-pay | Admitting: Internal Medicine

## 2022-06-23 NOTE — Telephone Encounter (Signed)
Faxed form to [employer] at [phone] for free dose(s) for patient

## 2022-06-28 DIAGNOSIS — R7301 Impaired fasting glucose: Secondary | ICD-10-CM | POA: Diagnosis not present

## 2022-06-28 DIAGNOSIS — Z0000 Encounter for general adult medical examination without abnormal findings: Secondary | ICD-10-CM | POA: Diagnosis not present

## 2022-06-28 DIAGNOSIS — E039 Hypothyroidism, unspecified: Secondary | ICD-10-CM | POA: Diagnosis not present

## 2022-06-28 DIAGNOSIS — E782 Mixed hyperlipidemia: Secondary | ICD-10-CM | POA: Diagnosis not present

## 2022-06-28 DIAGNOSIS — I10 Essential (primary) hypertension: Secondary | ICD-10-CM | POA: Diagnosis not present

## 2022-06-29 NOTE — Telephone Encounter (Signed)
Pharmacy for [facility] bridge program called about shipment. I gave them the # to [facility] [phone] to speak with [name] [name] or [name] [name]

## 2022-07-13 ENCOUNTER — Other Ambulatory Visit: Payer: Self-pay | Admitting: *Deleted

## 2022-07-13 MED ORDER — TOPIRAMATE 50 MG PO TABS: 50.0000 mg | ORAL_TABLET | Freq: Two times a day (BID) | ORAL | 4 refills | Status: DC

## 2022-07-15 ENCOUNTER — Encounter: Payer: Self-pay | Admitting: Internal Medicine

## 2022-07-18 ENCOUNTER — Other Ambulatory Visit: Payer: Self-pay | Admitting: Internal Medicine

## 2022-07-18 ENCOUNTER — Ambulatory Visit
Admission: RE | Admit: 2022-07-18 | Discharge: 2022-07-18 | Disposition: A | Discharge status: Home / Self Care | Payer: BC Managed Care – PPO | Source: Ambulatory Visit | Attending: Internal Medicine | Admitting: Internal Medicine

## 2022-07-18 ENCOUNTER — Ambulatory Visit: Payer: BC Managed Care – PPO

## 2022-07-18 DIAGNOSIS — Z1231 Encounter for screening mammogram for malignant neoplasm of breast: Secondary | ICD-10-CM | POA: Diagnosis not present

## 2022-07-18 DIAGNOSIS — Z139 Encounter for screening, unspecified: Secondary | ICD-10-CM

## 2022-08-10 DIAGNOSIS — K5792 Diverticulitis of intestine, part unspecified, without perforation or abscess without bleeding: Secondary | ICD-10-CM | POA: Diagnosis not present

## 2022-08-10 DIAGNOSIS — R1032 Left lower quadrant pain: Secondary | ICD-10-CM | POA: Diagnosis not present

## 2022-08-10 DIAGNOSIS — E039 Hypothyroidism, unspecified: Secondary | ICD-10-CM | POA: Diagnosis not present

## 2022-08-10 DIAGNOSIS — E782 Mixed hyperlipidemia: Secondary | ICD-10-CM | POA: Diagnosis not present

## 2022-08-11 ENCOUNTER — Ambulatory Visit: Payer: BC Managed Care – PPO

## 2022-08-11 ENCOUNTER — Ambulatory Visit (INDEPENDENT_AMBULATORY_CARE_PROVIDER_SITE_OTHER): Payer: BC Managed Care – PPO

## 2022-08-11 VITALS — BP 101/71 | HR 82 | Temp 97.5°F | Resp 18 | Ht 65.0 in | Wt 174.6 lb

## 2022-08-11 DIAGNOSIS — E7801 Familial hypercholesterolemia: Secondary | ICD-10-CM | POA: Diagnosis not present

## 2022-08-11 DIAGNOSIS — E782 Mixed hyperlipidemia: Secondary | ICD-10-CM

## 2022-08-11 MED ORDER — INCLISIRAN SODIUM 284 MG/1.5ML ~~LOC~~ SOSY
284.0000 mg | PREFILLED_SYRINGE | Freq: Once | SUBCUTANEOUS | Status: AC
  Administered 2022-08-11: 284 mg via SUBCUTANEOUS
  Filled 2022-08-11: qty 1.5

## 2022-08-11 NOTE — Progress Notes (Addendum)
Diagnosis: Hyperlipidemia  Provider:  Chilton Greathouse MD  Procedure: Injection  Leqvio (inclisiran), Dose: 284 mg, Site: subcutaneous, Number of injections: 1  Post Care: Patient declined observation  Discharge: Condition: Good, Destination: Home . AVS Declined  Performed by:  Adriana Mccallum, RN

## 2022-10-11 DIAGNOSIS — R7301 Impaired fasting glucose: Secondary | ICD-10-CM | POA: Diagnosis not present

## 2022-10-11 DIAGNOSIS — M2550 Pain in unspecified joint: Secondary | ICD-10-CM | POA: Diagnosis not present

## 2022-10-11 DIAGNOSIS — R202 Paresthesia of skin: Secondary | ICD-10-CM | POA: Diagnosis not present

## 2022-10-17 ENCOUNTER — Ambulatory Visit: Payer: BC Managed Care – PPO | Attending: Internal Medicine | Admitting: Internal Medicine

## 2022-10-17 ENCOUNTER — Encounter: Payer: Self-pay | Admitting: Internal Medicine

## 2022-10-17 VITALS — Wt 178.0 lb

## 2022-10-17 DIAGNOSIS — E7801 Familial hypercholesterolemia: Secondary | ICD-10-CM | POA: Diagnosis not present

## 2022-10-17 DIAGNOSIS — E785 Hyperlipidemia, unspecified: Secondary | ICD-10-CM | POA: Diagnosis not present

## 2022-10-17 DIAGNOSIS — Z789 Other specified health status: Secondary | ICD-10-CM | POA: Diagnosis not present

## 2022-10-17 NOTE — Patient Instructions (Signed)
Medication Instructions:  NO CHANGES  *If you need a refill on your cardiac medications before your next appointment, please call your pharmacy*   Lab Work: FASTING lab work in 6 months to check cholesterol  ** complete about ONE WEEK before your next visit  If you have labs (blood work) drawn today and your tests are completely normal, you will receive your results only by: MyChart Message (if you have MyChart) OR A paper copy in the mail If you have any lab test that is abnormal or we need to change your treatment, we will call you to review the results.   Testing/Procedures: NONE   Follow-Up: At [facility], you and your health needs are our priority.  As part of our continuing mission to provide you with exceptional heart care, we have created designated Provider Care Teams.  These Care Teams include your primary Cardiologist (physician) and Advanced Practice Providers (APPs -  Physician Assistants and Nurse Practitioners) who all work together to provide you with the care you need, when you need it.  We recommend signing up for the patient portal called "MyChart".  Sign up information is provided on this After Visit Summary.  MyChart is used to connect with patients for Virtual Visits (Telemedicine).  Patients are able to view lab/test results, encounter notes, upcoming appointments, etc.  Non-urgent messages can be sent to your provider as well.   To learn more about what you can do with MyChart, go to [email or web address].    Your next appointment:    6 months with Dr. Rennis Golden -- in-person or video visit

## 2022-10-17 NOTE — Progress Notes (Signed)
Virtual Visit via Video Note   This visit type was conducted due to national recommendations for restrictions regarding the COVID-19 Pandemic (e.g. social distancing) in an effort to limit this patient's exposure and mitigate transmission in our community.  Due to her co-morbid illnesses, this patient is at least at moderate risk for complications without adequate follow up.  This format is felt to be most appropriate for this patient at this time.  All issues noted in this document were discussed and addressed.  A limited physical exam was performed with this format.  Please refer to the patient's chart for her consent to telehealth for [facility].   Evaluation Performed: Video follow-up  Date:  10/17/2022   ID:  [name] [name], DOB [birth year], MRN [id number]  Patient Location:  [address] [place] [address]  Provider location:   [address] [place], [place] [address]  PCP:  Thana Ates, MD  Cardiologist:  Chrystie Nose, MD Electrophysiologist:  None   Chief Complaint:  No complaints  History of Present Illness:    [name] [name] is a 62 y.o. female who presents via audio/video conferencing for a telehealth visit today.  Ms. [name] [name] was seen today for video follow-up.  Overall she is doing well on Repatha and has no significant side effects, compared to the side effect she had on statin therapy.  Please report she can has continued good control of her cholesterol.  Recent labs showed total cholesterol 160, triglycerides 239, HDL 60 and LDL of 52.  Her LDL 10 months ago was 119 with total cholesterol 220.  Her triglycerides have gone up somewhat accordingly, however given the fact she has no demonstrated coronary calcification, I think there is unfortunately little evidence supporting the addition of medications to lower her triglycerides.  While there is strong evidence for the use of Vascepa, and reducing cardiovascular events, this is in a patient  population with known coronary artery disease on top of statin or PCSK9 inhibitor therapy.  There is little evidence of cardiovascular event reduction or mortality benefit in patients with fibrate's.  Based on that, I am recommending dietary interventions including reduction in alcohol intake and weight loss with more exercise which has been an issue in the past several months per her report due to isolating from the COVID-19 pandemic.  04/04/2019  [name] [name] returns today for follow-up.  Overall she is doing well.  She says she has lost some weight and continues to eat healthy and exercise regularly.  She has been compliant with her Repatha and ezetimibe.  Spite this, there has been small increase in her cholesterol.  Her total cholesterol is at now 178, triglycerides 285, HDL 58 and LDL of 74.  07/14/2021  [name] [name] returns today for follow-up.  She continues to do well on combination of Repatha and ezetimibe.  Repeat lipids show total cholesterol 178, triglycerides 200, HDL 62 and LDL 83.  Although slightly higher triglycerides have improved.  Overall very favorable lipid profile.  She is very active and is asymptomatic denying chest pain or worsening shortness of breath.  Of note she had no coronary calcium back in 2017.  We have discussed possibility of repeating that next year.  04/05/2022  [name] [name] is seen today in follow-up. 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  She  reported it to [employer] who noted that others have reported it as well.  I also wonder if it could be vaccine related- she had had vaccines and COVID 19 as well around that time.  Nonetheless, it would be safest to pursue another option. She had repeat bloodwork in September - her TC was 279, TG 263, HDL 63 and LDL 167.  She is on zetia.  She has failed 4 statins due to cognitive  impairment.  10/17/2022  [name] [name] is seen today in follow-up. She is tolerating Leqvio much better - no injection site reactions. She had recent lipid testing through her PCP at [facility].    TC 156 HDL 66 LDL 61 TG 179  She did receive a letter saying that further coverage was denied. We will investigate that.  Prior CV studies:   The following studies were reviewed today:  Chart reviewed  PMHx:  Past Medical History:  Diagnosis Date   Abnormal Pap smear of cervix    51yrs ago   Classic migraine 09/18/2013   Diplopia 09/18/2013   Diverticulitis    Generalized headaches    Hyperlipidemia    Hypertension     Past Surgical History:  Procedure Laterality Date   ABDOMINAL HYSTERECTOMY     BREAST SURGERY     reduction   CERVICAL CONE BIOPSY     58yrs ago   CESAREAN SECTION     x 2   LAPAROSCOPIC APPENDECTOMY N/A 07/31/2014   Procedure: APPENDECTOMY LAPAROSCOPIC;  Surgeon: Ovidio Kin, MD;  Location: [facility];  Service: General;  Laterality: N/A;   REDUCTION MAMMAPLASTY     tendon surgery on elbow     TONSILLECTOMY     TUBAL LIGATION      FAMHx:  Family History  Problem Relation Age of Onset   Prostate cancer Father    Heart disease Father 58       CABGs x 3   Hypertension Father    Hyperlipidemia Sister    Hypertension Sister    Renal cancer Sister    Other Sister        bile duct cancer   Alcohol abuse Brother    Diabetes Brother        PVD   Hypertension Brother    Cancer Brother    Hypertension Brother    Hypertension Brother    Ovarian cancer Maternal Grandmother     SOCHx:   reports that she has never smoked. She has never used smokeless tobacco. She reports current alcohol use. She reports that she does not use drugs.  ALLERGIES:  Allergies  Allergen Reactions   Statins Other (See Comments)    Cognitive impairment - Atorvastatin, Rosuvastatin, Pravastatin, Simvastatin (high and low doses)   Other     Anything with venom   Ramipril Cough    Sulfa Antibiotics Other (See Comments)    Itching.   Yellow Jacket Venom [Bee Venom] Hives, Swelling and Rash    MEDS:  Current Meds  Medication Sig   acetaminophen (TYLENOL) 650 MG CR tablet Take 1 tablet (650 mg total) by mouth every 8 (eight) hours as needed for pain.   amLODipine (NORVASC) 5 MG tablet TAKE ONE TABLET BY MOUTH DAILY   Cyanocobalamin (VITAMIN B12) 1000 MCG TBCR daily.   ezetimibe (ZETIA) 10 MG tablet Take 1 tablet (10 mg total) by mouth daily. Please keep upcoming appt in March 2023 with Dr. Rennis Golden before anymore refills. Thank you Final Attempt   gabapentin (NEURONTIN) 100 MG  capsule Take 100 mg by mouth as needed.   hydrochlorothiazide (HYDRODIURIL) 25 MG tablet Take 1 tablet (25 mg total) by mouth daily.   inclisiran (LEQVIO) 284 MG/1.5ML SOSY injection Inject 284 mg into the skin as directed.   levothyroxine (SYNTHROID) 75 MCG tablet Take 75 mcg by mouth daily before breakfast.   losartan (COZAAR) 50 MG tablet Take 1 tablet (50 mg total) by mouth daily.   potassium chloride (KLOR-CON) 10 MEQ tablet TAKE ONE TABLET BY MOUTH DAILY   rizatriptan (MAXALT-MLT) 10 MG disintegrating tablet Take 1 tablet (10 mg total) by mouth 3 (three) times daily as needed for migraine.   topiramate (TOPAMAX) 50 MG tablet Take 1 tablet (50 mg total) by mouth 2 (two) times daily.   [DISCONTINUED] levothyroxine (SYNTHROID) 50 MCG tablet TAKE 1 TABLET BY MOUTH ONE TIME DAILY BEFORE BREAKFAST     ROS: Pertinent items noted in HPI and remainder of comprehensive ROS otherwise negative.  Labs/Other Tests and Data Reviewed:    Recent Labs: No results found for requested labs within last 365 days.   Recent Lipid Panel Lab Results  Component Value Date/Time   CHOL 218 (H) 05/22/2020 11:38 AM   CHOL 152 09/16/2019 09:19 AM   TRIG 298 (H) 05/22/2020 11:38 AM   HDL 64 05/22/2020 11:38 AM   HDL 54 09/16/2019 09:19 AM   CHOLHDL 3.4 05/22/2020 11:38 AM   LDLCALC 114 (H) 05/22/2020 11:38 AM    LDLDIRECT 130.7 11/27/2008 11:41 AM    Wt Readings from Last 3 Encounters:  10/17/22 178 lb (80.7 kg)  08/11/22 174 lb 9.6 oz (79.2 kg)  06/14/22 180 lb 8 oz (81.9 kg)     Exam:    Vital Signs:  Wt 178 lb (80.7 kg)   BMI 29.62 kg/m    General appearance: alert and no distress Lungs: No visual respiratory difficulty Abdomen: Normal weight Extremities: extremities normal, atraumatic, no cyanosis or edema Skin: Skin color, texture, turgor normal. 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  I had ordered an NMR And LP(a), but that was not drawn by her PCP - would advise she have these labs from [facility] prior to her next visit in 6 months.  COVID-19 Education: The signs and symptoms of COVID-19 were discussed with the patient and how to seek care for testing (follow up with PCP or arrange E-visit).  The importance of social distancing was discussed today.  Patient Risk:   After full review of this patients clinical status, I feel that they are at least moderate risk at this time.  Time:   Today, I have spent 15 minutes with the patient with telehealth technology discussing lipid management, dietary education, risk factor modification.     Medication Adjustments/Labs and Tests Ordered: Current medicines are reviewed at length with the patient today.  Concerns regarding medicines are outlined above.   Tests Ordered: Orders Placed This Encounter  Procedures   NMR, lipoprofile   Lipoprotein A (LPA)    Medication Changes: No orders of the defined types were placed in this encounter.   Disposition:  in 6 month(s)  Chrystie Nose, MD, [facility], FACP  [place]  [facility]  Medical Director  of the Advanced Lipid Disorders &  Cardiovascular Risk Reduction Clinic Diplomate of the American Board of Clinical Lipidology Attending Cardiologist  Direct Dial: [phone]  Fax: [phone]  Website:  [email or web address]  Chrystie Nose, MD  10/17/2022 10:12 AM

## 2022-10-26 NOTE — Telephone Encounter (Signed)
Patient has been approved for [employer] (free drug).   [facility] will be coordinating shipment of medication. Once medication is received we will reach out to schedule patient.  I have left a v/m with patient in regards to coverage and administration cost. Awaiting a call back.  Thanks Selena Batten

## 2022-10-26 NOTE — Telephone Encounter (Signed)
[name] [name], Patient is not due for another dose unitl 10/24.  We will reach out closer to her due date. Previous injections: 1. 05/11/22 2. 08/11/22 3. Next dose due 10/24.  Thanks Selena Batten

## 2022-11-18 DIAGNOSIS — E538 Deficiency of other specified B group vitamins: Secondary | ICD-10-CM | POA: Diagnosis not present

## 2022-12-30 ENCOUNTER — Other Ambulatory Visit: Payer: Self-pay | Admitting: Medical Genetics

## 2022-12-30 DIAGNOSIS — Z006 Encounter for examination for normal comparison and control in clinical research program: Secondary | ICD-10-CM

## 2023-02-09 ENCOUNTER — Ambulatory Visit: Payer: BC Managed Care – PPO

## 2023-02-09 VITALS — BP 121/85 | HR 62 | Temp 97.7°F | Resp 16 | Ht 65.0 in | Wt 174.6 lb

## 2023-02-09 DIAGNOSIS — E7801 Familial hypercholesterolemia: Secondary | ICD-10-CM | POA: Diagnosis not present

## 2023-02-09 DIAGNOSIS — E782 Mixed hyperlipidemia: Secondary | ICD-10-CM

## 2023-02-09 MED ORDER — INCLISIRAN SODIUM 284 MG/1.5ML ~~LOC~~ SOSY
284.0000 mg | PREFILLED_SYRINGE | Freq: Once | SUBCUTANEOUS | Status: AC
  Administered 2023-02-09: 284 mg via SUBCUTANEOUS
  Filled 2023-02-09: qty 1.5

## 2023-02-09 NOTE — Progress Notes (Signed)
Diagnosis: Hyperlipidemia  Provider:  Chilton Greathouse MD  Procedure: Injection  Leqvio (inclisiran), Dose: 284 mg, Site: subcutaneous, Number of injections: 1  Administered in right abdomen.  Post Care: Patient declined observation  Discharge: Condition: Good, Destination: Home . AVS Declined  Performed by:  Wyvonne Lenz, RN

## 2023-02-20 DIAGNOSIS — L720 Epidermal cyst: Secondary | ICD-10-CM | POA: Diagnosis not present

## 2023-02-20 DIAGNOSIS — D2272 Melanocytic nevi of left lower limb, including hip: Secondary | ICD-10-CM | POA: Diagnosis not present

## 2023-02-20 DIAGNOSIS — D225 Melanocytic nevi of trunk: Secondary | ICD-10-CM | POA: Diagnosis not present

## 2023-02-20 DIAGNOSIS — D2271 Melanocytic nevi of right lower limb, including hip: Secondary | ICD-10-CM | POA: Diagnosis not present

## 2023-03-10 ENCOUNTER — Encounter: Payer: Self-pay | Admitting: Internal Medicine

## 2023-03-16 DIAGNOSIS — H524 Presbyopia: Secondary | ICD-10-CM | POA: Diagnosis not present

## 2023-03-16 DIAGNOSIS — H2513 Age-related nuclear cataract, bilateral: Secondary | ICD-10-CM | POA: Diagnosis not present

## 2023-03-27 DIAGNOSIS — E538 Deficiency of other specified B group vitamins: Secondary | ICD-10-CM | POA: Diagnosis not present

## 2023-03-27 DIAGNOSIS — F322 Major depressive disorder, single episode, severe without psychotic features: Secondary | ICD-10-CM | POA: Diagnosis not present

## 2023-03-27 DIAGNOSIS — E039 Hypothyroidism, unspecified: Secondary | ICD-10-CM | POA: Diagnosis not present

## 2023-04-15 ENCOUNTER — Ambulatory Visit (HOSPITAL_BASED_OUTPATIENT_CLINIC_OR_DEPARTMENT_OTHER)
Admission: RE | Admit: 2023-04-15 | Discharge: 2023-04-15 | Disposition: A | Discharge status: Home / Self Care | Payer: BC Managed Care – PPO | Source: Ambulatory Visit | Attending: Internal Medicine | Admitting: Internal Medicine

## 2023-04-15 DIAGNOSIS — I77819 Aortic ectasia, unspecified site: Secondary | ICD-10-CM | POA: Diagnosis not present

## 2023-04-15 DIAGNOSIS — I700 Atherosclerosis of aorta: Secondary | ICD-10-CM | POA: Diagnosis not present

## 2023-04-15 DIAGNOSIS — I7121 Aneurysm of the ascending aorta, without rupture: Secondary | ICD-10-CM | POA: Diagnosis not present

## 2023-04-15 DIAGNOSIS — J9811 Atelectasis: Secondary | ICD-10-CM | POA: Diagnosis not present

## 2023-04-15 MED ORDER — IOHEXOL 350 MG/ML SOLN
75.0000 mL | Freq: Once | INTRAVENOUS | Status: AC | PRN
  Administered 2023-04-15: 75 mL via INTRAVENOUS

## 2023-04-16 LAB — POCT I-STAT CREATININE: Creatinine, Ser: 0.9 mg/dL (ref 0.44–1.00)

## 2023-04-18 DIAGNOSIS — K5792 Diverticulitis of intestine, part unspecified, without perforation or abscess without bleeding: Secondary | ICD-10-CM | POA: Diagnosis not present

## 2023-04-19 ENCOUNTER — Ambulatory Visit: Payer: BC Managed Care – PPO | Admitting: Internal Medicine

## 2023-05-01 ENCOUNTER — Other Ambulatory Visit (HOSPITAL_BASED_OUTPATIENT_CLINIC_OR_DEPARTMENT_OTHER): Payer: Self-pay | Admitting: *Deleted

## 2023-05-01 DIAGNOSIS — I77819 Aortic ectasia, unspecified site: Secondary | ICD-10-CM

## 2023-05-12 ENCOUNTER — Other Ambulatory Visit (HOSPITAL_COMMUNITY): Payer: Self-pay | Admitting: Internal Medicine

## 2023-05-12 ENCOUNTER — Ambulatory Visit (HOSPITAL_BASED_OUTPATIENT_CLINIC_OR_DEPARTMENT_OTHER)
Admission: RE | Admit: 2023-05-12 | Discharge: 2023-05-12 | Disposition: A | Discharge status: Home / Self Care | Payer: BC Managed Care – PPO | Source: Ambulatory Visit | Attending: Internal Medicine | Admitting: Internal Medicine

## 2023-05-12 DIAGNOSIS — K5792 Diverticulitis of intestine, part unspecified, without perforation or abscess without bleeding: Secondary | ICD-10-CM

## 2023-05-12 DIAGNOSIS — Z90710 Acquired absence of both cervix and uterus: Secondary | ICD-10-CM | POA: Diagnosis not present

## 2023-05-12 DIAGNOSIS — K5732 Diverticulitis of large intestine without perforation or abscess without bleeding: Secondary | ICD-10-CM | POA: Diagnosis not present

## 2023-05-12 MED ORDER — IOHEXOL 300 MG/ML  SOLN
100.0000 mL | Freq: Once | INTRAMUSCULAR | Status: AC | PRN
  Administered 2023-05-12: 100 mL via INTRAVENOUS

## 2023-05-22 DIAGNOSIS — E785 Hyperlipidemia, unspecified: Secondary | ICD-10-CM | POA: Diagnosis not present

## 2023-05-24 LAB — NMR, LIPOPROFILE
Cholesterol, Total: 211 mg/dL — ABNORMAL HIGH (ref 100–199)
HDL Particle Number: 44.2 umol/L (ref >=30.5)
HDL-C: 51 mg/dL (ref >39)
LDL Particle Number: 1212 nmol/L — ABNORMAL HIGH (ref <1000)
LDL Size: 19.7 nmol — ABNORMAL LOW (ref >20.5)
LDL-C (NIH Calc): 100 mg/dL — ABNORMAL HIGH (ref 0–99)
LP-IR Score: 62 — ABNORMAL HIGH (ref <=45)
Small LDL Particle Number: 803 nmol/L — ABNORMAL HIGH (ref <=527)
Triglycerides: 356 mg/dL — ABNORMAL HIGH (ref 0–149)

## 2023-05-24 LAB — LIPOPROTEIN A (LPA): Lipoprotein (a): 165.3 nmol/L — ABNORMAL HIGH (ref <75.0)

## 2023-06-06 NOTE — Progress Notes (Signed)
 Virtual Visit via Video Note   Because of [name] [name]'s co-morbid illnesses, she is at least at moderate risk for complications without adequate follow up.  This format is felt to be most appropriate for this patient at this time.  All issues noted in this document were discussed and addressed.  A limited physical exam was performed with this format.  Please refer to the patient's chart for her consent to telehealth for [facility].       Date:  06/07/2023   ID:  [name] [name], DOB 1960/05/23, MRN [id number] The patient was identified using 2 identifiers.  Patient Location: Home Provider Location: Office/Clinic   PCP:  Dwight Trula SQUIBB, MD   [facility] Providers Cardiologist:  Vinie JAYSON Maxcy, MD     Evaluation Performed:  Follow-Up Visit  Chief Complaint:  dyslipidemia and statin intolerance  History of Present Illness:    [name] [name] is a 63 y.o. female with past medical history of elevated lipoprotein a, dyslipidemia, possible heterozygous familial hyperlipidemia (Dutch score of 4) coronary calcium  score of 0 in November 2017 and in February 2024, premature CAD in her father who had MI and numerous bypasses starting in his late 16s, statin intolerance, hypertension, and thoracic aortic aneurysm.  She was referred to advanced lipid disorder clinic and seen by Dr. Maxcy 06/2017.  She had previously been seen by Dr. Lavona in 2017 for dyslipidemia and underwent a CT calcium  score which revealed CAC of 0.  She has significant family history in her father who had numerous bypasses starting in his late 14s.  She also had a brother with significant PAD who died prematurely, a sister who had early onset heart disease, and 2 other brothers who are on statin therapy for dyslipidemia.  Her most recent lipid profile at that time revealed total cholesterol 295, HDL 66, triglycerides 172, and LDL-C 195.  She reports eating a very healthy diet, mostly  consisting of vegetables.  She has unfortunately been intolerant of multiple statins with myalgias and cognitive impairment including atorvastatin, rosuvastatin , simvastatin, and pravastatin at both low and high doses.  She was started on Repatha  with improvement in LDL to 119.  She had inadvertently stopped ezetimibe  but was willing to resume.  At follow-up visit 03/2018 she was tolerating both Repatha  and ezetimibe  with total cholesterol down to 156, HDL 61, and LDL 45.  Triglycerides continued to fluctuate and were at 249 at that time.  In May 2021, she reported she had incorporated intermittent fasting and had improvement in triglyceride levels and A1c.  At follow-up visit 04/05/2022 via telemedicine with Dr. Maxcy, she reported she was off Repatha  after noting a significant reaction with development of peripheral neuropathy that was improving since stopping the medication.  She reported this to [employer] which had been reported by others as well.  It was felt safest to pursue another treatment option.  Lipid profile from September 2023 revealed total cholesterol 279, triglycerides 263, HDL 63, and LDL 167 on ezetimibe  alone.  She was advised to start Leqvio .  She eventually got approval for Leqvio  but unfortunately had skin rash following 2 injections. Her LP(a) is elevated. NMR results from 05/22/23 reveal LDL particle number [id number], LDL-C 100, HDL-C 51, triglycerides 356, total cholesterol 211, and small LDL particle number [id number].   Today, she reports she is overall feeling well.  She is somewhat limited from an activity perspective by her husband's recent cancer diagnosis.  She is walking 1  to 2 miles most days but is not able to participate in regular exercise classes.  We discussed the skin rash that she had following Leqvio  and injections which was present initially and got worse with subsequent injections.  She reports despite dietary changes and eating a very clean diet, that she is frustrated by weight  gain and cholesterol levels.  She inquires about GLP-1 agonist.  She says her brothers are managing their cholesterol much better despite less healthy lifestyle.  She denies any concerning cardiac symptoms including no chest pain, palpitations, shortness of breath, or syncope.   Past Medical History:  Diagnosis Date   Abnormal Pap smear of cervix    48yrs ago   Classic migraine 09/18/2013   Diplopia 09/18/2013   Diverticulitis    Generalized headaches    Hyperlipidemia    Hypertension    Past Surgical History:  Procedure Laterality Date   ABDOMINAL HYSTERECTOMY     BREAST SURGERY     reduction   CERVICAL CONE BIOPSY     8yrs ago   CESAREAN SECTION     x 2   LAPAROSCOPIC APPENDECTOMY N/A 07/31/2014   Procedure: APPENDECTOMY LAPAROSCOPIC;  Surgeon: Alm Angle, MD;  Location: [facility];  Service: General;  Laterality: N/A;   REDUCTION MAMMAPLASTY     tendon surgery on elbow     TONSILLECTOMY     TUBAL LIGATION       Current Meds  Medication Sig   acetaminophen  (TYLENOL ) 650 MG CR tablet Take 1 tablet (650 mg total) by mouth every 8 (eight) hours as needed for pain.   amLODipine  (NORVASC ) 5 MG tablet TAKE ONE TABLET BY MOUTH DAILY   Cyanocobalamin  (VITAMIN B12) 1000 MCG TBCR daily.   ezetimibe  (ZETIA ) 10 MG tablet Take 1 tablet (10 mg total) by mouth daily. Please keep upcoming appt in March 2023 with Dr. Mona before anymore refills. Thank you Final Attempt   FLUoxetine (PROZAC) 20 MG capsule Take 20 mg by mouth daily.   gabapentin (NEURONTIN) 100 MG capsule Take 100 mg by mouth as needed.   hydrochlorothiazide  (HYDRODIURIL ) 25 MG tablet Take 1 tablet (25 mg total) by mouth daily.   levothyroxine  (SYNTHROID ) 75 MCG tablet Take 75 mcg by mouth daily before breakfast.   losartan  (COZAAR ) 50 MG tablet Take 1 tablet (50 mg total) by mouth daily.   potassium chloride  (KLOR-CON ) 10 MEQ tablet TAKE ONE TABLET BY MOUTH DAILY   rizatriptan  (MAXALT -MLT) 10 MG disintegrating tablet Take 1  tablet (10 mg total) by mouth 3 (three) times daily as needed for migraine.   rosuvastatin  (CRESTOR ) 5 MG tablet Take 1 tablet (5 mg total) by mouth 3 (three) times a week.   topiramate  (TOPAMAX ) 50 MG tablet Take 1 tablet (50 mg total) by mouth 2 (two) times daily.   [DISCONTINUED] inclisiran (LEQVIO ) 284 MG/1.5ML SOSY injection Inject 284 mg into the skin as directed.     Allergies:   Leqvio  [inclisiran sodium ], Statins, Other, Ramipril, Sulfa antibiotics, and Yellow jacket venom [bee venom]   Social History   Tobacco Use   Smoking status: Never   Smokeless tobacco: Never  Substance Use Topics   Alcohol use: Yes    Comment: 0-3 a day   Drug use: No     Family Hx: The patient's family history includes Alcohol abuse in her brother; Cancer in her brother; Diabetes in her brother; Heart disease (age of onset: 70) in her father; Hyperlipidemia in her sister; Hypertension in her brother, brother,  brother, father, and sister; Other in her sister; Ovarian cancer in her maternal grandmother; Prostate cancer in her father; Renal cancer in her sister.  ROS:   Please see the history of present illness.   All other systems reviewed and are negative.   Prior CV studies:   The following studies were reviewed today:   Labs/Other Tests and Data Reviewed:    EKG:  No ECG reviewed.  Recent Labs: 04/15/2023: Creatinine, Ser 0.90   Recent Lipid Panel Lab Results  Component Value Date/Time   CHOL 218 (H) 05/22/2020 11:38 AM   CHOL 152 09/16/2019 09:19 AM   TRIG 298 (H) 05/22/2020 11:38 AM   HDL 64 05/22/2020 11:38 AM   HDL 54 09/16/2019 09:19 AM   CHOLHDL 3.4 05/22/2020 11:38 AM   LDLCALC 114 (H) 05/22/2020 11:38 AM   LDLDIRECT 130.7 11/27/2008 11:41 AM    Wt Readings from Last 3 Encounters:  02/09/23 174 lb 9.6 oz (79.2 kg)  10/17/22 178 lb (80.7 kg)  08/11/22 174 lb 9.6 oz (79.2 kg)     Risk Assessment/Calculations:          Objective:    Vital Signs:  Ht 5' 5 (1.651 m)    BMI 29.05 kg/m    VITAL SIGNS:  reviewed GEN:  no acute distress  ASSESSMENT & PLAN:    Dyslipidemia LDL goal < 70/Heterozygous familial hypercholesterolemia: History of intolerance to multiple statins, Repatha  and most recently to Leqvio . She is tolerating ezetimibe  without concerning side effects.  She reports it has been many years since she tried a statin and is unsure of which statins in particular caused certain symptoms, but was most concerned about cognitive impairment. We discussed potentially trying bempedoic acid but she would alternatively like to retry rosuvastatin  at a low dose as this has worked very well for her brothers who also have high cholesterol.  We will have her start rosuvastatin  5 mg 3 times a week and continue ezetimibe  10 mg daily.  We will retest NMR and ALT in 3 months.  Adverse reaction to Leqvio : She reports a skin rash developed following Leqvio  injection, worse after the second injection.  The rash was slow to resolve but has fully resolved now.  She does not wish to try another injection.   Thoracic aortic aneurysm: CTA 04/15/2023 reveals stable thoracic aorta measuring 4.3 cm.  No acute concerns today.  We will continue annual imaging for surveillance.  Weight gain: She is frustrated that despite healthy diet and regular exercise, she feels that she cannot lose 20 lbs. she asks about GLP-1 agonist for weight loss.  She realizes that insurance likely will not cover it.  We discussed alternative lifestyle modifications including a health coach, and increasing weight lifting in addition to regular physical activity like walking. She is limited with flexibility due to her husband's cancer diagnosis.  She would like to continue to work on lifestyle modification prior to starting GLP-1 agonist.  Asked her to contact us  if she decides to pursue this therapy.         Time:   Today, I have spent 25 minutes with the patient with telehealth technology discussing the  above problems.     Medication Adjustments/Labs and Tests Ordered: Current medicines are reviewed at length with the patient today.  Concerns regarding medicines are outlined above.   Tests Ordered: Orders Placed This Encounter  Procedures   NMR, lipoprofile   ALT    Medication Changes: Meds ordered this encounter  Medications   rosuvastatin  (CRESTOR ) 5 MG tablet    Sig: Take 1 tablet (5 mg total) by mouth 3 (three) times a week.    Dispense:  36 tablet    Refill:  3    Follow Up:  Virtual Visit  in 3 month(s)  Signed, Lyanne Kates, Rosaline HERO, NP  06/07/2023 5:02 PM    [facility]

## 2023-06-07 ENCOUNTER — Encounter (HOSPITAL_BASED_OUTPATIENT_CLINIC_OR_DEPARTMENT_OTHER): Payer: Self-pay | Admitting: Nurse Practitioner

## 2023-06-07 ENCOUNTER — Telehealth (HOSPITAL_BASED_OUTPATIENT_CLINIC_OR_DEPARTMENT_OTHER): Payer: BC Managed Care – PPO | Admitting: Nurse Practitioner

## 2023-06-07 ENCOUNTER — Other Ambulatory Visit (HOSPITAL_BASED_OUTPATIENT_CLINIC_OR_DEPARTMENT_OTHER): Payer: Self-pay | Admitting: *Deleted

## 2023-06-07 ENCOUNTER — Telehealth (HOSPITAL_BASED_OUTPATIENT_CLINIC_OR_DEPARTMENT_OTHER): Payer: Self-pay

## 2023-06-07 VITALS — Ht 65.0 in

## 2023-06-07 DIAGNOSIS — I77819 Aortic ectasia, unspecified site: Secondary | ICD-10-CM | POA: Diagnosis not present

## 2023-06-07 DIAGNOSIS — R635 Abnormal weight gain: Secondary | ICD-10-CM | POA: Diagnosis not present

## 2023-06-07 DIAGNOSIS — E785 Hyperlipidemia, unspecified: Secondary | ICD-10-CM | POA: Diagnosis not present

## 2023-06-07 DIAGNOSIS — E7801 Familial hypercholesterolemia: Secondary | ICD-10-CM

## 2023-06-07 DIAGNOSIS — E782 Mixed hyperlipidemia: Secondary | ICD-10-CM

## 2023-06-07 MED ORDER — ROSUVASTATIN CALCIUM 5 MG PO TABS: 5.0000 mg | ORAL_TABLET | ORAL | 3 refills | Status: DC

## 2023-06-07 NOTE — Telephone Encounter (Signed)
  Patient Consent for Virtual Visit         [name] [name] has provided verbal consent on 06/07/2023 for a virtual visit (video or telephone).   CONSENT FOR VIRTUAL VISIT FOR:  [name] [name]  By participating in this virtual visit I agree to the following:  I hereby voluntarily request, consent and authorize [facility] and its employed or contracted physicians, physician assistants, nurse practitioners or other licensed health care professionals (the Practitioner), to provide me with telemedicine health care services (the "Services) as deemed necessary by the treating Practitioner. I acknowledge and consent to receive the Services by the Practitioner via telemedicine. I understand that the telemedicine visit will involve communicating with the Practitioner through live audiovisual communication technology and the disclosure of certain medical information by electronic transmission. I acknowledge that I have been given the opportunity to request an in-person assessment or other available alternative prior to the telemedicine visit and am voluntarily participating in the telemedicine visit.  I understand that I have the right to withhold or withdraw my consent to the use of telemedicine in the course of my care at any time, without affecting my right to future care or treatment, and that the Practitioner or I may terminate the telemedicine visit at any time. I understand that I have the right to inspect all information obtained and/or recorded in the course of the telemedicine visit and may receive copies of available information for a reasonable fee.  I understand that some of the potential risks of receiving the Services via telemedicine include:  Delay or interruption in medical evaluation due to technological equipment failure or disruption; Information transmitted may not be sufficient (e.g. poor resolution of images) to allow for appropriate medical decision making by the  Practitioner; and/or  In rare instances, security protocols could fail, causing a breach of personal health information.  Furthermore, I acknowledge that it is my responsibility to provide information about my medical history, conditions and care that is complete and accurate to the best of my ability. I acknowledge that Practitioner's advice, recommendations, and/or decision may be based on factors not within their control, such as incomplete or inaccurate data provided by me or distortions of diagnostic images or specimens that may result from electronic transmissions. I understand that the practice of medicine is not an exact science and that Practitioner makes no warranties or guarantees regarding treatment outcomes. I acknowledge that a copy of this consent can be made available to me via my patient portal [facility] MyChart), or I can request a printed copy by calling the office of [facility].    I understand that my insurance will be billed for this visit.   I have read or had this consent read to me. I understand the contents of this consent, which adequately explains the benefits and risks of the Services being provided via telemedicine.  I have been provided ample opportunity to ask questions regarding this consent and the Services and have had my questions answered to my satisfaction. I give my informed consent for the services to be provided through the use of telemedicine in my medical care

## 2023-06-07 NOTE — Patient Instructions (Addendum)
 Medication Instructions:   START Rosuvastatin  one (1) tablet by mouth ( 5 mg) three times weekly.  *If you need a refill on your cardiac medications before your next appointment, please call your pharmacy*   Lab Work:  Mailed your copy to get fasting lab work and any lab corp location fasting after midnight.   If you have labs (blood work) drawn today and your tests are completely normal, you will receive your results only by: MyChart Message (if you have MyChart) OR A paper copy in the mail If you have any lab test that is abnormal or we need to change your treatment, we will call you to review the results.   Testing/Procedures:  None ordered.   Follow-Up: At [facility], you and your health needs are our priority.  As part of our continuing mission to provide you with exceptional heart care, we have created designated Provider Care Teams.  These Care Teams include your primary Cardiologist (physician) and Advanced Practice Providers (APPs -  Physician Assistants and Nurse Practitioners) who all work together to provide you with the care you need, when you need it.  We recommend signing up for the patient portal called MyChart.  Sign up information is provided on this After Visit Summary.  MyChart is used to connect with patients for Virtual Visits (Telemedicine).  Patients are able to view lab/test results, encounter notes, upcoming appointments, etc.  Non-urgent messages can be sent to your provider as well.   To learn more about what you can do with MyChart, go to [email or web address].    Your next appointment:   3 month(s)  Provider:   Rosaline Bane, NP  with a mychart visit.

## 2023-06-15 ENCOUNTER — Ambulatory Visit: Payer: BC Managed Care – PPO | Admitting: Neurology

## 2023-06-15 ENCOUNTER — Encounter: Payer: Self-pay | Admitting: Neurology

## 2023-06-15 DIAGNOSIS — K5792 Diverticulitis of intestine, part unspecified, without perforation or abscess without bleeding: Secondary | ICD-10-CM | POA: Diagnosis not present

## 2023-06-15 DIAGNOSIS — Z860100 Personal history of colon polyps, unspecified: Secondary | ICD-10-CM | POA: Diagnosis not present

## 2023-06-16 ENCOUNTER — Telehealth: Payer: Self-pay

## 2023-06-16 NOTE — Telephone Encounter (Signed)
[facility] mail order pharmacy is stating that pt has an allergy to statins and has been prescribed Rosuvastatin 5 mg tablets. Is [name] Bridegroom, NP aware of the allergy/potential cross-sensitivity and would like to prescribe medication? Please address  Ph# [phone]  Order# [id number]

## 2023-06-19 NOTE — Telephone Encounter (Signed)
 Please advise [facility] that I am aware of the stated allergy and the patient has elected to try low dose rosuvastatin as prescribed.

## 2023-06-20 ENCOUNTER — Other Ambulatory Visit: Payer: Self-pay | Admitting: Internal Medicine

## 2023-06-20 DIAGNOSIS — Z1231 Encounter for screening mammogram for malignant neoplasm of breast: Secondary | ICD-10-CM

## 2023-06-30 DIAGNOSIS — E039 Hypothyroidism, unspecified: Secondary | ICD-10-CM | POA: Diagnosis not present

## 2023-06-30 DIAGNOSIS — I10 Essential (primary) hypertension: Secondary | ICD-10-CM | POA: Diagnosis not present

## 2023-06-30 DIAGNOSIS — Z0000 Encounter for general adult medical examination without abnormal findings: Secondary | ICD-10-CM | POA: Diagnosis not present

## 2023-06-30 DIAGNOSIS — Z79899 Other long term (current) drug therapy: Secondary | ICD-10-CM | POA: Diagnosis not present

## 2023-06-30 DIAGNOSIS — R7301 Impaired fasting glucose: Secondary | ICD-10-CM | POA: Diagnosis not present

## 2023-06-30 DIAGNOSIS — E782 Mixed hyperlipidemia: Secondary | ICD-10-CM | POA: Diagnosis not present

## 2023-07-24 NOTE — Telephone Encounter (Signed)
 Called pharmacy and they stated that this matter was already taken care of and pt has her medication.

## 2023-07-25 ENCOUNTER — Ambulatory Visit
Admission: RE | Admit: 2023-07-25 | Discharge: 2023-07-25 | Disposition: A | Discharge status: Home / Self Care | Payer: BC Managed Care – PPO | Source: Ambulatory Visit | Attending: Internal Medicine | Admitting: Internal Medicine

## 2023-07-25 DIAGNOSIS — Z1231 Encounter for screening mammogram for malignant neoplasm of breast: Secondary | ICD-10-CM

## 2023-08-09 ENCOUNTER — Telehealth: Payer: Self-pay | Admitting: Pharmacy Technician

## 2023-08-09 NOTE — Telephone Encounter (Signed)
 Auth Submission: APPROVED Site of care: Site of care: [facility] Payer: [employer] AN[employer]M Medication & CPT/J Code(s) submitted: Leqvio (Inclisiran) [phone] Route of submission (phone, fax, portal): FAXED Phone # Fax # Auth type: Buy/Bill PB Units/visits requested: 284MG  Q6 MONTHS X 2 DOSES Reference number: [id number] UM ID: [id number] Approval from: 08/01/23 to 07/31/24

## 2023-08-14 DIAGNOSIS — K5792 Diverticulitis of intestine, part unspecified, without perforation or abscess without bleeding: Secondary | ICD-10-CM | POA: Diagnosis not present

## 2023-08-15 ENCOUNTER — Other Ambulatory Visit: Payer: Self-pay | Admitting: Neurology

## 2023-08-15 ENCOUNTER — Ambulatory Visit: Payer: BC Managed Care – PPO

## 2023-09-04 DIAGNOSIS — R197 Diarrhea, unspecified: Secondary | ICD-10-CM | POA: Diagnosis not present

## 2023-09-11 ENCOUNTER — Telehealth (HOSPITAL_BASED_OUTPATIENT_CLINIC_OR_DEPARTMENT_OTHER): Payer: BC Managed Care – PPO | Admitting: Nurse Practitioner

## 2023-10-02 ENCOUNTER — Encounter (HOSPITAL_BASED_OUTPATIENT_CLINIC_OR_DEPARTMENT_OTHER): Payer: Self-pay | Admitting: Nurse Practitioner

## 2023-10-02 ENCOUNTER — Telehealth: Payer: Self-pay

## 2023-10-02 ENCOUNTER — Telehealth (INDEPENDENT_AMBULATORY_CARE_PROVIDER_SITE_OTHER): Payer: BC Managed Care – PPO | Admitting: Nurse Practitioner

## 2023-10-02 VITALS — BP 135/90 | HR 80 | Ht 65.0 in

## 2023-10-02 DIAGNOSIS — I77819 Aortic ectasia, unspecified site: Secondary | ICD-10-CM | POA: Diagnosis not present

## 2023-10-02 DIAGNOSIS — Z8679 Personal history of other diseases of the circulatory system: Secondary | ICD-10-CM

## 2023-10-02 DIAGNOSIS — E785 Hyperlipidemia, unspecified: Secondary | ICD-10-CM

## 2023-10-02 DIAGNOSIS — E7801 Familial hypercholesterolemia: Secondary | ICD-10-CM

## 2023-10-02 DIAGNOSIS — Z889 Allergy status to unspecified drugs, medicaments and biological substances status: Secondary | ICD-10-CM

## 2023-10-02 DIAGNOSIS — Z789 Other specified health status: Secondary | ICD-10-CM

## 2023-10-02 NOTE — Progress Notes (Signed)
 Virtual Visit via Video Note   Because of [name] [name]'s co-morbid illnesses, she is at least at moderate risk for complications without adequate follow up.  This format is felt to be most appropriate for this patient at this time.  All issues noted in this document were discussed and addressed.  A limited physical exam was performed with this format.  Please refer to the patient's chart for her consent to telehealth for [facility].       Date:  10/02/2023   ID:  [name] [name], DOB 08-04-1960, MRN [id number] The patient was identified using 2 identifiers.  Patient Location: Home Provider Location: Office/Clinic   PCP:  Tena Feeling, MD   [facility] Providers Cardiologist:  Hazle Lites, MD     Evaluation Performed:  Follow-Up Visit  Chief Complaint:  dyslipidemia and statin intolerance  History of Present Illness:    [name] [name] is a 63 y.o. 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  She was referred to advanced lipid disorder clinic and seen by Dr. Maximo Spar 06/2017.  She had previously been seen by Dr. Lavonne Prairie in 2017 for dyslipidemia and underwent a CT calcium  score which revealed CAC of 0.  She has significant family history in her father who had numerous bypasses starting in his late 61s.  She also had a brother with significant PAD who died prematurely, a sister who had early onset heart disease, and 2 other brothers who are on statin therapy for dyslipidemia.  Her most recent lipid profile at that time revealed total cholesterol 295, HDL 66, triglycerides 172, and LDL-C 195.  She reports eating a very healthy diet, mostly  consisting of vegetables.  She has unfortunately been intolerant of multiple statins with myalgias and cognitive impairment including atorvastatin, rosuvastatin , simvastatin, and pravastatin at both low and high doses.  She was started on Repatha  with improvement in LDL to 119.  She had inadvertently stopped ezetimibe  but was willing to resume.  At follow-up visit 03/2018 she was tolerating both Repatha  and ezetimibe  with total cholesterol down to 156, HDL 61, and LDL 45.  Triglycerides continued to fluctuate and were at 249 at that time.  In May 2021, she reported she had incorporated intermittent fasting and had improvement in triglyceride levels and A1c.  At follow-up visit 04/05/2022 via telemedicine with Dr. Maximo Spar, she reported she was off Repatha  after noting a significant reaction with development of peripheral neuropathy that was improving since stopping the medication.  She reported this to [employer] which had been reported by others as well.  It was felt safest to pursue another treatment option.  Lipid profile from September 2023 revealed total cholesterol 279, triglycerides 263, HDL 63, and LDL 167 on ezetimibe  alone.  She was advised to start Leqvio .  She eventually got approval for Leqvio  but unfortunately had skin rash following 2 injections. Her LP(a) is elevated. NMR results from 05/22/23 reveal LDL particle number [id number], LDL-C 100, HDL-C 51, triglycerides 356, total cholesterol 211, and small LDL particle number [id number].   MyChart video visit 06/07/23 with me. She is somewhat limited from an activity perspective by her husband's recent cancer diagnosis.  She is walking 1 to 2 miles  most days but is not able to participate in regular exercise classes.  We discussed the skin rash that she had following Leqvio  and injections which was present initially and got worse with subsequent injections.  She reports despite dietary changes and eating a very clean diet, that she is frustrated by weight gain and  cholesterol levels.  She inquires about GLP-1 agonist.  She says her brothers are managing their cholesterol much better despite less healthy lifestyle. No concerning cardiac symptoms.  She was tolerating ezetimibe  without any concerning side effects.  After lengthy discussion, she wanted to restart rosuvastatin  5 mg 3 days weekly and have repeat lipid testing in 3 months.  Today, we are meeting via MyChart. I discussed the use of AI scribe software for clinical note transcription with the patient, who gave verbal consent to proceed. She reports that she took rosuvastatin  for approximately 3 to 4 weeks and experienced cognitive difficulties, including issues with word retrieval, while on rosuvastatin , leading to its discontinuation. She is currently taking ezetimibe  and has started over-the-counter red yeast rice. She has not had lab work due to a recent COVID-19 infection and travel from [place]. She denies chest pain, shortness of breath, or palpitations.    Past Medical History:  Diagnosis Date   Abnormal Pap smear of cervix    23yrs ago   Classic migraine 09/18/2013   Diplopia 09/18/2013   Diverticulitis    Generalized headaches    Hyperlipidemia    Hypertension    Past Surgical History:  Procedure Laterality Date   ABDOMINAL HYSTERECTOMY     BREAST SURGERY     reduction   CERVICAL CONE BIOPSY     22yrs ago   CESAREAN SECTION     x 2   LAPAROSCOPIC APPENDECTOMY N/A 07/31/2014   Procedure: APPENDECTOMY LAPAROSCOPIC;  Surgeon: Juanita Norlander, MD;  Location: [facility];  Service: General;  Laterality: N/A;   REDUCTION MAMMAPLASTY     tendon surgery on elbow     TONSILLECTOMY     TUBAL LIGATION       Current Meds  Medication Sig   acetaminophen  (TYLENOL ) 650 MG CR tablet Take 1 tablet (650 mg total) by mouth every 8 (eight) hours as needed for pain.   amLODipine  (NORVASC ) 5 MG tablet TAKE ONE TABLET BY MOUTH DAILY   Cyanocobalamin  (VITAMIN B12) 1000 MCG TBCR daily.   ezetimibe  (ZETIA ) 10  MG tablet Take 1 tablet (10 mg total) by mouth daily. Please keep upcoming appt in March 2023 with Dr. Maximo Spar before anymore refills. Thank you Final Attempt   FLUoxetine (PROZAC) 20 MG capsule Take 20 mg by mouth daily.   gabapentin (NEURONTIN) 100 MG capsule Take 100 mg by mouth as needed.   levothyroxine  (SYNTHROID ) 75 MCG tablet Take 75 mcg by mouth daily before breakfast.   losartan  (COZAAR ) 100 MG tablet Take 100 mg by mouth daily.   potassium chloride  (KLOR-CON ) 10 MEQ tablet TAKE ONE TABLET BY MOUTH DAILY   rizatriptan  (MAXALT -MLT) 10 MG disintegrating tablet Take 1 tablet (10 mg total) by mouth 3 (three) times daily as needed for migraine.   topiramate  (TOPAMAX ) 50 MG tablet TAKE 1 TABLET BY MOUTH TWICE  DAILY     Allergies:   Leqvio  [inclisiran sodium ], Statins, Ace inhibitors, Evolocumab , Other, Ramipril, Sulfa antibiotics, and Yellow jacket venom [bee venom]   Social History   Tobacco Use   Smoking status: Never   Smokeless tobacco: Never  Substance Use Topics   Alcohol use: Yes  Comment: 0-3 a day   Drug use: No     Family Hx: The patient's family history includes Alcohol abuse in her brother; Cancer in her brother; Diabetes in her brother; Heart disease (age of onset: 56) in her father; Hyperlipidemia in her sister; Hypertension in her brother, brother, brother, father, and sister; Other in her sister; Ovarian cancer in her maternal grandmother; Prostate cancer in her father; Renal cancer in her sister.  ROS:   Please see the history of present illness.   All other systems reviewed and are negative.   Prior CV studies:   The following studies were reviewed today:   Labs/Other Tests and Data Reviewed:    EKG:  No ECG reviewed.  Recent Labs: 04/15/2023: Creatinine, Ser 0.90   Recent Lipid Panel Lab Results  Component Value Date/Time   CHOL 218 (H) 05/22/2020 11:38 AM   CHOL 152 09/16/2019 09:19 AM   TRIG 298 (H) 05/22/2020 11:38 AM   HDL 64 05/22/2020 11:38  AM   HDL 54 09/16/2019 09:19 AM   CHOLHDL 3.4 05/22/2020 11:38 AM   LDLCALC 114 (H) 05/22/2020 11:38 AM   LDLDIRECT 130.7 11/27/2008 11:41 AM    Wt Readings from Last 3 Encounters:  02/09/23 174 lb 9.6 oz (79.2 kg)  10/17/22 178 lb (80.7 kg)  08/11/22 174 lb 9.6 oz (79.2 kg)     Risk Assessment/Calculations:          Objective:    Vital Signs:  BP (!) 135/90 (Cuff Size: Normal)   Pulse 80   Ht 5\' 5"  (1.651 m)   BMI 29.05 kg/m    VITAL SIGNS:  reviewed GEN:  no acute distress  ASSESSMENT & PLAN:    Dyslipidemia LDL goal < 70/Heterozygous familial hypercholesterolemia: History of intolerance to multiple statins, Repatha  and most recently to Leqvio . No concerning side effects on ezetimibe . Recent trial of rosuvastatin  5 mg 3 x per week which she did not tolerate due to cognitive delays. Consider pitavastatin. We also discussed potential trial of bempedoic acid. She does not have history of gout. She is recovering from COVID infection, but will get fasting labs as soon as possible which will help direct our recommendations. Focus on secondary prevention including heart healthy mostly plant based diet avoiding saturated fat, processed foods, simple carbohydrates, and sugar along with aiming for at least 150 minutes of moderate intensity exercise each week.   Statin intolerance: Recent trial of rosuvastatin  5 mg 3 days/week which she did not tolerate due to cognitive concerns. Felt like she could not find the appropriate words when needed.  We discussed consideration of pitavastatin. Will await lipid results for further management recommendations.   Adverse reaction to Leqvio : Skin rash developed following Leqvio  injection, worse after the second injection. The rash was slow to resolve but has fully resolved now.  She does not wish to try another injection.   Thoracic aortic aneurysm: CTA 04/15/2023 reveals stable thoracic aorta measuring 4.3 cm.  No acute concerns today. Chest CTA is  ordered, will need to be scheduled for 04/2024.       Time:   Today, I have spent 15 minutes with the patient with telehealth technology discussing the above problems.    Follow Up:  TBD following lab results  Signed, Gerldine Koch, NP  10/02/2023 4:36 PM    [facility]

## 2023-10-02 NOTE — Telephone Encounter (Signed)
  Patient Consent for Virtual Visit         [name] [name] has provided verbal consent on 10/02/2023 for a virtual visit (video or telephone).   CONSENT FOR VIRTUAL VISIT FOR:  [name] [name]  By participating in this virtual visit I agree to the following:  I hereby voluntarily request, consent and authorize [facility] and its employed or contracted physicians, physician assistants, nurse practitioners or other licensed health care professionals (the Practitioner), to provide me with telemedicine health care services (the "Services") as deemed necessary by the treating Practitioner. I acknowledge and consent to receive the Services by the Practitioner via telemedicine. I understand that the telemedicine visit will involve communicating with the Practitioner through live audiovisual communication technology and the disclosure of certain medical information by electronic transmission. I acknowledge that I have been given the opportunity to request an in-person assessment or other available alternative prior to the telemedicine visit and am voluntarily participating in the telemedicine visit.  I understand that I have the right to withhold or withdraw my consent to the use of telemedicine in the course of my care at any time, without affecting my right to future care or treatment, and that the Practitioner or I may terminate the telemedicine visit at any time. I understand that I have the right to inspect all information obtained and/or recorded in the course of the telemedicine visit and may receive copies of available information for a reasonable fee.  I understand that some of the potential risks of receiving the Services via telemedicine include:  Delay or interruption in medical evaluation due to technological equipment failure or disruption; Information transmitted may not be sufficient (e.g. poor resolution of images) to allow for appropriate medical decision making by the  Practitioner; and/or  In rare instances, security protocols could fail, causing a breach of personal health information.  Furthermore, I acknowledge that it is my responsibility to provide information about my medical history, conditions and care that is complete and accurate to the best of my ability. I acknowledge that Practitioner's advice, recommendations, and/or decision may be based on factors not within their control, such as incomplete or inaccurate data provided by me or distortions of diagnostic images or specimens that may result from electronic transmissions. I understand that the practice of medicine is not an exact science and that Practitioner makes no warranties or guarantees regarding treatment outcomes. I acknowledge that a copy of this consent can be made available to me via my patient portal [facility] MyChart), or I can request a printed copy by calling the office of [facility].    I understand that my insurance will be billed for this visit.   I have read or had this consent read to me. I understand the contents of this consent, which adequately explains the benefits and risks of the Services being provided via telemedicine.  I have been provided ample opportunity to ask questions regarding this consent and the Services and have had my questions answered to my satisfaction. I give my informed consent for the services to be provided through the use of telemedicine in my medical care

## 2023-10-02 NOTE — Telephone Encounter (Signed)
 [name] [name]

## 2023-12-06 DIAGNOSIS — M25511 Pain in right shoulder: Secondary | ICD-10-CM | POA: Diagnosis not present

## 2023-12-06 DIAGNOSIS — M25512 Pain in left shoulder: Secondary | ICD-10-CM | POA: Diagnosis not present

## 2023-12-08 DIAGNOSIS — M7502 Adhesive capsulitis of left shoulder: Secondary | ICD-10-CM | POA: Diagnosis not present

## 2023-12-20 ENCOUNTER — Other Ambulatory Visit: Payer: Self-pay | Admitting: Nurse Practitioner

## 2023-12-20 DIAGNOSIS — E782 Mixed hyperlipidemia: Secondary | ICD-10-CM | POA: Diagnosis not present

## 2023-12-20 DIAGNOSIS — E7801 Familial hypercholesterolemia: Secondary | ICD-10-CM | POA: Diagnosis not present

## 2023-12-20 DIAGNOSIS — M25512 Pain in left shoulder: Secondary | ICD-10-CM | POA: Diagnosis not present

## 2023-12-20 DIAGNOSIS — E785 Hyperlipidemia, unspecified: Secondary | ICD-10-CM | POA: Diagnosis not present

## 2023-12-21 ENCOUNTER — Ambulatory Visit: Payer: Self-pay | Admitting: Nurse Practitioner

## 2023-12-21 LAB — NMR, LIPOPROFILE
Cholesterol, Total: 232 mg/dL — ABNORMAL HIGH (ref 100–199)
HDL Particle Number: 36.7 umol/L (ref >=30.5)
HDL-C: 52 mg/dL (ref >39)
LDL Particle Number: 1724 nmol/L — ABNORMAL HIGH (ref <1000)
LDL Size: 20.1 nm — ABNORMAL LOW (ref >20.5)
LDL-C (NIH Calc): 133 mg/dL — ABNORMAL HIGH (ref 0–99)
LP-IR Score: 79 — ABNORMAL HIGH (ref <=45)
Small LDL Particle Number: 944 nmol/L — ABNORMAL HIGH (ref <=527)
Triglycerides: 266 mg/dL — ABNORMAL HIGH (ref 0–149)

## 2023-12-21 LAB — ALT: ALT: 26 IU/L (ref 0–32)

## 2023-12-21 LAB — SPECIMEN STATUS REPORT

## 2024-03-12 DIAGNOSIS — K635 Polyp of colon: Secondary | ICD-10-CM | POA: Diagnosis not present

## 2024-03-12 DIAGNOSIS — K5732 Diverticulitis of large intestine without perforation or abscess without bleeding: Secondary | ICD-10-CM | POA: Diagnosis not present

## 2024-03-12 DIAGNOSIS — K5730 Diverticulosis of large intestine without perforation or abscess without bleeding: Secondary | ICD-10-CM | POA: Diagnosis not present

## 2024-03-12 DIAGNOSIS — R1032 Left lower quadrant pain: Secondary | ICD-10-CM | POA: Diagnosis not present

## 2024-03-12 DIAGNOSIS — R933 Abnormal findings on diagnostic imaging of other parts of digestive tract: Secondary | ICD-10-CM | POA: Diagnosis not present
